# Patient Record
Sex: Male | Born: 1955 | Race: White | Hispanic: No | Marital: Married | Smoking: Former smoker
Health system: Southern US, Community
[De-identification: ages and names within clinical notes are randomized; demographics above are authoritative.]

## PROBLEM LIST (undated history)

## (undated) DIAGNOSIS — E119 Type 2 diabetes mellitus without complications: Secondary | ICD-10-CM

## (undated) DIAGNOSIS — K7460 Unspecified cirrhosis of liver: Secondary | ICD-10-CM

## (undated) DIAGNOSIS — I70219 Atherosclerosis of native arteries of extremities with intermittent claudication, unspecified extremity: Secondary | ICD-10-CM

## (undated) DIAGNOSIS — M1990 Unspecified osteoarthritis, unspecified site: Secondary | ICD-10-CM

## (undated) DIAGNOSIS — F329 Major depressive disorder, single episode, unspecified: Secondary | ICD-10-CM

## (undated) DIAGNOSIS — I10 Essential (primary) hypertension: Secondary | ICD-10-CM

## (undated) DIAGNOSIS — J40 Bronchitis, not specified as acute or chronic: Secondary | ICD-10-CM

## (undated) DIAGNOSIS — T8859XA Other complications of anesthesia, initial encounter: Secondary | ICD-10-CM

## (undated) DIAGNOSIS — Z8489 Family history of other specified conditions: Secondary | ICD-10-CM

## (undated) DIAGNOSIS — I5022 Chronic systolic (congestive) heart failure: Secondary | ICD-10-CM

## (undated) DIAGNOSIS — K219 Gastro-esophageal reflux disease without esophagitis: Secondary | ICD-10-CM

## (undated) DIAGNOSIS — T884XXA Failed or difficult intubation, initial encounter: Secondary | ICD-10-CM

## (undated) DIAGNOSIS — Z8614 Personal history of Methicillin resistant Staphylococcus aureus infection: Secondary | ICD-10-CM

## (undated) DIAGNOSIS — E785 Hyperlipidemia, unspecified: Secondary | ICD-10-CM

## (undated) DIAGNOSIS — F32A Depression, unspecified: Secondary | ICD-10-CM

## (undated) DIAGNOSIS — I2510 Atherosclerotic heart disease of native coronary artery without angina pectoris: Secondary | ICD-10-CM

## (undated) DIAGNOSIS — G4700 Insomnia, unspecified: Secondary | ICD-10-CM

## (undated) DIAGNOSIS — D649 Anemia, unspecified: Secondary | ICD-10-CM

## (undated) DIAGNOSIS — I209 Angina pectoris, unspecified: Secondary | ICD-10-CM

## (undated) DIAGNOSIS — R0600 Dyspnea, unspecified: Secondary | ICD-10-CM

## (undated) DIAGNOSIS — I509 Heart failure, unspecified: Secondary | ICD-10-CM

## (undated) DIAGNOSIS — Z974 Presence of external hearing-aid: Secondary | ICD-10-CM

## (undated) DIAGNOSIS — I255 Ischemic cardiomyopathy: Secondary | ICD-10-CM

## (undated) DIAGNOSIS — I739 Peripheral vascular disease, unspecified: Secondary | ICD-10-CM

## (undated) DIAGNOSIS — K259 Gastric ulcer, unspecified as acute or chronic, without hemorrhage or perforation: Secondary | ICD-10-CM

## (undated) DIAGNOSIS — Z9889 Other specified postprocedural states: Secondary | ICD-10-CM

## (undated) DIAGNOSIS — Z951 Presence of aortocoronary bypass graft: Secondary | ICD-10-CM

## (undated) HISTORY — PX: COLON SURGERY: SHX602

## (undated) HISTORY — DX: Family history of other specified conditions: Z84.89

## (undated) HISTORY — PX: OTHER SURGICAL HISTORY: SHX169

## (undated) HISTORY — DX: Other complications of anesthesia, initial encounter: T88.59XA

## (undated) HISTORY — PX: HAND TENDON SURGERY: SHX663

## (undated) HISTORY — PX: TONSILLECTOMY: SUR1361

## (undated) HISTORY — DX: Other specified postprocedural states: Z98.890

## (undated) HISTORY — PX: CORONARY ARTERY BYPASS GRAFT: SHX141

---

## 1999-09-12 ENCOUNTER — Encounter: Payer: Self-pay | Admitting: Emergency Medicine

## 1999-09-12 ENCOUNTER — Inpatient Hospital Stay (HOSPITAL_COMMUNITY): Admission: EM | Admit: 1999-09-12 | Discharge: 1999-09-16 | Payer: Self-pay | Admitting: Emergency Medicine

## 1999-10-22 ENCOUNTER — Ambulatory Visit (HOSPITAL_COMMUNITY): Admission: RE | Admit: 1999-10-22 | Discharge: 1999-10-22 | Payer: Self-pay | Admitting: Neurosurgery

## 1999-10-22 ENCOUNTER — Encounter: Payer: Self-pay | Admitting: Surgery

## 2001-02-18 ENCOUNTER — Encounter: Payer: Self-pay | Admitting: General Surgery

## 2001-02-18 ENCOUNTER — Inpatient Hospital Stay (HOSPITAL_COMMUNITY): Admission: EM | Admit: 2001-02-18 | Discharge: 2001-02-21 | Payer: Self-pay

## 2001-03-30 ENCOUNTER — Inpatient Hospital Stay (HOSPITAL_COMMUNITY): Admission: RE | Admit: 2001-03-30 | Discharge: 2001-04-04 | Payer: Self-pay | Admitting: Surgery

## 2001-03-30 ENCOUNTER — Encounter (INDEPENDENT_AMBULATORY_CARE_PROVIDER_SITE_OTHER): Payer: Self-pay

## 2002-01-26 ENCOUNTER — Encounter: Payer: Self-pay | Admitting: Emergency Medicine

## 2002-01-26 ENCOUNTER — Inpatient Hospital Stay (HOSPITAL_COMMUNITY): Admission: EM | Admit: 2002-01-26 | Discharge: 2002-01-27 | Payer: Self-pay | Admitting: Emergency Medicine

## 2002-11-25 HISTORY — PX: COLON SURGERY: SHX602

## 2004-12-03 ENCOUNTER — Inpatient Hospital Stay (HOSPITAL_COMMUNITY): Admission: EM | Admit: 2004-12-03 | Discharge: 2004-12-08 | Payer: Self-pay | Admitting: Emergency Medicine

## 2006-04-25 ENCOUNTER — Ambulatory Visit: Payer: Self-pay | Admitting: Family Medicine

## 2006-04-25 ENCOUNTER — Ambulatory Visit: Payer: Self-pay | Admitting: *Deleted

## 2006-05-15 ENCOUNTER — Ambulatory Visit: Payer: Self-pay | Admitting: Family Medicine

## 2006-06-11 ENCOUNTER — Ambulatory Visit: Payer: Self-pay | Admitting: Family Medicine

## 2006-08-12 ENCOUNTER — Ambulatory Visit: Payer: Self-pay | Admitting: Family Medicine

## 2006-09-15 ENCOUNTER — Ambulatory Visit: Payer: Self-pay | Admitting: Family Medicine

## 2006-09-15 ENCOUNTER — Ambulatory Visit (HOSPITAL_COMMUNITY): Admission: RE | Admit: 2006-09-15 | Discharge: 2006-09-15 | Payer: Self-pay | Admitting: Family Medicine

## 2007-02-16 ENCOUNTER — Ambulatory Visit: Payer: Self-pay | Admitting: Family Medicine

## 2007-04-06 ENCOUNTER — Ambulatory Visit: Payer: Self-pay | Admitting: Family Medicine

## 2007-04-24 ENCOUNTER — Ambulatory Visit: Payer: Self-pay | Admitting: Internal Medicine

## 2007-04-28 ENCOUNTER — Ambulatory Visit (HOSPITAL_COMMUNITY): Admission: RE | Admit: 2007-04-28 | Discharge: 2007-04-28 | Payer: Self-pay | Admitting: Family Medicine

## 2007-05-08 ENCOUNTER — Ambulatory Visit: Payer: Self-pay | Admitting: Family Medicine

## 2007-05-21 ENCOUNTER — Ambulatory Visit: Payer: Self-pay | Admitting: Internal Medicine

## 2007-07-08 ENCOUNTER — Emergency Department (HOSPITAL_COMMUNITY): Admission: EM | Admit: 2007-07-08 | Discharge: 2007-07-08 | Payer: Self-pay | Admitting: Emergency Medicine

## 2007-07-13 ENCOUNTER — Ambulatory Visit: Payer: Self-pay | Admitting: Internal Medicine

## 2007-07-22 ENCOUNTER — Ambulatory Visit: Payer: Self-pay | Admitting: Family Medicine

## 2007-07-23 DIAGNOSIS — M67919 Unspecified disorder of synovium and tendon, unspecified shoulder: Secondary | ICD-10-CM | POA: Insufficient documentation

## 2007-07-23 DIAGNOSIS — Z720 Tobacco use: Secondary | ICD-10-CM | POA: Insufficient documentation

## 2007-07-23 DIAGNOSIS — M25511 Pain in right shoulder: Secondary | ICD-10-CM | POA: Insufficient documentation

## 2007-07-23 DIAGNOSIS — M719 Bursopathy, unspecified: Secondary | ICD-10-CM

## 2007-07-23 DIAGNOSIS — I10 Essential (primary) hypertension: Secondary | ICD-10-CM | POA: Insufficient documentation

## 2007-07-23 DIAGNOSIS — F17200 Nicotine dependence, unspecified, uncomplicated: Secondary | ICD-10-CM

## 2007-07-23 DIAGNOSIS — K029 Dental caries, unspecified: Secondary | ICD-10-CM | POA: Insufficient documentation

## 2007-07-23 DIAGNOSIS — K219 Gastro-esophageal reflux disease without esophagitis: Secondary | ICD-10-CM | POA: Insufficient documentation

## 2007-07-24 DIAGNOSIS — I2510 Atherosclerotic heart disease of native coronary artery without angina pectoris: Secondary | ICD-10-CM | POA: Insufficient documentation

## 2007-08-12 ENCOUNTER — Encounter (INDEPENDENT_AMBULATORY_CARE_PROVIDER_SITE_OTHER): Payer: Self-pay | Admitting: *Deleted

## 2007-08-25 ENCOUNTER — Ambulatory Visit: Payer: Self-pay | Admitting: Family Medicine

## 2007-08-27 ENCOUNTER — Ambulatory Visit (HOSPITAL_COMMUNITY): Admission: RE | Admit: 2007-08-27 | Discharge: 2007-08-27 | Payer: Self-pay | Admitting: Family Medicine

## 2007-09-15 ENCOUNTER — Ambulatory Visit: Payer: Self-pay | Admitting: Family Medicine

## 2007-12-22 ENCOUNTER — Ambulatory Visit: Payer: Self-pay | Admitting: Family Medicine

## 2008-02-17 ENCOUNTER — Emergency Department (HOSPITAL_COMMUNITY): Admission: EM | Admit: 2008-02-17 | Discharge: 2008-02-17 | Payer: Self-pay | Admitting: Emergency Medicine

## 2008-04-01 ENCOUNTER — Ambulatory Visit: Payer: Self-pay | Admitting: Internal Medicine

## 2008-04-22 ENCOUNTER — Ambulatory Visit: Payer: Self-pay | Admitting: Internal Medicine

## 2008-05-23 ENCOUNTER — Ambulatory Visit: Payer: Self-pay | Admitting: Family Medicine

## 2008-05-23 LAB — CONVERTED CEMR LAB
ALT: 24 units/L (ref 0–53)
AST: 17 units/L (ref 0–37)
Albumin: 4.4 g/dL (ref 3.5–5.2)
Basophils Absolute: 0.1 10*3/uL (ref 0.0–0.1)
Basophils Relative: 1 % (ref 0–1)
Calcium: 8.8 mg/dL (ref 8.4–10.5)
Chloride: 107 meq/L (ref 96–112)
MCHC: 33.2 g/dL (ref 30.0–36.0)
Neutro Abs: 7.1 10*3/uL (ref 1.7–7.7)
Neutrophils Relative %: 58 % (ref 43–77)
PSA: 0.38 ng/mL (ref 0.10–4.00)
Potassium: 3.7 meq/L (ref 3.5–5.3)
RDW: 14 % (ref 11.5–15.5)
Total CHOL/HDL Ratio: 5.4

## 2008-11-25 DIAGNOSIS — Z8614 Personal history of Methicillin resistant Staphylococcus aureus infection: Secondary | ICD-10-CM

## 2008-11-25 HISTORY — DX: Personal history of Methicillin resistant Staphylococcus aureus infection: Z86.14

## 2009-04-18 ENCOUNTER — Ambulatory Visit: Payer: Self-pay | Admitting: Family Medicine

## 2009-09-22 ENCOUNTER — Ambulatory Visit: Payer: Self-pay | Admitting: Family Medicine

## 2009-09-22 LAB — CONVERTED CEMR LAB
ALT: 37 units/L (ref 0–53)
AST: 37 units/L (ref 0–37)
CO2: 20 meq/L (ref 19–32)
Chloride: 106 meq/L (ref 96–112)
PSA: 0.43 ng/mL (ref 0.10–4.00)
Sodium: 142 meq/L (ref 135–145)
Total Bilirubin: 0.6 mg/dL (ref 0.3–1.2)
Total Protein: 7.5 g/dL (ref 6.0–8.3)

## 2009-10-24 ENCOUNTER — Ambulatory Visit: Payer: Self-pay | Admitting: Family Medicine

## 2009-10-31 ENCOUNTER — Ambulatory Visit (HOSPITAL_COMMUNITY): Admission: RE | Admit: 2009-10-31 | Discharge: 2009-10-31 | Payer: Self-pay | Admitting: Family Medicine

## 2010-01-23 ENCOUNTER — Ambulatory Visit: Payer: Self-pay | Admitting: Family Medicine

## 2010-01-23 LAB — CONVERTED CEMR LAB
Amphetamine Screen, Ur: NEGATIVE
Barbiturate Quant, Ur: NEGATIVE
Cocaine Metabolites: NEGATIVE
Creatinine,U: 116.7 mg/dL
Marijuana Metabolite: POSITIVE — AB
Opiate Screen, Urine: NEGATIVE

## 2010-10-17 ENCOUNTER — Emergency Department: Payer: Medicare Other | Admitting: Emergency Medicine

## 2010-10-23 ENCOUNTER — Encounter (INDEPENDENT_AMBULATORY_CARE_PROVIDER_SITE_OTHER): Payer: Self-pay | Admitting: Family Medicine

## 2010-10-23 LAB — CONVERTED CEMR LAB
BUN: 16 mg/dL (ref 6–23)
CO2: 20 meq/L (ref 19–32)
Cholesterol: 165 mg/dL (ref 0–200)
Creatinine, Ser: 1.16 mg/dL (ref 0.40–1.50)
Glucose, Bld: 99 mg/dL (ref 70–99)
HDL: 26 mg/dL — ABNORMAL LOW (ref >39)
Total Bilirubin: 0.6 mg/dL (ref 0.3–1.2)
Total CHOL/HDL Ratio: 6.3
Triglycerides: 135 mg/dL (ref <150)
VLDL: 27 mg/dL (ref 0–40)

## 2010-11-29 ENCOUNTER — Emergency Department: Payer: Medicare Other | Admitting: Emergency Medicine

## 2011-02-25 ENCOUNTER — Emergency Department (HOSPITAL_COMMUNITY): Payer: Medicare Other

## 2011-02-25 ENCOUNTER — Emergency Department (HOSPITAL_COMMUNITY)
Admission: EM | Admit: 2011-02-25 | Discharge: 2011-02-26 | Disposition: A | Discharge status: Home / Self Care | Payer: Medicare Other | Attending: Emergency Medicine | Admitting: Emergency Medicine

## 2011-02-25 ENCOUNTER — Encounter (HOSPITAL_COMMUNITY): Payer: Self-pay | Admitting: Radiology

## 2011-02-25 DIAGNOSIS — Z951 Presence of aortocoronary bypass graft: Secondary | ICD-10-CM | POA: Insufficient documentation

## 2011-02-25 DIAGNOSIS — I252 Old myocardial infarction: Secondary | ICD-10-CM | POA: Insufficient documentation

## 2011-02-25 DIAGNOSIS — R630 Anorexia: Secondary | ICD-10-CM | POA: Insufficient documentation

## 2011-02-25 DIAGNOSIS — Z79899 Other long term (current) drug therapy: Secondary | ICD-10-CM | POA: Insufficient documentation

## 2011-02-25 DIAGNOSIS — R109 Unspecified abdominal pain: Secondary | ICD-10-CM | POA: Insufficient documentation

## 2011-02-25 DIAGNOSIS — I10 Essential (primary) hypertension: Secondary | ICD-10-CM | POA: Insufficient documentation

## 2011-02-25 DIAGNOSIS — R112 Nausea with vomiting, unspecified: Secondary | ICD-10-CM | POA: Insufficient documentation

## 2011-02-25 DIAGNOSIS — F430 Acute stress reaction: Secondary | ICD-10-CM | POA: Insufficient documentation

## 2011-02-25 HISTORY — DX: Essential (primary) hypertension: I10

## 2011-02-25 LAB — DIFFERENTIAL
Eosinophils Absolute: 0.1 10*3/uL (ref 0.0–0.7)
Eosinophils Relative: 0 % (ref 0–5)
Lymphocytes Relative: 10 % — ABNORMAL LOW (ref 12–46)
Lymphs Abs: 2.2 10*3/uL (ref 0.7–4.0)
Monocytes Relative: 4 % (ref 3–12)

## 2011-02-25 LAB — URINALYSIS, ROUTINE W REFLEX MICROSCOPIC
Bilirubin Urine: NEGATIVE
Nitrite: NEGATIVE
Specific Gravity, Urine: <1.005 — ABNORMAL LOW (ref 1.005–1.030)
Urobilinogen, UA: 1 mg/dL (ref 0.0–1.0)
pH: 7 (ref 5.0–8.0)

## 2011-02-25 LAB — CBC
HCT: 47.7 % (ref 39.0–52.0)
HCT: 45.2 % (ref 39.0–52.0)
Hemoglobin: 16.7 g/dL (ref 13.0–17.0)
MCH: 32.5 pg (ref 26.0–34.0)
MCH: 33.3 pg (ref 26.0–34.0)
MCHC: 36.9 g/dL — ABNORMAL HIGH (ref 30.0–36.0)
MCV: 90.2 fL (ref 78.0–100.0)
MCV: 90 fL (ref 78.0–100.0)
Platelets: 253 10*3/uL (ref 150–400)
Platelets: 290 10*3/uL (ref 150–400)
RBC: 5.02 MIL/uL (ref 4.22–5.81)
RDW: 12.4 % (ref 11.5–15.5)
RDW: 12.6 % (ref 11.5–15.5)
WBC: 21.2 10*3/uL — ABNORMAL HIGH (ref 4.0–10.5)

## 2011-02-25 LAB — COMPREHENSIVE METABOLIC PANEL
AST: 61 U/L — ABNORMAL HIGH (ref 0–37)
Albumin: 4.5 g/dL (ref 3.5–5.2)
Alkaline Phosphatase: 89 U/L (ref 39–117)
BUN: 9 mg/dL (ref 6–23)
CO2: 28 mEq/L (ref 19–32)
Chloride: 99 mEq/L (ref 96–112)
Creatinine, Ser: 1.18 mg/dL (ref 0.4–1.5)
GFR calc Af Amer: >60 mL/min (ref >60)
GFR calc non Af Amer: >60 mL/min (ref >60)
Potassium: 2.7 mEq/L — CL (ref 3.5–5.1)
Total Bilirubin: 1 mg/dL (ref 0.3–1.2)

## 2011-02-25 LAB — BASIC METABOLIC PANEL
BUN: 10 mg/dL (ref 6–23)
CO2: 26 mEq/L (ref 19–32)
Calcium: 8.9 mg/dL (ref 8.4–10.5)
Chloride: 100 mEq/L (ref 96–112)
Creatinine, Ser: 0.98 mg/dL (ref 0.4–1.5)
GFR calc Af Amer: >60 mL/min (ref >60)
GFR calc non Af Amer: >60 mL/min (ref >60)
Glucose, Bld: 136 mg/dL — ABNORMAL HIGH (ref 70–99)
Potassium: 3 mEq/L — ABNORMAL LOW (ref 3.5–5.1)
Sodium: 136 mEq/L (ref 135–145)

## 2011-02-25 LAB — CK TOTAL AND CKMB (NOT AT ARMC)
CK, MB: 1.8 ng/mL (ref 0.3–4.0)
Relative Index: 1.2 (ref 0.0–2.5)
Total CK: 154 U/L (ref 7–232)

## 2011-02-25 LAB — OCCULT BLOOD, POC DEVICE: Fecal Occult Bld: NEGATIVE

## 2011-02-25 LAB — POCT CARDIAC MARKERS: Myoglobin, poc: 230 ng/mL (ref 12–200)

## 2011-02-25 LAB — TROPONIN I: Troponin I: 0.03 ng/mL (ref 0.00–0.06)

## 2011-02-25 MED ORDER — IOHEXOL 300 MG/ML  SOLN
80.0000 mL | Freq: Once | INTRAMUSCULAR | Status: AC | PRN
  Administered 2011-02-25: 80 mL via INTRAVENOUS

## 2011-02-26 LAB — URINE CULTURE
Colony Count: 4000
Culture  Setup Time: 201204022335

## 2011-04-12 NOTE — Op Note (Signed)
[facility]  Patient:    [name], [name]                    MRN: [id number] Adm. Date:  [id number] Attending:  Charlton Haws                           Operative Report  ACCOUNT NUMBER:  [id number]  OFFICE MEDICAL RECORD NUMBER:  [id number] [phone]  PREOPERATIVE DIAGNOSIS:  Chronic sigmoid diverticulitis.  POSTOPERATIVE DIAGNOSIS:  Chronic sigmoid diverticulitis.  OPERATION:  Sigmoid colectomy and incidental appendectomy.  SURGEON:  Currie Paris, M.D.  ASSISTANT:  Milus Mallick, M.D.  ANESTHESIA:  General endotracheal.  CLINIC HISTORY:  This patient is a 55 year old gentleman with recurring bouts of diverticulitis documented by physical and CT scanning, who recently was hospitalized with another episode.  Barium enema confirmed diverticular disease.  The barium enema had been done back in November with one of his other episodes.  After this current episode had cooled down, he was admitted electively following home bowel prep for a sigmoid colectomy.  DESCRIPTION OF PROCEDURE:  The patient was brought to the operating room having been seen and identified in the holding area.  After satisfactory general endotracheal anesthesia had been obtained, a Foley catheter was placed, and the abdomen prepped and draped.  A midline incision was made from just below the umbilicus to the symphysis and the abdominal cavity entered. We did not really have adequate access and exposure with this, so I extended the incision to just above the umbilicus.  A wound protector was placed, and a self-retaining retractor placed.  The liver appeared to be normal.  The gallbladder appeared to be decompressed, and I could not feel any stones in it.  The small bowel was normal.  The appendix was a little high riding and tucked medially and at this point, I went ahead and grasped it with a Babcock to perform an appendectomy.  The mesoappendix was divided with clamps  and tied with 2-0 silk with a suture of 2-0 silk.  The base of the appendix was clamped once and tied with an 0 chromic and amputated.  A 3-0 silk pursestring was placed just prior to amputating it and the stump of the appendix inverted.  The colon appeared to be normal until we got to the sigmoid where there was an area of chronic inflammatory changes, thickening, and what appeared to be some narrowing in the mid sigmoid, consistent with the area that had had prior diverticulitis.  At this point, I freed up the sigmoid by the size in the white line of Toldt until I had it well open.  The left ureter was identified and was well posterior.  I selected a site at the rectosigmoid junction where the tenia had disappeared, and I saw no evidence of any diverticular disease for the distal site and put a suture there to mark it.  I then went proximal to the area of inflammatory changes and picked an area at about the junction of the descending sigmoid colon for the proximal division.  The colon here appeared relatively normal with no evidence of active diverticulitis nor any diverticula noted here.  I made a window in the mesentery at the proximal site and put on an Blaine and a Kocher and divided the colon.  The mesentery was then divided going from that point down to the distal site, primarily using clamps and the  Ligasure device or clamps and suture ligatures as indicated for the larger vessels.  The colon was cleaned off distally, and a Satinsky clamp used as a cutting guide and the bowel divided here and sent to pathology.  The pathologist subsequently reported that this looked to be diverticular disease with no evidence of malignancy.  The two ends crossed over easily, so there was no tension.  A hand-sewn anastomosis was done with 3-0 silk a through-and-through for a posterior layer and then inverting the anterior row until we got to the very middle where some Gambee sutures were  used to close the remaining opening.  This produced an adequate closure that easily admitted a finger and half cross, and there was no tension.  The mesenteric defect was closed with 3-0 silks.  The area was irrigated, and hemostasis appeared complete.  The instruments were changed; gloves were changed, and a final irrigation of the abdomen was carried out.  The abdomen was then closed with some 0 PDS on the peritoneum below the umbilicus and then running #1 PDS on the fascia.  The skin and subcutaneous tissues were irrigated and appeared dry.  The skin was closed with staples.  The patient tolerated the procedure well.  There were no operative complications.  All counts were correct.  Estimated blood loss was 200 cc. DD:  03/30/01 TD:  03/28/01 Job: [id number]

## 2011-04-12 NOTE — Discharge Summary (Signed)
[name], [name]             ACCOUNT NO.:  [id number]   MEDICAL RECORD NO.:  [id number]          PATIENT TYPE:  INP   LOCATION:  2035                         FACILITY:  [facility]   PHYSICIAN:  Salvatore Decent. Cornelius Moras, M.D. DATE OF BIRTH:  June 27, 1956   DATE OF ADMISSION:  12/02/2004  DATE OF DISCHARGE:  12/10/2004                                 DISCHARGE SUMMARY   HISTORY OF PRESENT ILLNESS:  This is a 55 year old male who was admitted to  [facility] for evaluation of chest pain.  The patient was  hospitalized at [facility] in March of 2003 for chest pain.  At  that time, he underwent cardiac catheterization.  This demonstrated  nonobstructive coronary artery disease including mild diffuse 3 vessel  coronary artery disease with a 20% left main stenosis, 20-40% right coronary  artery disease, 20-30% LAD disease, and 10-20% circumflex disease.  The  patient was counseled on smoking cessation, lifestyle modification, and  dyslipidemia management including medicines.  The patient unfortunately  implemented none of the above recommendations and continued to smoke one  pack of cigarettes per day.  In addition, he does not take his dyslipidemia  medications.  He presented to the emergency department with a history of  chest pain that began approximately 36 hours previous.  The chest pain was  described as sharp, flashing discomfort located in the left anterior chest.  It radiated down the left arm.  It was associated with mild dyspnea and mild  diaphoresis but no nausea.  The chest pain continued in a low grade fashion  over the course of time and there was no noted exacerbating or ameliorating  factors.  It did not appear to be related to position, activity, meals, or  respirations.  He did not take any nitroglycerin.  Pain was mostly resolved  at presentation to the emergency department and it was felt as though it was  very similar to his previous episode in 2003.  The patient  has no history of  MI, congestive heart failure, or arrhythmia.  He was felt to require  admission for further evaluation and treatment to rule out unstable angina.   PAST MEDICAL HISTORY:  Notable for partial colectomy for diverticulitis in  2002.   MEDICATIONS ON ADMISSION:  None.   ALLERGIES:  Penicillin.   OTHER SURGERIES:  Cervical spine surgery.   FAMILY/SOCIAL HISTORY/PHYSICAL EXAM:  Please see the history and physical  done at the time of admission.   HOSPITAL COURSE:  The patient was admitted with symptoms of unstable angina.  He was admitted for telemetry and serial cardiac enzymes, given aspirin,  placed on intravenous nitroglycerin and heparin, and other measures as  delineated per the chart.  It was felt that the patient would require  cardiac catheterization to better delineate his anatomy.  His enzymes were  negative for myocardial infarction.  The catheterization was undertaken on  December 03, 2004.  Findings included what was felt to be a ostial left main  coronary lesion in the range of 70% or higher.  The LAD had a 50% proximal  lesion.  The left circumflex was felt to be normal.  The right coronary  artery had scattered 30% lesions.  Due to these findings, a laboratory  consultation was obtained with Dr. Cornelius Moras who felt the patient would not  require emergent surgery but on full consultation, it was recommended to  proceed with elective surgical revascularization.   PROCEDURE:  On December 05, 2004, the patient was taken to the operating room  where he underwent the following procedure: Coronary artery bypass grafting  x2.  The following grafts were placed:  1.  A left internal mammary artery to the distal left anterior descending      coronary artery.  2.  A left radial artery to the second circumflex marginal branch.   The procedure was performed by Tressie Stalker, M.D.  The patient tolerated  the procedure well and was taken to the surgical intensive care unit  in  stable condition.   POSTOPERATIVE HOSPITAL COURSE:  The patient has done quite well.  He has  maintained stable hemodynamics.  He has had excellent urine output and  required only a gentle diuresis.  He was weaned from the ventilator without  difficulty.  Supplemental oxygen has been weaned and he maintains good  oxygen saturations on room air.  His laboratory values are quite stable.  Most recent hemoglobin and hematocrit are 12.2 and 35 respectively on  December 07, 2004.  His white blood cell count is elevated at 15.8 on the  same day.  His chest x-ray shows mild atelectasis.  He is not felt to have  any clinical evidence of surgical infection.  His incisions are healing well  without signs of infection.  His left arm is neurovascularly intact.  His  telemetry is maintaining normal sinus rhythm without significant cardiac  dysrhythmias or ectopy.  He remains afebrile.  His overall status is felt to  be tentatively stable for discharge in the next 1-2 days pending morning  round re-evaluation.   MEDICATIONS ON DISCHARGE:  1.  Aspirin 325 mg daily.  2. Lopressor 25 mg b.i.d.  3. Lipitor 10 mg daily      for pain.  4. Tylox 1-2 q.4-6h p.r.n. as needed.   DISCHARGE INSTRUCTIONS:  The patient received written instructions regarding  medications, activity, diet, wound care, and followup.  Followup will  include Dr. Cornelius Moras on January 28, 2005 at 2:15.  He is also instructed to see Dr.  Deborah Chalk in 2 weeks with chest x-ray at that time.   FINAL DIAGNOSIS:  Severe left main coronary artery disease.  Presentation  with unstable angina.   OTHER DIAGNOSES:  As previously listed for the history.      [name]   WEG/MEDQ  D:  12/07/2004  T:  12/07/2004  Job:  [id number]   cc:   Salvatore Decent. Cornelius Moras, M.D.  [address]  [place]  [place] [address]   Colleen Can. Deborah Chalk, M.D.  Fax: [phone]

## 2011-04-12 NOTE — Discharge Summary (Signed)
[place]  Patient:    [name], [name] Visit Number: [id number] MRN: [id number]          Service Type: MED Location: [phone] Attending Physician:  Eleanora Neighbor Dictated by:   Jennet Maduro Earl Gala, R.N., A.N.P. Admit Date:  01/26/2002 Discharge Date: 01/27/2002   CC:         [name], M.D.   Discharge Summary  PRIMARY DISCHARGE DIAGNOSES: 1. Chest pain most likely secondary to gastroesophageal reflux disease. 2. Hyperlipidemia, now initiated on statin therapy. 3. Ongoing tobacco abuse.  HISTORY OF PRESENT ILLNESS:  [name] is a 55 year old white male who was referred to the emergency department today from his primary care physicians office after having prolonged episodes of chest discomfort. He has had the original onset this past Sunday and then it recurred again on Monday.  He was seen and evaluated in the emergency room and was still complaining of chest tightness and was subsequently admitted for further evaluation.  Please see the dictated history and physical for further patient presentation and profile.  LABORATORY DATA ON ADMISSION: Total cholesterol was 192, triglycerides 89, HDL 33, LDL 141.  CBC on admission showed hemoglobin 16, hematocrit 46, white count 10,000, platelets 239. Chemistries on admission were within normal limits.  PT and PTT were unremarkable.  LFTs were normal. Cardiac enzymes were negative x3 with negative troponin. Calcium level was normal.  HOSPITAL COURSE:  The patient was admitted to telemetry.  He ruled out negative for myocardial infarction per serial enzymes.  He was maintained on nitrate therapy as well as IV heparin and we preceded on with coronary angiography the following morning. That procedure was tolerated well with any known problems.  He did have Perclose and received 1 g of vancomycin post procedure.  The left ventricular function was noted to be normal.  There  are scattered 20 to 30% narrowings throughout the right coronary.  Left anterior descending with mild irregularities of the left circumflex. There was a mild 20% ostial narrowing of the left main and nitroglycerin was subsequently given and felt to be secondary to mild catheter damping. An abdominal aortogram was performed which showed mild irregularities as well.  Post procedure, he was transferred back to 6700. Plans will now be made for him to be discharged later on this evening with further evaluation as an outpatient.  His cardiac catheterization results feel that he does not suffer from obstructive coronary disease and the patient will be treated with aggressive risk factor modification and proton pump inhibitor therapy.  CONDITION ON DISCHARGE:  Stable.  DISCHARGE MEDICATIONS: 1. Lipitor 10 mg a day. 2. Protonix 40 mg a day. 3. Aspirin daily. 4. Plavix 75 mg a day for one month.  ACTIVITY:  This is to be light for the rest of this week. He is not to return to work until next Monday.  DISCHARGE INSTRUCTIONS: He is strongly encouraged not to smoke.  FOLLOW-UP:  He is to follow up with the nurse practitioner next Friday on February 05, 2002, at 10 a.m. and to call our office if any problems arise. Dictated by:   Jennet Maduro Earl Gala, R.N., A.N.P. Attending Physician:  Eleanora Neighbor DD:  01/27/02 TD:  01/28/02 Job: [id number]

## 2011-04-12 NOTE — H&P (Signed)
NAMEWENZEL, BACKLUND NO.:  [id number]   MEDICAL RECORD NO.:  [id number]          PATIENT TYPE:  EMS   LOCATION:  MAJO                         FACILITY:  [facility]   PHYSICIAN:  Ulyses Amor, MD DATE OF BIRTH:  1956/02/03   DATE OF ADMISSION:  12/02/2004  DATE OF DISCHARGE:                                HISTORY & PHYSICAL   CARDIOLOGY ADMISSION NOTE   [name] is a 55 year old white man who was admitted to [facility] for further evaluation of chest pain.   The patient was hospitalized at [facility] in March 2003 for chest  pain.  At that time, he underwent cardiac catheterization.  This  demonstrated nonobstructive coronary artery disease, including mild,  diffuse, three-vessel coronary artery disease with a 20% left main stenosis,  20-40% right coronary artery disease, 20-30% LAD disease, and 10-20%  circumflex disease.  The patient was counseled on smoking cessation,  lifestyle modification, and dyslipidemia management (including medications).   Since then, the patient has implemented none of the above recommendations.  He has continued to smoke one pack of cigarettes a day.  In addition, he is  not taking his dyslipidemia medication.   The patient presented to the emergency department with a history of chest  pain which began 36 hours ago.  Chest pain described as a sharp, flashing  discomfort located in the left anterior chest.  It radiates down the left  arm.  It has been associated with mild dyspnea and mild diaphoresis but no  nausea.  The discomfort has continued in a low-grade fashion over the course  of this time.  There are no exacerbating or ameliorating factors.  It  appears not to be related to position, activity, meals, or respirations.  He  has not taken any nitroglycerin.  It has largely resolved at this time.  He  believes that this chest pain is the same as that which prompted his  admission for chest pain in  2003.   There is no history of myocardial infarction, congestive heart failure, or  arrhythmia.   The patient's past medical history is notable for a partial colectomy for  diverticulitis in 2002.   There is no history of hypertension or diabetes mellitus.  There is a family  history known cardiac disease (his sister).   MEDICATIONS:  None.   ALLERGIES:  PENICILLIN.   SIGNIFICANT INJURIES:  None.   OPERATIONS:  In addition to the partial colectomy noted above, the patient  has undergone surgery on his cervical spine.   SOCIAL HISTORY:  The patient lives with his wife.  He works as a Presenter, broadcasting.  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  He smokes as noted above.  He does not drink.   FAMILY HISTORY:  His father died of liver cancer; he had a history of  alcoholism and diabetes.  His mother died of unknown cancer.  One sister has  heart disease as described above.  The other sister is well.   REVIEW OF SYSTEMS:  Review of systems  reveals no new problems related to his  head, eyes, ears, nose, mouth, throat, lungs, gastrointestinal system,  genitourinary system, or extremities.  There is no history of neurologic or  psychiatric disorder.  There is no history of fever, chills, or weight loss.   PHYSICAL EXAMINATION:  VITAL SIGNS:  Blood pressure 119/69, pulse 53 and  regular, respirations 18, and temperature 98.5.  GENERAL:  The patient was an obese, middle-aged white male in no discomfort.  He was alert, oriented, appropriate, and responsive.  HEENT:  Head, eyes, nose, and mouth were normal.  NECK:  The neck was without thyromegaly or adenopathy.  Carotid pulses were  palpable bilaterally and without bruits.  CARDIAC:  Normal S1 and S2.  There was no S3, S4, murmur, rub, or click.  Cardiac rhythm was regular.  No chest wall tenderness was noted.  LUNGS:  Clear.  ABDOMEN:  Soft and nontender.  There was no mass, hepatosplenomegaly,  bruit,  distention, rebound, guarding, or rigidity.  Bowel sounds were normal.  GU:  Rectal and genital examinations were not performed as they were not  pertinent to the reason for acute care hospitalization.  EXTREMITIES:  Without edema, deviation, or deformity.  Radial and dorsalis  pedis pulses were palpable bilaterally.  NEUROLOGIC:  Brief screening neurologic survey was unremarkable.   The electrocardiogram was normal.  A chest radiograph, according to the  radiologists, was normal.  Initial set of cardiac markers revealed a  myoglobin of 41.0, CK-MB 1.1, and troponin less than 0.05.  The remaining  studies were pending at the time of this dictation.   IMPRESSION:  1.  Chest pain; rule out unstable angina.  2.  Nonobstructive coronary artery disease by cardiac catheterization in      2003.  Details described above.  3.  Dyslipidemia.  4.  Status post partial colectomy for diverticulitis in 2002.  Noncompliant      with dyslipidemia medications.   PLAN:  1.  Telemetry.  2.  Serial cardiac enzymes.  3.  Aspirin.  4.  Intravenous nitroglycerin.  5.  Intravenous heparin.  6.  No beta blocker as heart rate is less than 60.  7.  Fasting lipid profile.  8.  Discontinuation of smoking discussed with the patient.  9.  Further measures per Dr. Deborah Chalk.      Mitc   MSC/MEDQ  D:  12/03/2004  T:  12/03/2004  Job:  [id number]   cc:   Colleen Can. Deborah Chalk, M.D.  Fax: [phone]

## 2011-04-12 NOTE — Discharge Summary (Signed)
[facility]  Patient:    [name], [name]                    MRN: [id number] Adm. Date:  [id number] Disc. Date: [id number] Attending:  Charlton Haws                           Discharge Summary  Account #:  [id number]  Office medical record #:  [id number]  FINAL DIAGNOSIS:  Acute and chronic diverticulitis.  CLINICAL HISTORY:  This patient is a 55 year old gentleman who has had multiple episodes of acute diverticulitis at least two of which required hospitalization.  After the most recent episode had quieted down, we admitted him for elective sigmoidectomy.  HOSPITAL COURSE:  The patient was admitted and taken to the operating room where his sigmoid colon was removed and primary anastomosis done.  Grossly, he appeared to have chronic diverticulitis and, pathologically, this was confirmed along with a small area of acute diverticulitis and a small peridiverticular abscess.  Incidental appendectomy was also performed.  Postoperatively, he did well although he had a fair amount of itching on his postoperative Dilaudid which was switched to morphine.  We did not use an NG tube and he remained nondistended.  He had a low-grade temperature to 100.5 but was able to ambulate.  His abdomen remained soft and his wound clean.  By the third postoperative day, he was able to start clear liquids and, on the fourth postoperative day, advanced to full liquids which he has tolerated nicely.  He is having minimal pain, ambulating, having bowel movements, and felt able to be discharged on the fifth postoperative day.  Lungs were clear, his abdomen was soft, and his wound was clean without evidence of any infection.  Laboratory studies on this admission included normal electrolytes although his nonfasting glucose was 148.  Hemoglobin was 15.7 and white count was 8600. Pathology did show acute and chronic diverticulitis associated with peridiverticular abscess and  a normal appendix.  The patient is discharged on Tylox for pain, limited activities, follow up in my office in one to two weeks.  He is in satisfactory condition. DD:  04/04/01 TD:  04/06/01 Job: [id number]
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
## 2011-04-12 NOTE — Cardiovascular Report (Signed)
[place]  Patient:    [name], [name] Visit Number: [id number] MRN: [id number]          Service Type: MED Location: [phone] Attending Physician:  Eleanora Neighbor Dictated by:   Colleen Can. Deborah Chalk, M.D. Admit Date:  01/26/2002 Discharge Date: 01/27/2002   CC:         Antony Madura, M.D.   Cardiac Catheterization  HISTORY:  [name] is a 55 year old male referred to the emergency room for evaluation of prolonged chest pain.  He has a history of diverticulitis and a history of C4 surgery.  He has hypercholesterolemia.  He has a history of cigarette abuse of two packs of cigarettes a day.  PROCEDURE:  Left heart catheterization with selective coronary angiography, left ventricular angiography, abdominal aortogram, and Perclose.  TYPE AND SITE OF ENTRY:  Percutaneous right femoral artery with Perclose.  CATHETERS:  The #6 [place] 4-curved Judkins right and left coronary catheters, #6 French pigtail ventriculographic catheter.  CONTRAST MATERIAL:  Omnipaque.  MEDICATIONS GIVEN PRIOR TO PROCEDURE:  Valium 10 mg p.o.  MEDICATIONS GIVEN DURING PROCEDURE:  Versed 3 mg IV.  COMMENTS:  The patient tolerated the procedure well.  HEMODYNAMIC DATA:  The aortic pressure was 108/87.  LV was 108/16-25.  There was no aortic valve gradient noted on pullback.  ANGIOGRAPHIC DATA:  LEFT VENTRICULAR ANGIOGRAM:  Left ventricular angiogram was performed in the RAO position.  Overall cardiac size and silhouette are normal.  Left ventricular function is normal with ejection fraction of 55-60%.  CORONARY ARTERIES:  The coronary arteries arise and distribute normally.  1. The left main coronary artery has 20% proximal narrowing and mild    irregularities.  There does appear to be enough irregularities to feel    confident that there is some degree of atherosclerosis present.  2. The left anterior descending is a reasonably large vessel  that crosses the    apex.  There is a high diagonal vessel that is free of significant    atherosclerosis.  Proximally, there is scattered 20-30% narrowings    throughout the proximal one-half of the left anterior descending.  The    left anterior descending wraps around the apex.  There is mild 20%    narrowing involving the diagonal vessels.  3. The left circumflex has minimal proximal disease.  It otherwise is    essentially normal but with scattered irregularities.  4. The right coronary artery is a dominant vessel.  There is 20-40%    narrowings in two places in the right coronary artery.  There are mild    irregularities scattered throughout and at the crux, there is 20-40%    narrowing.  The distal vessels are free of significant disease.  OVERALL IMPRESSION: 1. Normal left ventricular function. 2. Mild, clearly diffuse, three-vessel coronary atherosclerosis with 20% left    main stenosis, 20-40% right coronary artery, 20-30% left anterior    descending, and 10-20% left circumflex disease.  DISCUSSION:  It is clear that [name] does have some degree of atherosclerosis that is somewhat diffusely involving his coronary arteries. At this time, it is hard to imagine that he has any significant degree of obstructive coronary atherosclerosis that needs to be managed aggressively because of the diffuseness of his disease.  Because of some difficulty passing catheters in the distal aorta, abdominal aortogram was performed which showed no significant obstructive disease but once again, there is mild distal aorta atherosclerosis.  The  renal arteries were free of significant disease. Dictated by:   Colleen Can Deborah Chalk, M.D. Attending Physician:  Eleanora Neighbor DD:  01/27/02 TD:  01/27/02 Job: [id number]

## 2011-04-12 NOTE — Op Note (Signed)
[name], [name]             ACCOUNT NO.:  [id number]   MEDICAL RECORD NO.:  [id number]          PATIENT TYPE:  INP   LOCATION:  2309                         FACILITY:  [facility]   PHYSICIAN:  Salvatore Decent. Cornelius Moras, M.D. DATE OF BIRTH:  Jan 19, 1956   DATE OF PROCEDURE:  12/05/2004  DATE OF DISCHARGE:                                 OPERATIVE REPORT   PREOPERATIVE DIAGNOSIS:  Left main disease.   POSTOPERATIVE DIAGNOSIS:  Left main disease.   PROCEDURE:  Median sternotomy for coronary artery bypass grafting x2 (left  internal mammary artery to distal left anterior descending coronary artery,  left radial artery to second circumflex marginal branch).   SURGEON:  Salvatore Decent. Cornelius Moras, M.D.   ASSISTANT:  Rowe Clack, P.A.-C.   ANESTHESIA:  General.   BRIEF CLINICAL NOTE:  The patient is a 55 year old male with history of  coronary artery disease, hyperlipidemia and longstanding tobacco abuse. The  patient presents with symptoms consistent with unstable angina. He was  admitted to the hospital and ruled out for acute myocardial infarction. He  underwent cardiac catheterization by Dr. Delfin Edis on [place].  Catheterization demonstrates ostial stenosis of the left main coronary  artery with normal left ventricular function. A full consultation note has  been dictated previously.   OPERATIVE CONSENT:  The patient and his family have been counseled at length  regarding the indications and potential benefits of coronary artery bypass  grafting. Alternative treatment strategies been discussed. In particular,  the fact that the patient's left main coronary stenosis is not critical or  high-grade, they understand the possibility that he might be a risk for  premature graft failure due to competitive blood flow through the native  coronary circulation. Nonetheless, the patient's symptoms are felt to be  consistent with unstable angina and the patient clearly has significant  ostial stenosis  of the left main coronary artery. As such, elective coronary  artery bypass grafting was felt to be the best option for long-term  management. They understand and accept all associated risks of surgery  including but not limited to risk of death, stroke, myocardial infarction,  congestive heart failure, respiratory failure, pneumonia, bleeding requiring  blood transfusion, arrhythmia, infection, and recurrent coronary artery  disease. All their questions have been addressed.   DESCRIPTION OF PROCEDURE:  The patient was brought to the operating room and  above-mentioned date and central monitoring was established by the  anesthesia service under the care and direction of Dr. Judie Petit.  Specifically, a Swan-Ganz catheter was placed through the right internal  jugular approach. A right radial arterial line was placed. Intravenous  antibiotics were administered. Prior to induction of anesthesia, the  patient's left hand was again carefully examined. A pulse oximetry probe was  placed on his left index finger. The pulse oximetry waveform was monitored  while the left radial pulse was intermittently obliterated. This confirms  the presence of intact palmar arch circulation with no significant change in  the pulse oximetry waveform despite obliteration of the radial pulse.   General endotracheal anesthesia was induced uneventfully under the care and  direction of Dr. Randa Evens. A Foley catheter was placed. The patient's chest,  left upper extremity, abdomen, both groins, and both lower extremities were  prepared and draped in sterile manner.   Baseline transesophageal echocardiogram was performed by Dr. Randa Evens. This  demonstrates normal left ventricular function. All of the valves appeared  normal with no significant abnormalities appreciated.   A median sternotomy incision was performed in the left internal mammary  artery was dissected from the chest wall and prepared for bypass  grafting.  The left internal mammary artery is slightly small caliber but otherwise  good quality with excellent flow. Simultaneously, the left radial artery was  harvested through a longitudinal incision made along the volar aspect of the  left forearm. The artery was dissected from associated structures and all  intervening side branches of the artery and its associated veins were  divided with the Harmonic scalpel. Care was taken to avoid the superficial  branch of the left radial nerve during the dissection. The artery was  mobilized from just below the antecubital fossa to just above the wrist. The  patient was heparinized systemically. Subsequently the distal end of the  radial artery was transected and the distal stump oversewn with suture  ligature. The proximal end of the artery was then transected and the  proximal stump oversewn with suture ligature. The radial artery conduit was  placed in the small container of the patient's heparinized blood for  storage. The forearm incision was irrigated with saline solution, inspected  for hemostasis, and subsequently closed with a standard closure of one layer  running substitute subcutaneous sutures with subsequent subcuticular skin  closure as a second layer. The radial artery is notably good quality  conduit.   The pericardium was opened. The ascending aorta is normal in appearance. The  ascending aorta and the right atrium were cannulated for cardiopulmonary  bypass. Adequate heparinization verified. Cardiopulmonary bypass was begun  and the surface of the heart was inspected. The left ventricle is  essentially normal in appearance with mild left ventricular hypertrophy. The  distal branches of the left circumflex coronary artery are quite small but  they appear to be free of any significant atherosclerosis. The left radial  artery and the left internal mammary artery were both trimmed to appropriate lengths. A temperature probe was  placed in the left ventricular septum.  Cardioplegic catheter was placed in the ascending aorta.   The patient is allowed to cool passively to 32 degrees systemic temperature.  The aortic crossclamp was applied and cardioplegia is delivered in antegrade  fashion through the aortic root.  Iced saline slush was applied for topical  hypothermia. Initial cardioplegic arrest and myocardial cooling were felt to  be excellent. Repeat doses of cardioplegia were administered intermittently  throughout the crossclamp portion of the operation through the aortic root  and down the subsequently placed radial artery graft to maintain septal  temperature below 15 degrees centigrade. The following distal coronary  anastomoses were performed:  (1)  The second circumflex marginal branch was  grafted with the left radial artery in end-to-side fashion. This vessel was  the largest branch off the distal left circumflex coronary artery. It  measures only 1.0 mm in diameter but it is free of any atherosclerotic  disease. Of note, the distal branches of the left circumflex coronary artery  are so small that I would not likely favor an attempted repeat grafting in  the future should the patient ever require repeat coronary artery  bypass  grafting.  (2)  The distal left anterior descending coronary artery was  grafted with the left internal mammary artery in end-to-side fashion. This  vessel measures 1.8 mm in diameter and is of good quality at the site of  distal bypass.   The single proximal anastomosis of the left radial artery was constructed  directly to the ascending aorta prior to removal of the aortic crossclamp.  The left ventricular septal temperature rises rapidly following reperfusion  of the left internal mammary artery.  The aortic crossclamp was removed  after total crossclamp time of 57 minutes.   The heart began to beat spontaneously without need for cardioversion. The  proximal and both distal  coronary anastomoses were inspected for hemostasis  and appropriate graft orientation. Epicardial pacing wires were fixed to the  right ventricular outflow tract. The patient was rewarmed to 37 degrees  centigrade temperature. The patient weaned from cardiopulmonary bypass  without difficulty. The patient's rhythm at separation from bypass is normal  sinus rhythm. No inotropic support is required. Total cardiopulmonary bypass  time the operation is 69 minutes.   Follow-up transesophageal echocardiogram performed by Dr. Randa Evens after  separation from bypass demonstrates normal left ventricular function with no  other abnormalities.   The venous and arterial cannulae were removed uneventfully. Protamine was  administered to reverse the anticoagulation. The mediastinum and left chest  were irrigated with saline solution containing vancomycin. Meticulous  surgical hemostasis was ascertained.  The On-Q pain management system was utilized for subsequent postoperative  pain control. Two separate 10 inch Silastic catheters  supplied by the On-Q  manufacturer were placed through separate small stab incisions made along  the costal margin and positioned such that the catheters were located just  lateral to the lateral border of the sternum in the subcutaneous tissues  immediately anterior to the costal margin oriented such that the catheters  were parallel to the orientation of the median sternotomy incision. After  each of these catheters were placed, they were secured to the skin with silk  sutures and subsequently flushed with 5 cc of 0.5% bupivacaine solution.  After completion of the operation each of these catheters were then  connected to the On-Q continuous infusion pump which was prefilled with 0.5%  plain bupivacaine solution.   The median sternotomy was closed with double-strength sternal wires. The  soft tissues anterior to the sternum were closed in multiple layers and the  skin  was closed with a running subcuticular skin closure.   The patient tolerated the procedure well and was transported to the surgical  intensive care unit in stable condition. There were no intraoperative  complications. All sponge, instrument and needle counts were verified  correct at completion of the operation. No blood products were administered.      Clar   CHO/MEDQ  D:  12/05/2004  T:  12/05/2004  Job:  [id number]   cc:   Colleen Can. Deborah Chalk, M.D.  Fax: [phone]   Antony Madura, M.D.  [address], [address]  [place]  [place] [address]  Fax: [phone]
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
## 2011-04-12 NOTE — Cardiovascular Report (Signed)
NAMEHENRICK, MCGUE             ACCOUNT NO.:  [id number]   MEDICAL RECORD NO.:  [id number]          PATIENT TYPE:  INP   LOCATION:  3701                         FACILITY:  [facility]   PHYSICIAN:  Colleen Can. Deborah Chalk, M.D.DATE OF BIRTH:  [birth year]   DATE OF PROCEDURE:  DATE OF DISCHARGE:                              CARDIAC CATHETERIZATION   Audio too short to transcribe (less than 5 seconds)       SNT/MEDQ  D:  12/03/2004  T:  12/03/2004  Job:  [id number]

## 2011-04-12 NOTE — Discharge Summary (Signed)
[facility]  Patient:    [name], [name]                    MRN: [id number] Adm. Date:  [id number] Disc. Date: [id number] Attending:  Meredith Leeds                           Discharge Summary  HISTORY OF PRESENT ILLNESS:  The patient is a 55 year old white male with a history of several previous episodes of acute diverticulitis.  He was admitted to the hospital on 02/18/01, with a CT scan consistent with acute diverticulitis of the sigmoid colon and a clinical examination also consistent with acute diverticulitis.  See the history and physical for details.  PHYSICAL EXAMINATION:  Remarkable for left lower quadrant tenderness.  HOSPITAL COURSE:  The patient was treated with intravenous broad spectrum antibiotics and with bowel rest.  He rapidly improved.  When diet was started, he tolerated it well without any increase in his pain.  He felt well, was afebrile, eating a general diet, and wanted to go home on 02/21/01.  He is discharged on oral prescriptions of ciprofloxacin and Flagyl, and is to be seen by Dr. Jamey Ripa in the office in about two weeks, and to call if further problems arise.  DIAGNOSIS:  Acute diverticulitis of the sigmoid colon, improved.  OPERATIONS:  None. DD:  02/27/01 TD:  02/27/01 Job: [id number]

## 2011-04-12 NOTE — Cardiovascular Report (Signed)
[name], [name]             ACCOUNT NO.:  [id number]   MEDICAL RECORD NO.:  [id number]          PATIENT TYPE:  INP   LOCATION:  3701                         FACILITY:  [facility]   PHYSICIAN:  Colleen Can. Deborah Chalk, M.D.DATE OF BIRTH:  1956-07-15   DATE OF PROCEDURE:  DATE OF DISCHARGE:                              CARDIAC CATHETERIZATION   DATE OF PROCEDURE:  December 03, 2004.   PROCEDURE:  Left heart catheterization with selective coronary angiography  and left ventricular angiography.   TYPE AND SITE OF ENTRY:  Percutaneous right femoral artery.   CATHETER:  6-French 4 curve Judkins right and left coronary catheters, 6-  French pigtail, ventriculographic catheter.   CONTRAST:  Pure Omnipaque.   COMMENTS:  The patient tolerated the procedure well.   HEMODYNAMIC DATA:  The aortic pressure was 111/67, LV was 112/2-12.  There  was no aortic valve gradient on pullback.   ANGIOGRAPHIC DATA:  1.  Left main coronary artery:  The left main coronary artery had catheter      damping to a significant degree upon entering the ostium of the left      main coronary artery.  The diagnostic catheter appeared to be coaxial.      There was streaming of contrast through the left main coronary artery.      Distally, there appeared to be a segment near the bifurcation of the LAD      and left circumflex that had an inconsistent increased density.      However, there was no persistent staining of contrast.  In the body of      the left main coronary artery, there was a somewhat discrete 30%      narrowing.  2.  Left circumflex:  The left circumflex is a large, bifurcating, obtuse      marginal.  It is essentially normal.  3.  Left anterior descending:  The left anterior descending has a 50%      proximal narrowing.  There are irregularities in the vessels otherwise.  4.  Right coronary artery:  The right coronary artery is a dominant vessel.      There is a somewhat diffuse, 30% narrowing  scattered throughout the      right coronary artery.  There is no focal or critical stenosis.   The left ventricular angiogram was performed in the RAO position.  Overall  cardiac size and silhouette are normal with a global ejection fraction of 55-  60%.  Regional wall motion is normal.   OVERALL IMPRESSION:  1.  Ostial left main stenosis.  2.  Moderate coronary atherosclerosis otherwise.  3.  Probable streaming effect of contrast in the left main coronary artery,      and after multiple review it was not felt      to be intimal dissection at this point in time.  4.  Normal left ventricular function.   DISCUSSION:  In light of these findings, we will refer [name] for  coronary artery bypass grafting electively.       SNT/MEDQ  D:  12/03/2004  T:  12/03/2004  Job:  [id number] 

## 2011-04-12 NOTE — Op Note (Signed)
NAMEWEBBER, [name]             ACCOUNT NO.:  [id number]   MEDICAL RECORD NO.:  [id number]          PATIENT TYPE:  INP   LOCATION:  2035                         FACILITY:  [facility]   PHYSICIAN:  Judie Petit, M.D. DATE OF BIRTH:  April 29, 1956   DATE OF PROCEDURE:  12/05/2004  DATE OF DISCHARGE:                                 OPERATIVE REPORT   PROCEDURE PERFORMED:  Transesophageal echocardiogram.   ANESTHESIOLOGIST:  Judie Petit, M.D.   INDICATIONS FOR PROCEDURE:  Mr. Vanpatten is a 55 year old male who presents  today for coronary artery bypass grafting by Dr. Barry Dienes.  The transesophageal  echocardiogram will be utilized to assess function pre- and post bypass.   DESCRIPTION OF PROCEDURE:  After induction of anesthesia and intubation the  transesophageal probe was heavily lubricated in the sleeve and placed down  the oropharynx.  There was no difficulty.   PREBYPASS EXAMINATION:  Left Ventricle:  There were no segmental defects.  Contractility was good.   Aortic Valve:  The aortic valve appears trileaflet in nature.  There was no  significant aortic regurgitation.   Mitral Valve:  There was normal mitral valve leaflet excursion.  There was  no significant mitral regurgitant flow on Doppler examination.   Left Atrium:  The left atrium appeared within normal limits.  The intra-  atrial septum was intact.   Right Ventricle:  The right ventricle appeared within normal limits.   Overall this appeared to be a normal exam.   The patient was placed on cardiopulmonary bypass and underwent coronary  artery bypass grafting.  The patient was subsequently removed from bypass on  the initial attempt.   POST BYPASS EXAMINATION:  Post bypass examination - limited examination.  Left Ventricle:  The left ventricle appeared to be within normal limits.  There was no significant change from prebypass examination.   The patient was subsequently taken to the surgical intensive care unit  in  stable condition.     CE/MEDQ  D:  12/06/2004  T:  12/06/2004  Job:  [id number]

## 2012-10-22 ENCOUNTER — Emergency Department: Payer: Self-pay | Admitting: Emergency Medicine

## 2014-05-24 DIAGNOSIS — F3289 Other specified depressive episodes: Secondary | ICD-10-CM | POA: Diagnosis not present

## 2014-05-24 DIAGNOSIS — E7800 Pure hypercholesterolemia, unspecified: Secondary | ICD-10-CM | POA: Diagnosis not present

## 2014-05-24 DIAGNOSIS — I10 Essential (primary) hypertension: Secondary | ICD-10-CM | POA: Diagnosis not present

## 2014-05-24 DIAGNOSIS — Z0000 Encounter for general adult medical examination without abnormal findings: Secondary | ICD-10-CM | POA: Diagnosis not present

## 2014-05-24 DIAGNOSIS — I2510 Atherosclerotic heart disease of native coronary artery without angina pectoris: Secondary | ICD-10-CM | POA: Diagnosis not present

## 2014-05-24 DIAGNOSIS — K219 Gastro-esophageal reflux disease without esophagitis: Secondary | ICD-10-CM | POA: Diagnosis not present

## 2014-05-24 DIAGNOSIS — Z125 Encounter for screening for malignant neoplasm of prostate: Secondary | ICD-10-CM | POA: Diagnosis not present

## 2014-05-24 DIAGNOSIS — R195 Other fecal abnormalities: Secondary | ICD-10-CM | POA: Diagnosis not present

## 2014-05-24 DIAGNOSIS — E119 Type 2 diabetes mellitus without complications: Secondary | ICD-10-CM | POA: Diagnosis not present

## 2014-05-24 DIAGNOSIS — F17200 Nicotine dependence, unspecified, uncomplicated: Secondary | ICD-10-CM | POA: Diagnosis not present

## 2014-05-24 DIAGNOSIS — F329 Major depressive disorder, single episode, unspecified: Secondary | ICD-10-CM | POA: Diagnosis not present

## 2014-06-28 DIAGNOSIS — I25810 Atherosclerosis of coronary artery bypass graft(s) without angina pectoris: Secondary | ICD-10-CM | POA: Diagnosis not present

## 2014-06-28 DIAGNOSIS — I10 Essential (primary) hypertension: Secondary | ICD-10-CM | POA: Diagnosis not present

## 2014-06-28 DIAGNOSIS — E785 Hyperlipidemia, unspecified: Secondary | ICD-10-CM | POA: Diagnosis not present

## 2014-06-28 DIAGNOSIS — F17200 Nicotine dependence, unspecified, uncomplicated: Secondary | ICD-10-CM | POA: Diagnosis not present

## 2014-10-25 DIAGNOSIS — D485 Neoplasm of uncertain behavior of skin: Secondary | ICD-10-CM | POA: Diagnosis not present

## 2014-10-25 DIAGNOSIS — L821 Other seborrheic keratosis: Secondary | ICD-10-CM | POA: Diagnosis not present

## 2014-10-25 DIAGNOSIS — D225 Melanocytic nevi of trunk: Secondary | ICD-10-CM | POA: Diagnosis not present

## 2014-10-25 DIAGNOSIS — L920 Granuloma annulare: Secondary | ICD-10-CM | POA: Diagnosis not present

## 2014-10-25 DIAGNOSIS — L309 Dermatitis, unspecified: Secondary | ICD-10-CM | POA: Diagnosis not present

## 2014-12-06 DIAGNOSIS — I2510 Atherosclerotic heart disease of native coronary artery without angina pectoris: Secondary | ICD-10-CM | POA: Diagnosis not present

## 2014-12-06 DIAGNOSIS — Z23 Encounter for immunization: Secondary | ICD-10-CM | POA: Diagnosis not present

## 2014-12-06 DIAGNOSIS — I10 Essential (primary) hypertension: Secondary | ICD-10-CM | POA: Diagnosis not present

## 2014-12-06 DIAGNOSIS — E119 Type 2 diabetes mellitus without complications: Secondary | ICD-10-CM | POA: Diagnosis not present

## 2014-12-06 DIAGNOSIS — Z6827 Body mass index (BMI) 27.0-27.9, adult: Secondary | ICD-10-CM | POA: Diagnosis not present

## 2014-12-06 DIAGNOSIS — E780 Pure hypercholesterolemia: Secondary | ICD-10-CM | POA: Diagnosis not present

## 2015-01-10 DIAGNOSIS — R0602 Shortness of breath: Secondary | ICD-10-CM | POA: Diagnosis not present

## 2015-01-10 DIAGNOSIS — I10 Essential (primary) hypertension: Secondary | ICD-10-CM | POA: Diagnosis not present

## 2015-01-10 DIAGNOSIS — Z720 Tobacco use: Secondary | ICD-10-CM | POA: Diagnosis not present

## 2015-01-10 DIAGNOSIS — I25810 Atherosclerosis of coronary artery bypass graft(s) without angina pectoris: Secondary | ICD-10-CM | POA: Diagnosis not present

## 2015-03-04 ENCOUNTER — Emergency Department (INDEPENDENT_AMBULATORY_CARE_PROVIDER_SITE_OTHER): Payer: Medicare Other

## 2015-03-04 ENCOUNTER — Encounter (HOSPITAL_COMMUNITY): Payer: Self-pay | Admitting: Emergency Medicine

## 2015-03-04 ENCOUNTER — Emergency Department (INDEPENDENT_AMBULATORY_CARE_PROVIDER_SITE_OTHER)
Admission: EM | Admit: 2015-03-04 | Discharge: 2015-03-04 | Disposition: A | Discharge status: Home / Self Care | Payer: Medicare Other | Source: Home / Self Care | Attending: Family Medicine | Admitting: Family Medicine

## 2015-03-04 DIAGNOSIS — S93601A Unspecified sprain of right foot, initial encounter: Secondary | ICD-10-CM

## 2015-03-04 DIAGNOSIS — S93401A Sprain of unspecified ligament of right ankle, initial encounter: Secondary | ICD-10-CM

## 2015-03-04 MED ORDER — TRAMADOL HCL 50 MG PO TABS: 50.0000 mg | ORAL_TABLET | Freq: Four times a day (QID) | ORAL | Status: DC | PRN

## 2015-03-04 NOTE — ED Provider Notes (Signed)
CSN: [id number]     Arrival date & time 03/04/15  1622 History   First MD Initiated Contact with Patient 03/04/15 1658     Chief Complaint  Patient presents with  . Foot Injury   (Consider location/radiation/quality/duration/timing/severity/associated sxs/prior Treatment) HPI Comments: 59 year old male states that he was outside and stepped in of hole with his right foot this morning around 9:00. He did not experience any pain initially however after he continued to walk for a while and later take off his shoe he experienced substantial pain to the medial aspect of the ankle and the foot. States it is too painful to bear weight at this time.   Past Medical History  Diagnosis Date  . Hypertension    History reviewed. No pertinent past surgical history. History reviewed. No pertinent family history. History  Substance Use Topics  . Smoking status: Current Every Day Smoker -- 1.00 packs/day    Types: Cigarettes  . Smokeless tobacco: Not on file  . Alcohol Use: No    Review of Systems  Constitutional: Positive for activity change.  Respiratory: Negative.   Genitourinary: Negative.   Musculoskeletal:       As per HPI  Skin: Negative.   Neurological: Negative for weakness and numbness.    Allergies  Penicillins  Home Medications   Prior to Admission medications   Medication Sig Start Date End Date Taking? Authorizing Provider  aspirin 325 MG EC tablet Take 325 mg by mouth daily.   Yes Historical Provider, MD  FLUoxetine (PROZAC) 40 MG capsule Take 40 mg by mouth daily.   Yes Historical Provider, MD  metoprolol (LOPRESSOR) 50 MG tablet Take 50 mg by mouth 2 (two) times daily.   Yes Historical Provider, MD  pantoprazole (PROTONIX) 40 MG tablet Take 40 mg by mouth daily.   Yes Historical Provider, MD  PRAVASTATIN SODIUM PO Take by mouth.   Yes Historical Provider, MD  traMADol (ULTRAM) 50 MG tablet Take 1 tablet (50 mg total) by mouth every 6 (six) hours as needed. 03/04/15   Shane Napoleon, NP   BP 118/70 mmHg  Pulse 69  Temp(Src) 98.3 F (36.8 C) (Oral)  Resp 16  SpO2 97% Physical Exam  Constitutional: He is oriented to person, place, and time. He appears well-developed and well-nourished. No distress.  Neck: Normal range of motion. Neck supple.  Pulmonary/Chest: Effort normal. No respiratory distress.  Musculoskeletal:  Right foot exam with minimal swelling to the medial aspect of the right foot. There is tenderness about the ankle and the dorsal lateral aspect of the foot. He is able to dorsiflex and plantar flex but with pain and there is some limitation in the range of motion. No deformity, discoloration or pallor. Pedal pulses 2+. Capillary refill is brisk. Neurovascular motor sensory is intact.  Neurological: He is alert and oriented to person, place, and time.  Skin: Skin is warm and dry.  Nursing note and vitals reviewed.   ED Course  Procedures (including critical care time) Labs Review Labs Reviewed - No data to display  Imaging Review Dg Ankle Complete Right  03/04/2015   CLINICAL DATA:  Acute right ankle pain after twisting injury. Initial encounter.  EXAM: RIGHT ANKLE - COMPLETE 3+ VIEW  COMPARISON:  None.  FINDINGS: There is no evidence of fracture, dislocation, or joint effusion. There is no evidence of arthropathy or other focal bone abnormality. Soft tissues are unremarkable.  IMPRESSION: Normal right ankle.   Electronically Signed   By: Jeneen Rinks  Murlean Caller, M.D.   On: 03/04/2015 17:42   Dg Foot Complete Right  03/04/2015   CLINICAL DATA:  Acute right foot pain after twisting injury today. Initial encounter.  EXAM: RIGHT FOOT COMPLETE - 3+ VIEW  COMPARISON:  None.  FINDINGS: There is no evidence of fracture or dislocation. Mild degenerative joint disease is seen involving the first metatarsophalangeal joint. Soft tissues are unremarkable.  IMPRESSION: No acute abnormality seen in the right foot.   Electronically Signed   By: Marijo Conception, M.D.   On:  03/04/2015 17:41     MDM   1. Ankle sprain, right, initial encounter   2. Foot sprain, right, initial encounter    RICE Limit wt bearing, use crutches for a couple of days and then gradually start wt bearing. Pt declined tramadol   Shane Napoleon, NP 03/04/15 1754  Shane Napoleon, NP 03/04/15 [phone]

## 2015-03-04 NOTE — ED Notes (Signed)
Reports stepping in a hole around 11 this a.m.   Having pain and swelling of the right foot.  Pain with flexion/extension.   Unable to bear weight.   No relief with ice and ibuprofen.

## 2015-03-04 NOTE — Discharge Instructions (Signed)
Ankle Sprain °An ankle sprain is an injury to the strong, fibrous tissues (ligaments) that hold the bones of your ankle joint together.  °CAUSES °An ankle sprain is usually caused by a fall or by twisting your ankle. Ankle sprains most commonly occur when you step on the outer edge of your foot, and your ankle turns inward. People who participate in sports are more prone to these types of injuries.  °SYMPTOMS  °· Pain in your ankle. The pain may be present at rest or only when you are trying to stand or walk. °· Swelling. °· Bruising. Bruising may develop immediately or within 1 to 2 days after your injury. °· Difficulty standing or walking, particularly when turning corners or changing directions. °DIAGNOSIS  °Your caregiver will ask you details about your injury and perform a physical exam of your ankle to determine if you have an ankle sprain. During the physical exam, your caregiver will press on and apply pressure to specific areas of your foot and ankle. Your caregiver will try to move your ankle in certain ways. An X-ray exam may be done to be sure a bone was not broken or a ligament did not separate from one of the bones in your ankle (avulsion fracture).  °TREATMENT  °Certain types of braces can help stabilize your ankle. Your caregiver can make a recommendation for this. Your caregiver may recommend the use of medicine for pain. If your sprain is severe, your caregiver may refer you to a surgeon who helps to restore function to parts of your skeletal system (orthopedist) or a physical therapist. °HOME CARE INSTRUCTIONS  °· Apply ice to your injury for 1-2 days or as directed by your caregiver. Applying ice helps to reduce inflammation and pain. °· Put ice in a plastic bag. °· Place a towel between your skin and the bag. °· Leave the ice on for 15-20 minutes at a time, every 2 hours while you are awake. °· Only take over-the-counter or prescription medicines for pain, discomfort, or fever as directed by  your caregiver. °· Elevate your injured ankle above the level of your heart as much as possible for 2-3 days. °· If your caregiver recommends crutches, use them as instructed. Gradually put weight on the affected ankle. Continue to use crutches or a cane until you can walk without feeling pain in your ankle. °· If you have a plaster splint, wear the splint as directed by your caregiver. Do not rest it on anything harder than a pillow for the first 24 hours. Do not put weight on it. Do not get it wet. You may take it off to take a shower or bath. °· You may have been given an elastic bandage to wear around your ankle to provide support. If the elastic bandage is too tight (you have numbness or tingling in your foot or your foot becomes cold and blue), adjust the bandage to make it comfortable. °· If you have an air splint, you may blow more air into it or let air out to make it more comfortable. You may take your splint off at night and before taking a shower or bath. Wiggle your toes in the splint several times per day to decrease swelling. °SEEK MEDICAL CARE IF:  °· You have rapidly increasing bruising or swelling. °· Your toes feel extremely cold or you lose feeling in your foot. °· Your pain is not relieved with medicine. °SEEK IMMEDIATE MEDICAL CARE IF: °· Your toes are numb or blue. °·   You have severe pain that is increasing. MAKE SURE YOU:   Understand these instructions.  Will watch your condition.  Will get help right away if you are not doing well or get worse. Document Released: 11/11/2005 Document Revised: 08/05/2012 Document Reviewed: 11/23/2011 [facility] Patient Information 2015 [place], [place]. This information is not intended to replace advice given to you by your health care provider. Make sure you discuss any questions you have with your health care provider.  Foot Sprain The muscles and cord like structures which attach muscle to bone (tendons) that surround the feet are made up of  units. A foot sprain can occur at the weakest spot in any of these units. This condition is most often caused by injury to or overuse of the foot, as from playing contact sports, or aggravating a previous injury, or from poor conditioning, or obesity. SYMPTOMS  Pain with movement of the foot.  Tenderness and swelling at the injury site.  Loss of strength is present in moderate or severe sprains. THE THREE GRADES OR SEVERITY OF FOOT SPRAIN ARE:  Mild (Grade I): Slightly pulled muscle without tearing of muscle or tendon fibers or loss of strength.  Moderate (Grade II): Tearing of fibers in a muscle, tendon, or at the attachment to bone, with small decrease in strength.  Severe (Grade III): Rupture of the muscle-tendon-bone attachment, with separation of fibers. Severe sprain requires surgical repair. Often repeating (chronic) sprains are caused by overuse. Sudden (acute) sprains are caused by direct injury or over-use. DIAGNOSIS  Diagnosis of this condition is usually by your own observation. If problems continue, a caregiver may be required for further evaluation and treatment. X-rays may be required to make sure there are not breaks in the bones (fractures) present. Continued problems may require physical therapy for treatment. PREVENTION  Use strength and conditioning exercises appropriate for your sport.  Warm up properly prior to working out.  Use athletic shoes that are made for the sport you are participating in.  Allow adequate time for healing. Early return to activities makes repeat injury more likely, and can lead to an unstable arthritic foot that can result in prolonged disability. Mild sprains generally heal in 3 to 10 days, with moderate and severe sprains taking 2 to 10 weeks. Your caregiver can help you determine the proper time required for healing. HOME CARE INSTRUCTIONS   Apply ice to the injury for 15-20 minutes, 03-04 times per day. Put the ice in a plastic bag and  place a towel between the bag of ice and your skin.  An elastic wrap (like an Ace bandage) may be used to keep swelling down.  Keep foot above the level of the heart, or at least raised on a footstool, when swelling and pain are present.  Try to avoid use other than gentle range of motion while the foot is painful. Do not resume use until instructed by your caregiver. Then begin use gradually, not increasing use to the point of pain. If pain does develop, decrease use and continue the above measures, gradually increasing activities that do not cause discomfort, until you gradually achieve normal use.  Use crutches if and as instructed, and for the length of time instructed.  Keep injured foot and ankle wrapped between treatments.  Massage foot and ankle for comfort and to keep swelling down. Massage from the toes up towards the knee.  Only take over-the-counter or prescription medicines for pain, discomfort, or fever as directed by your caregiver. SEEK IMMEDIATE  MEDICAL CARE IF:   Your pain and swelling increase, or pain is not controlled with medications.  You have loss of feeling in your foot or your foot turns cold or blue.  You develop new, unexplained symptoms, or an increase of the symptoms that brought you to your caregiver. MAKE SURE YOU:   Understand these instructions.  Will watch your condition.  Will get help right away if you are not doing well or get worse. Document Released: 05/03/2002 Document Revised: 02/03/2012 Document Reviewed: 06/30/2008 [facility] Patient Information 2015 [place], [place]. This information is not intended to replace advice given to you by your health care provider. Make sure you discuss any questions you have with your health care provider.

## 2015-06-13 DIAGNOSIS — R195 Other fecal abnormalities: Secondary | ICD-10-CM | POA: Diagnosis not present

## 2015-06-13 DIAGNOSIS — Z125 Encounter for screening for malignant neoplasm of prostate: Secondary | ICD-10-CM | POA: Diagnosis not present

## 2015-06-13 DIAGNOSIS — Z0000 Encounter for general adult medical examination without abnormal findings: Secondary | ICD-10-CM | POA: Diagnosis not present

## 2015-06-13 DIAGNOSIS — F17200 Nicotine dependence, unspecified, uncomplicated: Secondary | ICD-10-CM | POA: Diagnosis not present

## 2015-06-13 DIAGNOSIS — Z1211 Encounter for screening for malignant neoplasm of colon: Secondary | ICD-10-CM | POA: Diagnosis not present

## 2015-06-13 DIAGNOSIS — I10 Essential (primary) hypertension: Secondary | ICD-10-CM | POA: Diagnosis not present

## 2015-06-13 DIAGNOSIS — Z6827 Body mass index (BMI) 27.0-27.9, adult: Secondary | ICD-10-CM | POA: Diagnosis not present

## 2015-06-13 DIAGNOSIS — F329 Major depressive disorder, single episode, unspecified: Secondary | ICD-10-CM | POA: Diagnosis not present

## 2015-06-13 DIAGNOSIS — I2510 Atherosclerotic heart disease of native coronary artery without angina pectoris: Secondary | ICD-10-CM | POA: Diagnosis not present

## 2015-06-13 DIAGNOSIS — E780 Pure hypercholesterolemia: Secondary | ICD-10-CM | POA: Diagnosis not present

## 2015-06-13 DIAGNOSIS — E119 Type 2 diabetes mellitus without complications: Secondary | ICD-10-CM | POA: Diagnosis not present

## 2015-07-11 DIAGNOSIS — I25810 Atherosclerosis of coronary artery bypass graft(s) without angina pectoris: Secondary | ICD-10-CM | POA: Diagnosis not present

## 2015-07-11 DIAGNOSIS — E782 Mixed hyperlipidemia: Secondary | ICD-10-CM | POA: Diagnosis not present

## 2015-07-11 DIAGNOSIS — I70219 Atherosclerosis of native arteries of extremities with intermittent claudication, unspecified extremity: Secondary | ICD-10-CM | POA: Insufficient documentation

## 2015-07-11 DIAGNOSIS — I10 Essential (primary) hypertension: Secondary | ICD-10-CM | POA: Diagnosis not present

## 2015-07-18 DIAGNOSIS — D72829 Elevated white blood cell count, unspecified: Secondary | ICD-10-CM | POA: Diagnosis not present

## 2015-08-22 DIAGNOSIS — D72829 Elevated white blood cell count, unspecified: Secondary | ICD-10-CM | POA: Diagnosis not present

## 2015-09-12 DIAGNOSIS — I10 Essential (primary) hypertension: Secondary | ICD-10-CM | POA: Diagnosis not present

## 2015-09-12 DIAGNOSIS — I25810 Atherosclerosis of coronary artery bypass graft(s) without angina pectoris: Secondary | ICD-10-CM | POA: Diagnosis not present

## 2015-09-12 DIAGNOSIS — E782 Mixed hyperlipidemia: Secondary | ICD-10-CM | POA: Diagnosis not present

## 2015-10-02 DIAGNOSIS — J019 Acute sinusitis, unspecified: Secondary | ICD-10-CM | POA: Diagnosis not present

## 2015-10-02 DIAGNOSIS — J209 Acute bronchitis, unspecified: Secondary | ICD-10-CM | POA: Diagnosis not present

## 2015-12-14 DIAGNOSIS — Z6827 Body mass index (BMI) 27.0-27.9, adult: Secondary | ICD-10-CM | POA: Diagnosis not present

## 2015-12-14 DIAGNOSIS — Z23 Encounter for immunization: Secondary | ICD-10-CM | POA: Diagnosis not present

## 2015-12-14 DIAGNOSIS — F329 Major depressive disorder, single episode, unspecified: Secondary | ICD-10-CM | POA: Diagnosis not present

## 2015-12-14 DIAGNOSIS — I10 Essential (primary) hypertension: Secondary | ICD-10-CM | POA: Diagnosis not present

## 2015-12-14 DIAGNOSIS — E119 Type 2 diabetes mellitus without complications: Secondary | ICD-10-CM | POA: Diagnosis not present

## 2015-12-14 DIAGNOSIS — Z1211 Encounter for screening for malignant neoplasm of colon: Secondary | ICD-10-CM | POA: Diagnosis not present

## 2015-12-14 DIAGNOSIS — E7800 Pure hypercholesterolemia, unspecified: Secondary | ICD-10-CM | POA: Diagnosis not present

## 2015-12-14 DIAGNOSIS — F17200 Nicotine dependence, unspecified, uncomplicated: Secondary | ICD-10-CM | POA: Diagnosis not present

## 2015-12-14 DIAGNOSIS — I2510 Atherosclerotic heart disease of native coronary artery without angina pectoris: Secondary | ICD-10-CM | POA: Diagnosis not present

## 2016-01-16 DIAGNOSIS — R0602 Shortness of breath: Secondary | ICD-10-CM | POA: Diagnosis not present

## 2016-01-16 DIAGNOSIS — I739 Peripheral vascular disease, unspecified: Secondary | ICD-10-CM | POA: Diagnosis not present

## 2016-01-16 DIAGNOSIS — I25810 Atherosclerosis of coronary artery bypass graft(s) without angina pectoris: Secondary | ICD-10-CM | POA: Diagnosis not present

## 2016-01-16 DIAGNOSIS — Z720 Tobacco use: Secondary | ICD-10-CM | POA: Diagnosis not present

## 2016-01-16 DIAGNOSIS — E782 Mixed hyperlipidemia: Secondary | ICD-10-CM | POA: Diagnosis not present

## 2016-01-16 DIAGNOSIS — I10 Essential (primary) hypertension: Secondary | ICD-10-CM | POA: Diagnosis not present

## 2016-02-13 DIAGNOSIS — I10 Essential (primary) hypertension: Secondary | ICD-10-CM | POA: Diagnosis not present

## 2016-02-13 DIAGNOSIS — E782 Mixed hyperlipidemia: Secondary | ICD-10-CM | POA: Diagnosis not present

## 2016-02-13 DIAGNOSIS — R0602 Shortness of breath: Secondary | ICD-10-CM | POA: Diagnosis not present

## 2016-02-13 DIAGNOSIS — Z720 Tobacco use: Secondary | ICD-10-CM | POA: Diagnosis not present

## 2016-02-13 DIAGNOSIS — I25810 Atherosclerosis of coronary artery bypass graft(s) without angina pectoris: Secondary | ICD-10-CM | POA: Diagnosis not present

## 2016-02-13 DIAGNOSIS — I739 Peripheral vascular disease, unspecified: Secondary | ICD-10-CM | POA: Diagnosis not present

## 2016-02-27 DIAGNOSIS — M7541 Impingement syndrome of right shoulder: Secondary | ICD-10-CM | POA: Diagnosis not present

## 2016-02-29 ENCOUNTER — Other Ambulatory Visit: Payer: Self-pay | Admitting: Orthopedic Surgery

## 2016-02-29 DIAGNOSIS — M7541 Impingement syndrome of right shoulder: Secondary | ICD-10-CM

## 2016-03-22 ENCOUNTER — Ambulatory Visit
Admission: RE | Admit: 2016-03-22 | Discharge: 2016-03-22 | Disposition: A | Discharge status: Home / Self Care | Payer: Medicare Other | Source: Ambulatory Visit | Attending: Orthopedic Surgery | Admitting: Orthopedic Surgery

## 2016-03-22 DIAGNOSIS — M7541 Impingement syndrome of right shoulder: Secondary | ICD-10-CM

## 2016-03-22 DIAGNOSIS — M75111 Incomplete rotator cuff tear or rupture of right shoulder, not specified as traumatic: Secondary | ICD-10-CM | POA: Diagnosis not present

## 2016-03-26 ENCOUNTER — Other Ambulatory Visit: Payer: Self-pay | Admitting: Specialist

## 2016-03-26 DIAGNOSIS — M75121 Complete rotator cuff tear or rupture of right shoulder, not specified as traumatic: Secondary | ICD-10-CM | POA: Diagnosis not present

## 2016-03-27 DIAGNOSIS — F17200 Nicotine dependence, unspecified, uncomplicated: Secondary | ICD-10-CM | POA: Diagnosis not present

## 2016-03-27 DIAGNOSIS — M75101 Unspecified rotator cuff tear or rupture of right shoulder, not specified as traumatic: Secondary | ICD-10-CM | POA: Diagnosis not present

## 2016-03-27 DIAGNOSIS — R6889 Other general symptoms and signs: Secondary | ICD-10-CM | POA: Diagnosis not present

## 2016-03-27 DIAGNOSIS — I2510 Atherosclerotic heart disease of native coronary artery without angina pectoris: Secondary | ICD-10-CM | POA: Diagnosis not present

## 2016-03-27 DIAGNOSIS — I10 Essential (primary) hypertension: Secondary | ICD-10-CM | POA: Diagnosis not present

## 2016-03-27 DIAGNOSIS — Z1389 Encounter for screening for other disorder: Secondary | ICD-10-CM | POA: Diagnosis not present

## 2016-03-27 DIAGNOSIS — Z6827 Body mass index (BMI) 27.0-27.9, adult: Secondary | ICD-10-CM | POA: Diagnosis not present

## 2016-03-27 DIAGNOSIS — E119 Type 2 diabetes mellitus without complications: Secondary | ICD-10-CM | POA: Diagnosis not present

## 2016-03-27 DIAGNOSIS — Z01818 Encounter for other preprocedural examination: Secondary | ICD-10-CM | POA: Diagnosis not present

## 2016-04-02 ENCOUNTER — Other Ambulatory Visit: Payer: Medicare Other

## 2016-04-02 DIAGNOSIS — R0602 Shortness of breath: Secondary | ICD-10-CM | POA: Diagnosis not present

## 2016-04-02 DIAGNOSIS — I10 Essential (primary) hypertension: Secondary | ICD-10-CM | POA: Diagnosis not present

## 2016-04-02 DIAGNOSIS — I25708 Atherosclerosis of coronary artery bypass graft(s), unspecified, with other forms of angina pectoris: Secondary | ICD-10-CM | POA: Diagnosis not present

## 2016-04-02 DIAGNOSIS — E782 Mixed hyperlipidemia: Secondary | ICD-10-CM | POA: Diagnosis not present

## 2016-04-03 ENCOUNTER — Encounter
Admission: RE | Admit: 2016-04-03 | Discharge: 2016-04-03 | Disposition: A | Discharge status: Home / Self Care | Payer: Medicare Other | Source: Ambulatory Visit | Attending: Specialist | Admitting: Specialist

## 2016-04-03 HISTORY — DX: Peripheral vascular disease, unspecified: I73.9

## 2016-04-03 HISTORY — DX: Gastric ulcer, unspecified as acute or chronic, without hemorrhage or perforation: K25.9

## 2016-04-03 HISTORY — DX: Insomnia, unspecified: G47.00

## 2016-04-03 HISTORY — DX: Atherosclerotic heart disease of native coronary artery without angina pectoris: I25.10

## 2016-04-03 HISTORY — DX: Personal history of Methicillin resistant Staphylococcus aureus infection: Z86.14

## 2016-04-03 HISTORY — DX: Type 2 diabetes mellitus without complications: E11.9

## 2016-04-03 HISTORY — DX: Gastro-esophageal reflux disease without esophagitis: K21.9

## 2016-04-03 HISTORY — DX: Major depressive disorder, single episode, unspecified: F32.9

## 2016-04-03 HISTORY — DX: Hyperlipidemia, unspecified: E78.5

## 2016-04-03 HISTORY — DX: Depression, unspecified: F32.A

## 2016-04-03 LAB — SURGICAL PCR SCREEN
MRSA, PCR: UNDETERMINED — AB
Staphylococcus aureus: UNDETERMINED — AB

## 2016-04-03 NOTE — Patient Instructions (Signed)
Your procedure is scheduled on: Monday 04/08/16 Report to Day Surgery. 2ND FLOOR MEDICAL MALL ENTRANCE To find out your arrival time please call [phone] between 1PM - 3PM on Friday 04/05/16  Remember: Instructions that are not followed completely may result in serious medical risk, up to and including death, or upon the discretion of your surgeon and anesthesiologist your surgery may need to be rescheduled.    __X__ 1. Do not eat food or drink liquids after midnight. No gum chewing or hard candies.     __X__ 2. No Alcohol for 24 hours before or after surgery.   ____ 3. Bring all medications with you on the day of surgery if instructed.    __X__ 4. Notify your doctor if there is any change in your medical condition     (cold, fever, infections).     Do not wear jewelry, make-up, hairpins, clips or nail polish.  Do not wear lotions, powders, or perfumes.   Do not shave 48 hours prior to surgery. Men may shave face and neck.  Do not bring valuables to the hospital.    [facility] is not responsible for any belongings or valuables.               Contacts, dentures or bridgework may not be worn into surgery.  Leave your suitcase in the car. After surgery it may be brought to your room.  For patients admitted to the hospital, discharge time is determined by your                treatment team.   Patients discharged the day of surgery will not be allowed to drive home.   Please read over the following fact sheets that you were given:   MRSA Information and Surgical Site Infection Prevention   __X_ Take these medicines the morning of surgery with A SIP OF WATER:    1. CITALOPRAM  2. LANSOPRAZOLE  3. LISINOPRIL  4.  5.  6.  ____ Fleet Enema (as directed)   __X_ Use CHG Soap as directed  ____ Use inhalers on the day of surgery  ____ Stop metformin 2 days prior to surgery    ____ Take 1/2 of usual insulin dose the night before surgery and none on the morning of surgery.    __X_ Stop Coumadin/Plavix/aspirin on 5 DAYS PRIOR TO PROCEDURE AS INSTRUCTED BY CARDIOLOGIST  ____ Stop Anti-inflammatories on    __X_ Stop supplements until after surgery.  FISH OIL/OMEGA 3  ____ Bring C-Pap to the hospital.

## 2016-04-03 NOTE — Pre-Procedure Instructions (Signed)
ANESTHESIA - CARDIOLOGY CLEARANCE ON CHART

## 2016-04-08 ENCOUNTER — Ambulatory Visit
Admission: RE | Admit: 2016-04-08 | Discharge: 2016-04-08 | Disposition: A | Discharge status: Home / Self Care | Payer: Medicare Other | Source: Ambulatory Visit | Attending: Specialist | Admitting: Specialist

## 2016-04-08 ENCOUNTER — Encounter: Payer: Self-pay | Admitting: *Deleted

## 2016-04-08 ENCOUNTER — Ambulatory Visit: Payer: Medicare Other | Admitting: Anesthesiology

## 2016-04-08 ENCOUNTER — Encounter
Admission: RE | Disposition: A | Discharge status: Home / Self Care | Payer: Self-pay | Source: Ambulatory Visit | Attending: Specialist

## 2016-04-08 DIAGNOSIS — Z8614 Personal history of Methicillin resistant Staphylococcus aureus infection: Secondary | ICD-10-CM | POA: Insufficient documentation

## 2016-04-08 DIAGNOSIS — K219 Gastro-esophageal reflux disease without esophagitis: Secondary | ICD-10-CM | POA: Diagnosis not present

## 2016-04-08 DIAGNOSIS — F17200 Nicotine dependence, unspecified, uncomplicated: Secondary | ICD-10-CM | POA: Insufficient documentation

## 2016-04-08 DIAGNOSIS — G4700 Insomnia, unspecified: Secondary | ICD-10-CM | POA: Insufficient documentation

## 2016-04-08 DIAGNOSIS — E785 Hyperlipidemia, unspecified: Secondary | ICD-10-CM | POA: Insufficient documentation

## 2016-04-08 DIAGNOSIS — G8918 Other acute postprocedural pain: Secondary | ICD-10-CM | POA: Diagnosis not present

## 2016-04-08 DIAGNOSIS — I10 Essential (primary) hypertension: Secondary | ICD-10-CM | POA: Diagnosis not present

## 2016-04-08 DIAGNOSIS — M7551 Bursitis of right shoulder: Secondary | ICD-10-CM | POA: Insufficient documentation

## 2016-04-08 DIAGNOSIS — M25511 Pain in right shoulder: Secondary | ICD-10-CM | POA: Diagnosis not present

## 2016-04-08 DIAGNOSIS — M19011 Primary osteoarthritis, right shoulder: Secondary | ICD-10-CM | POA: Diagnosis not present

## 2016-04-08 DIAGNOSIS — F329 Major depressive disorder, single episode, unspecified: Secondary | ICD-10-CM | POA: Insufficient documentation

## 2016-04-08 DIAGNOSIS — I2510 Atherosclerotic heart disease of native coronary artery without angina pectoris: Secondary | ICD-10-CM | POA: Diagnosis not present

## 2016-04-08 DIAGNOSIS — M75121 Complete rotator cuff tear or rupture of right shoulder, not specified as traumatic: Secondary | ICD-10-CM | POA: Diagnosis not present

## 2016-04-08 DIAGNOSIS — I739 Peripheral vascular disease, unspecified: Secondary | ICD-10-CM | POA: Diagnosis not present

## 2016-04-08 DIAGNOSIS — S43421A Sprain of right rotator cuff capsule, initial encounter: Secondary | ICD-10-CM | POA: Diagnosis not present

## 2016-04-08 DIAGNOSIS — Z5333 Arthroscopic surgical procedure converted to open procedure: Secondary | ICD-10-CM | POA: Diagnosis not present

## 2016-04-08 DIAGNOSIS — M7541 Impingement syndrome of right shoulder: Secondary | ICD-10-CM | POA: Diagnosis not present

## 2016-04-08 DIAGNOSIS — E118 Type 2 diabetes mellitus with unspecified complications: Secondary | ICD-10-CM | POA: Insufficient documentation

## 2016-04-08 DIAGNOSIS — Z951 Presence of aortocoronary bypass graft: Secondary | ICD-10-CM | POA: Insufficient documentation

## 2016-04-08 DIAGNOSIS — M7521 Bicipital tendinitis, right shoulder: Secondary | ICD-10-CM | POA: Diagnosis not present

## 2016-04-08 HISTORY — PX: SHOULDER ARTHROSCOPY WITH OPEN ROTATOR CUFF REPAIR: SHX6092

## 2016-04-08 LAB — SURGICAL PCR SCREEN
MRSA, PCR: NEGATIVE
Staphylococcus aureus: NEGATIVE

## 2016-04-08 LAB — GLUCOSE, CAPILLARY
GLUCOSE-CAPILLARY: 183 mg/dL — AB (ref 65–99)
Glucose-Capillary: 117 mg/dL — ABNORMAL HIGH (ref 65–99)

## 2016-04-08 SURGERY — ARTHROSCOPY, SHOULDER WITH REPAIR, ROTATOR CUFF, OPEN
Anesthesia: General | Site: Shoulder | Laterality: Right | Wound class: Clean

## 2016-04-08 MED ORDER — EPHEDRINE SULFATE 50 MG/ML IJ SOLN
INTRAMUSCULAR | Status: DC | PRN
  Administered 2016-04-08: 10 mg via INTRAVENOUS
  Administered 2016-04-08: 5 mg via INTRAVENOUS
  Administered 2016-04-08 (×2): 10 mg via INTRAVENOUS

## 2016-04-08 MED ORDER — FENTANYL CITRATE (PF) 100 MCG/2ML IJ SOLN
50.0000 ug | Freq: Once | INTRAMUSCULAR | Status: AC
Start: 2016-04-08 — End: 2016-04-08
  Administered 2016-04-08: 50 ug via INTRAVENOUS

## 2016-04-08 MED ORDER — PHENYLEPHRINE HCL 10 MG/ML IJ SOLN
INTRAMUSCULAR | Status: DC | PRN
  Administered 2016-04-08 (×2): 100 ug via INTRAVENOUS

## 2016-04-08 MED ORDER — GABAPENTIN 400 MG PO CAPS
ORAL_CAPSULE | ORAL | Status: AC
  Administered 2016-04-08: 400 mg via ORAL
  Filled 2016-04-08: qty 1

## 2016-04-08 MED ORDER — ACETAMINOPHEN 10 MG/ML IV SOLN
INTRAVENOUS | Status: AC
  Filled 2016-04-08: qty 100

## 2016-04-08 MED ORDER — ROCURONIUM BROMIDE 100 MG/10ML IV SOLN
INTRAVENOUS | Status: DC | PRN
  Administered 2016-04-08: 50 mg via INTRAVENOUS

## 2016-04-08 MED ORDER — CLINDAMYCIN PHOSPHATE 600 MG/50ML IV SOLN: 600.0000 mg | Freq: Once | INTRAVENOUS | Status: DC

## 2016-04-08 MED ORDER — ACETAMINOPHEN 10 MG/ML IV SOLN
INTRAVENOUS | Status: DC | PRN
  Administered 2016-04-08: 1000 mg via INTRAVENOUS

## 2016-04-08 MED ORDER — MORPHINE SULFATE (PF) 4 MG/ML IV SOLN
INTRAVENOUS | Status: DC | PRN
  Administered 2016-04-08: 4 mg via INTRAMUSCULAR

## 2016-04-08 MED ORDER — GABAPENTIN 400 MG PO CAPS
400.0000 mg | ORAL_CAPSULE | Freq: Three times a day (TID) | ORAL | Status: DC
Start: 2016-04-08 — End: 2018-01-16

## 2016-04-08 MED ORDER — BUPIVACAINE-EPINEPHRINE (PF) 0.25% -1:200000 IJ SOLN
INTRAMUSCULAR | Status: AC
  Filled 2016-04-08: qty 30

## 2016-04-08 MED ORDER — MORPHINE SULFATE (PF) 4 MG/ML IV SOLN
INTRAVENOUS | Status: AC
  Filled 2016-04-08: qty 1

## 2016-04-08 MED ORDER — MIDAZOLAM HCL 5 MG/5ML IJ SOLN
INTRAMUSCULAR | Status: DC
Start: 2016-04-08 — End: 2016-04-08
  Filled 2016-04-08: qty 5

## 2016-04-08 MED ORDER — OXYCODONE HCL 5 MG/5ML PO SOLN: 5.0000 mg | Freq: Once | ORAL | Status: DC | PRN

## 2016-04-08 MED ORDER — MELOXICAM 7.5 MG PO TABS
7.5000 mg | ORAL_TABLET | Freq: Once | ORAL | Status: AC
  Administered 2016-04-08: 7.5 mg via ORAL

## 2016-04-08 MED ORDER — NEOMYCIN-POLYMYXIN B GU 40-200000 IR SOLN
Status: AC
  Filled 2016-04-08: qty 1

## 2016-04-08 MED ORDER — MIDAZOLAM HCL 5 MG/5ML IJ SOLN
2.0000 mg | Freq: Once | INTRAMUSCULAR | Status: AC
  Administered 2016-04-08: 2 mg via INTRAVENOUS

## 2016-04-08 MED ORDER — ONDANSETRON HCL 4 MG/2ML IJ SOLN
INTRAMUSCULAR | Status: DC | PRN
  Administered 2016-04-08: 4 mg via INTRAVENOUS

## 2016-04-08 MED ORDER — BUPIVACAINE-EPINEPHRINE (PF) 0.25% -1:200000 IJ SOLN
INTRAMUSCULAR | Status: DC | PRN
  Administered 2016-04-08 (×2): 30 mL via PERINEURAL

## 2016-04-08 MED ORDER — FENTANYL CITRATE (PF) 100 MCG/2ML IJ SOLN
INTRAMUSCULAR | Status: DC
Start: 2016-04-08 — End: 2016-04-08
  Filled 2016-04-08: qty 2

## 2016-04-08 MED ORDER — GABAPENTIN 400 MG PO CAPS
400.0000 mg | ORAL_CAPSULE | Freq: Once | ORAL | Status: AC
  Administered 2016-04-08: 400 mg via ORAL

## 2016-04-08 MED ORDER — NEOMYCIN-POLYMYXIN B GU 40-200000 IR SOLN
Status: DC | PRN
  Administered 2016-04-08: 2 mL

## 2016-04-08 MED ORDER — CEFAZOLIN SODIUM-DEXTROSE 2-4 GM/100ML-% IV SOLN
2.0000 g | INTRAVENOUS | Status: AC
  Administered 2016-04-08: 2 g via INTRAVENOUS

## 2016-04-08 MED ORDER — SODIUM CHLORIDE 0.9 % IV SOLN
INTRAVENOUS | Status: DC
  Administered 2016-04-08 (×2): via INTRAVENOUS

## 2016-04-08 MED ORDER — CLINDAMYCIN PHOSPHATE 600 MG/50ML IV SOLN
INTRAVENOUS | Status: AC
  Administered 2016-04-08: 600 mg via INTRAVENOUS
  Filled 2016-04-08: qty 50

## 2016-04-08 MED ORDER — MELOXICAM 15 MG PO TABS: 15.0000 mg | ORAL_TABLET | Freq: Every day | ORAL | Status: DC

## 2016-04-08 MED ORDER — DEXAMETHASONE SODIUM PHOSPHATE 10 MG/ML IJ SOLN
INTRAMUSCULAR | Status: DC | PRN
  Administered 2016-04-08: 5 mg via INTRAVENOUS

## 2016-04-08 MED ORDER — OXYCODONE HCL 5 MG PO TABS: 5.0000 mg | ORAL_TABLET | Freq: Once | ORAL | Status: DC | PRN

## 2016-04-08 MED ORDER — CEFAZOLIN SODIUM-DEXTROSE 2-4 GM/100ML-% IV SOLN
INTRAVENOUS | Status: AC
  Filled 2016-04-08: qty 100

## 2016-04-08 MED ORDER — FENTANYL CITRATE (PF) 100 MCG/2ML IJ SOLN
INTRAMUSCULAR | Status: DC | PRN
  Administered 2016-04-08 (×2): 50 ug via INTRAVENOUS

## 2016-04-08 MED ORDER — PROPOFOL 10 MG/ML IV BOLUS
INTRAVENOUS | Status: DC | PRN
  Administered 2016-04-08: 180 mg via INTRAVENOUS

## 2016-04-08 MED ORDER — FENTANYL CITRATE (PF) 100 MCG/2ML IJ SOLN: 25.0000 ug | INTRAMUSCULAR | Status: DC | PRN

## 2016-04-08 MED ORDER — CHLORHEXIDINE GLUCONATE 4 % EX LIQD: 1.0000 "application " | Freq: Once | CUTANEOUS | Status: DC

## 2016-04-08 MED ORDER — LIDOCAINE HCL (PF) 1 % IJ SOLN
INTRAMUSCULAR | Status: AC
  Administered 2016-04-08: 5 mL
  Filled 2016-04-08: qty 5

## 2016-04-08 MED ORDER — ROPIVACAINE HCL 5 MG/ML IJ SOLN
INTRAMUSCULAR | Status: AC
  Administered 2016-04-08: 30 mL via PERINEURAL
  Filled 2016-04-08: qty 40

## 2016-04-08 MED ORDER — EPINEPHRINE HCL 1 MG/ML IJ SOLN
INTRAMUSCULAR | Status: AC
  Filled 2016-04-08: qty 1

## 2016-04-08 MED ORDER — HYDROCODONE-ACETAMINOPHEN 7.5-325 MG PO TABS: 1.0000 | ORAL_TABLET | Freq: Four times a day (QID) | ORAL | Status: DC | PRN

## 2016-04-08 MED ORDER — LIDOCAINE HCL (CARDIAC) 20 MG/ML IV SOLN
INTRAVENOUS | Status: DC | PRN
  Administered 2016-04-08: 100 mg via INTRAVENOUS

## 2016-04-08 MED ORDER — MELOXICAM 7.5 MG PO TABS
ORAL_TABLET | ORAL | Status: AC
  Administered 2016-04-08: 7.5 mg via ORAL
  Filled 2016-04-08: qty 1

## 2016-04-08 SURGICAL SUPPLY — 57 items
ADAPTER IRRIG TUBE 2 SPIKE SOL (ADAPTER) ×5 IMPLANT
ADPR TBG 2 SPK PMP STRL ASCP (ADAPTER) ×2
BLADE AGGRESSIVE PLUS 4.0 (BLADE) ×3 IMPLANT
BUR AGGRESSIVE+ 5.5 (BURR) ×1 IMPLANT
BUR BR 5.5 12 FLUTE (BURR) ×1 IMPLANT
BUR RADIUS 4.0X18.5 (BURR) ×3 IMPLANT
BUR RADIUS 5.5 (BURR) ×3 IMPLANT
CANNULA 5.75X7 CRYSTAL CLEAR (CANNULA) ×3 IMPLANT
CANNULA 8.5X75 THRED (CANNULA) ×3 IMPLANT
CANNULA PARTIAL THREAD 2X7 (CANNULA) ×3 IMPLANT
CHLORAPREP W/TINT 26ML (MISCELLANEOUS) ×3 IMPLANT
CONNECTOR PERFECT PASSER (CONNECTOR) ×1 IMPLANT
COVER MAYO STAND STRL (DRAPES) ×3 IMPLANT
DRAPE IMP U-DRAPE 54X76 (DRAPES) ×3 IMPLANT
DRAPE SHEET LG 3/4 BI-LAMINATE (DRAPES) ×3 IMPLANT
DRAPE STERI 35X30 U-POUCH (DRAPES) ×3 IMPLANT
GAUZE PETRO XEROFOAM 1X8 (MISCELLANEOUS) ×3 IMPLANT
GAUZE SPONGE 4X4 12PLY STRL (GAUZE/BANDAGES/DRESSINGS) ×3 IMPLANT
GLOVE SURG ORTHO 8.0 STRL STRW (GLOVE) ×3 IMPLANT
GOWN STRL REUS W/ TWL LRG LVL4 (GOWN DISPOSABLE) ×1 IMPLANT
GOWN STRL REUS W/TWL LRG LVL4 (GOWN DISPOSABLE) ×6
IV LACTATED RINGER IRRG 3000ML (IV SOLUTION) ×18
IV LR IRRIG 3000ML ARTHROMATIC (IV SOLUTION) ×1 IMPLANT
KIT RM TURNOVER STRD PROC AR (KITS) ×3 IMPLANT
KIT SHOULDER TRACTION (DRAPES) ×3 IMPLANT
MANIFOLD NEPTUNE II (INSTRUMENTS) ×3 IMPLANT
MAT BLUE FLOOR 46X72 FLO (MISCELLANEOUS) ×3 IMPLANT
MULTI S KNOTLES FIXATION 5.5 (Orthopedic Implant) ×2 IMPLANT
NDL SAFETY 18GX1.5 (NEEDLE) ×3 IMPLANT
NDL SPNL 18GX3.5 QUINCKE PK (NEEDLE) ×1 IMPLANT
NEEDLE SPNL 18GX3.5 QUINCKE PK (NEEDLE) ×3 IMPLANT
NS IRRIG 500ML POUR BTL (IV SOLUTION) ×3 IMPLANT
PACK ARTHROSCOPY SHOULDER (MISCELLANEOUS) ×3 IMPLANT
PASSER SUT CAPTURE FIRST (SUTURE) ×3 IMPLANT
SET TUBE SUCT SHAVER OUTFL 24K (TUBING) ×3 IMPLANT
SET TUBE TIP INTRA-ARTICULAR (MISCELLANEOUS) ×3 IMPLANT
SLING ULTRA II LG (MISCELLANEOUS) ×3 IMPLANT
SOL PREP PVP 2OZ (MISCELLANEOUS) ×3
SOLUTION PREP PVP 2OZ (MISCELLANEOUS) ×1 IMPLANT
STAPLER SKIN PROX 35W (STAPLE) ×2 IMPLANT
SUT ETHILON 3 0 FSLX (SUTURE) ×3 IMPLANT
SUT PDS PLUS 0 (SUTURE) ×2
SUT PDS PLUS AB 0 CT-2 (SUTURE) ×1 IMPLANT
SUT PERFECTPASSER WHITE CART (SUTURE) ×1 IMPLANT
SUT SMART STITCH CARTRIDGE (SUTURE) ×1 IMPLANT
SUT VIC AB 0 CT1 36 (SUTURE) ×2 IMPLANT
SUT VIC AB 2-0 CT2 27 (SUTURE) ×3 IMPLANT
SUT VICRYL 3-0 27IN (SUTURE) ×1 IMPLANT
SUTURE MAGNUM WIRE 2X48 BLK (SUTURE) ×17 IMPLANT
SYR 20CC LL (SYRINGE) ×3 IMPLANT
SYR 30ML LL (SYRINGE) ×3 IMPLANT
SYR 50ML LL SCALE MARK (SYRINGE) ×3 IMPLANT
TUBING ARTHRO INFLOW-ONLY STRL (TUBING) ×3 IMPLANT
TUBING CONNECTING 10 (TUBING) ×2 IMPLANT
TUBING CONNECTING 10' (TUBING) ×1
WAND HAND CNTRL MULTIVAC 90 (MISCELLANEOUS) ×3 IMPLANT
WIRE MAGNUM (SUTURE) ×4 IMPLANT

## 2016-04-08 NOTE — Anesthesia Preprocedure Evaluation (Signed)
Anesthesia Evaluation  Patient identified by MRN, date of birth, ID band Patient awake    Reviewed: Allergy & Precautions, H&P , NPO status , Patient's Chart, lab work & pertinent test results, reviewed documented beta blocker date and time   History of Anesthesia Complications Negative for: history of anesthetic complications  Airway Mallampati: II  TM Distance: >3 FB Neck ROM: full    Dental no notable dental hx. (+) Edentulous Upper, Edentulous Lower   Pulmonary neg shortness of breath, neg sleep apnea, neg COPD, neg recent URI, Current Smoker,    Pulmonary exam normal breath sounds clear to auscultation       Cardiovascular Exercise Tolerance: Good hypertension, (-) angina+ CAD, + Past MI, + CABG and + Peripheral Vascular Disease  (-) Cardiac Stents Normal cardiovascular exam(-) dysrhythmias (-) Valvular Problems/Murmurs Rhythm:regular Rate:Normal     Neuro/Psych PSYCHIATRIC DISORDERS (Depression) negative neurological ROS     GI/Hepatic Neg liver ROS, PUD, GERD  ,  Endo/Other  diabetes  Renal/GU negative Renal ROS  negative genitourinary   Musculoskeletal   Abdominal   Peds  Hematology negative hematology ROS (+)   Anesthesia Other Findings Past Medical History:   Hypertension                                                 Coronary artery disease                                      Depression                                                   Diabetes mellitus without complication ([place])                   Comment:diet controlled   GERD (gastroesophageal reflux disease)                       Hyperlipidemia                                               Insomnia                                                     Peripheral vascular disease (HCC)                            Stomach ulcer                                                History of MRSA infection  Reproductive/Obstetrics negative OB ROS                             Anesthesia Physical Anesthesia Plan  ASA: III  Anesthesia Plan: General   Post-op Pain Management:    Induction:   Airway Management Planned:   Additional Equipment:   Intra-op Plan:   Post-operative Plan:   Informed Consent: I have reviewed the patients History and Physical, chart, labs and discussed the procedure including the risks, benefits and alternatives for the proposed anesthesia with the patient or authorized representative who has indicated his/her understanding and acceptance.   Dental Advisory Given  Plan Discussed with: Anesthesiologist, CRNA and Surgeon  Anesthesia Plan Comments:         Anesthesia Quick Evaluation

## 2016-04-08 NOTE — Discharge Instructions (Signed)

## 2016-04-08 NOTE — Anesthesia Procedure Notes (Addendum)
Anesthesia Regional Block:  Interscalene brachial plexus block  Pre-Anesthetic Checklist: ,, timeout performed, Correct Patient, Correct Site, Correct Laterality, Correct Procedure, Correct Position, site marked, Risks and benefits discussed,  Surgical consent,  Pre-op evaluation,  At surgeon's request and post-op pain management  Laterality: Right and Upper  Prep: chloraprep       Needles:  Injection technique: Single-shot  Needle Type: Stimiplex     Needle Length: 10cm 10 cm Needle Gauge: 22 and 22 G    Additional Needles:  Procedures: ultrasound guided (picture in chart) Interscalene brachial plexus block Narrative:  Start time: 04/08/2016 1:14 PM End time: 04/08/2016 1:18 PM Injection made incrementally with aspirations every 5 mL.  Performed by: Personally  Anesthesiologist: Martha Clan  Additional Notes: Functioning IV was confirmed and monitors were applied.  A 24mm 22ga Stimuplex needle was used. Sterile prep and drape,hand hygiene and sterile gloves were used.  Negative aspiration and negative test dose prior to incremental administration of local anesthetic. The patient tolerated the procedure well.      Procedure Name: Intubation Date/Time: 04/08/2016 1:30 PM Performed by: Doreen Salvage Pre-anesthesia Checklist: Patient identified, Patient being monitored, Timeout performed, Emergency Drugs available and Suction available Patient Re-evaluated:Patient Re-evaluated prior to inductionOxygen Delivery Method: Circle system utilized Preoxygenation: Pre-oxygenation with 100% oxygen Intubation Type: IV induction Ventilation: Mask ventilation without difficulty Laryngoscope Size: Mac and 3 Grade View: Grade I Tube type: Oral Tube size: 7.5 mm Number of attempts: 1 Airway Equipment and Method: Stylet Placement Confirmation: ETT inserted through vocal cords under direct vision,  positive ETCO2 and breath sounds checked- equal and bilateral Secured at: 22 cm Tube  secured with: Tape Dental Injury: Teeth and Oropharynx as per pre-operative assessment

## 2016-04-08 NOTE — H&P (Signed)
THE PATIENT WAS SEEN PRIOR TO SURGERY TODAY.  HISTORY, ALLERGIES, HOME MEDICATIONS AND OPERATIVE PROCEDURE WERE REVIEWED. RISKS AND BENEFITS OF SURGERY DISCUSSED WITH PATIENT AGAIN.  NO CHANGES FROM INITIAL HISTORY AND PHYSICAL NOTED.    

## 2016-04-08 NOTE — Transfer of Care (Signed)
Immediate Anesthesia Transfer of Care Note  Patient: [name]  Procedure(s) Performed: Procedure(s): SHOULDER ARTHROSCOPY WITH OPEN ROTATOR CUFF REPAIR (Right)  Patient Location: PACU  Anesthesia Type:General  Level of Consciousness: sedated  Airway & Oxygen Therapy: Patient Spontanous Breathing and Patient connected to face mask oxygen  Post-op Assessment: Report given to RN and Post -op Vital signs reviewed and stable  Post vital signs: Reviewed and stable  Last Vitals:  Filed Vitals:   04/08/16 1300 04/08/16 1630  BP: 143/81 115/71  Pulse: 61 72  Temp:  36.4 C  Resp:  18    Complications: No apparent anesthesia complications

## 2016-04-08 NOTE — Op Note (Signed)
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  ANESTHESIA:   General plus interscalene block  PREOPERATIVE INDICATIONS:  [name] is a  60 y.o. 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    The risks benefits and alternatives were discussed with the patient preoperatively including but not limited to the risks of infection, bleeding, nerve injury, cardiopulmonary complications, the need for revision surgery, among others, and the patient was willing to proceed.  OPERATIVE IMPLANTS: One [employer] multi fix anchor  OPERATIVE FINDINGS: The patient had severe subacromial bursitis and impingement. The anterior acromion was prominent. The ACjoint was arthritic. The glenohumeral joint was intact.  The rotator cuff was torn through the supraspinatus and infraspinatus.  It was retracted beyond the glenoid.  The biceps tendon was severely frayed.  The labrum was frayed as well.  OPERATIVE PROCEDURE: The patient was brought to the operating room where satisfactory general endotracheal and interscalene anesthesia were accomplished.The patient was turned into the lateral decubitus position and the shoulder was prepped and draped in a sterile fashion. Arthroscopy was carried out from a posterior portal with accessory portals laterally and anteriorly. The  joint was examined first. The above findings were encountered. The motorized shaver was introduced anteriorly and the undersurface of the rotator cuff probed and lightly debrided. The biceps tendon was  severely  frayed.  The ArthroCare wand was introduced and the biceps tendon was released completely. The labrum was trimmed up with the ArthroCare wand as well. The arthroscope was redirected into the subacromial space. There was severe bursitis which was resected with the motorized shaver and ArthroCare wand. The large bur was introduced from a posterior portal and the anterior acromion was debrided. The undersurface of the clavicle was debrided with the bur which was then reintroduced from an anterior portal and the remaining distal clavicle completely excised.  Because of the severe complex nature and retraction of the tear, I elected to open the shoulder.  A short saber type incision was made just off the acromion and dissection carried out through subcutaneous tissue.  The deltoid was split bluntly at near the anterior edge of the acromion and the joint exposed.  The bursa was excised.  Self retaining retractors were inserted.  The rotator cuff tendon was freed up superiorly and inferiorly.  Multiple Magnum wire sutures were then inserted in a convergence pattern and tied to pull the posterior and anterior cuff together.  This was brought out laterally to the point of the rotator cuff insertion.  A multi fix anchor was inserted laterally and 2 Magnum wire sutures were passed through the tendon, anterior and posteriorly and fixed to the bone with the multi fix anchor.  This provided satisfactory coverage of the head.  Final debridement was carried out with the motorized shaver and the ArthroCare wand. The joint was flushed and the stab wounds and closed with 3-0 nylon suture. Sponge and needle counts were correct.   The dry sterile dressing and was applied along with a padded sling. Patient was awakened and taken recovery in good condition.   Shane Dess.D.

## 2016-04-09 ENCOUNTER — Encounter: Payer: Self-pay | Admitting: Specialist

## 2016-04-09 NOTE — Anesthesia Postprocedure Evaluation (Signed)
Anesthesia Post Note  Patient: [name]  Procedure(s) Performed: Procedure(s) (LRB): SHOULDER ARTHROSCOPY WITH OPEN ROTATOR CUFF REPAIR (Right)  Patient location during evaluation: PACU Anesthesia Type: General and Regional Level of consciousness: awake and alert Pain management: pain level controlled Vital Signs Assessment: post-procedure vital signs reviewed and stable Respiratory status: spontaneous breathing, nonlabored ventilation, respiratory function stable and patient connected to nasal cannula oxygen Cardiovascular status: blood pressure returned to baseline and stable Postop Assessment: no signs of nausea or vomiting Anesthetic complications: no    Last Vitals:  Filed Vitals:   04/08/16 1745 04/08/16 1801  BP: 132/71 128/73  Pulse: 62 63  Temp: 36.9 C   Resp: 16 16    Last Pain:  Filed Vitals:   04/09/16 0837  PainSc: 0-No pain                 Martha Clan

## 2016-04-18 DIAGNOSIS — S43421D Sprain of right rotator cuff capsule, subsequent encounter: Secondary | ICD-10-CM | POA: Diagnosis not present

## 2016-04-18 DIAGNOSIS — M19011 Primary osteoarthritis, right shoulder: Secondary | ICD-10-CM | POA: Diagnosis not present

## 2016-04-18 DIAGNOSIS — M7541 Impingement syndrome of right shoulder: Secondary | ICD-10-CM | POA: Diagnosis not present

## 2016-04-18 DIAGNOSIS — M7521 Bicipital tendinitis, right shoulder: Secondary | ICD-10-CM | POA: Diagnosis not present

## 2016-05-21 DIAGNOSIS — M25611 Stiffness of right shoulder, not elsewhere classified: Secondary | ICD-10-CM | POA: Diagnosis not present

## 2016-05-21 DIAGNOSIS — M25511 Pain in right shoulder: Secondary | ICD-10-CM | POA: Diagnosis not present

## 2016-05-24 DIAGNOSIS — M25511 Pain in right shoulder: Secondary | ICD-10-CM | POA: Diagnosis not present

## 2016-05-24 DIAGNOSIS — M25611 Stiffness of right shoulder, not elsewhere classified: Secondary | ICD-10-CM | POA: Diagnosis not present

## 2016-05-30 DIAGNOSIS — M6281 Muscle weakness (generalized): Secondary | ICD-10-CM | POA: Diagnosis not present

## 2016-05-31 DIAGNOSIS — M6281 Muscle weakness (generalized): Secondary | ICD-10-CM | POA: Diagnosis not present

## 2016-08-07 DIAGNOSIS — M6281 Muscle weakness (generalized): Secondary | ICD-10-CM | POA: Diagnosis not present

## 2016-08-08 ENCOUNTER — Other Ambulatory Visit: Payer: Self-pay | Admitting: Specialist

## 2016-08-08 DIAGNOSIS — M6281 Muscle weakness (generalized): Secondary | ICD-10-CM

## 2016-08-08 DIAGNOSIS — M25511 Pain in right shoulder: Secondary | ICD-10-CM

## 2016-08-09 DIAGNOSIS — Z0000 Encounter for general adult medical examination without abnormal findings: Secondary | ICD-10-CM | POA: Diagnosis not present

## 2016-08-09 DIAGNOSIS — F329 Major depressive disorder, single episode, unspecified: Secondary | ICD-10-CM | POA: Diagnosis not present

## 2016-08-09 DIAGNOSIS — Z125 Encounter for screening for malignant neoplasm of prostate: Secondary | ICD-10-CM | POA: Diagnosis not present

## 2016-08-09 DIAGNOSIS — Z6829 Body mass index (BMI) 29.0-29.9, adult: Secondary | ICD-10-CM | POA: Diagnosis not present

## 2016-08-09 DIAGNOSIS — F17200 Nicotine dependence, unspecified, uncomplicated: Secondary | ICD-10-CM | POA: Diagnosis not present

## 2016-08-09 DIAGNOSIS — E119 Type 2 diabetes mellitus without complications: Secondary | ICD-10-CM | POA: Diagnosis not present

## 2016-08-09 DIAGNOSIS — I2510 Atherosclerotic heart disease of native coronary artery without angina pectoris: Secondary | ICD-10-CM | POA: Diagnosis not present

## 2016-08-09 DIAGNOSIS — I10 Essential (primary) hypertension: Secondary | ICD-10-CM | POA: Diagnosis not present

## 2016-08-20 ENCOUNTER — Ambulatory Visit
Admission: RE | Admit: 2016-08-20 | Discharge: 2016-08-20 | Disposition: A | Discharge status: Home / Self Care | Payer: Medicare Other | Source: Ambulatory Visit | Attending: Specialist | Admitting: Specialist

## 2016-08-20 DIAGNOSIS — M75101 Unspecified rotator cuff tear or rupture of right shoulder, not specified as traumatic: Secondary | ICD-10-CM | POA: Insufficient documentation

## 2016-08-20 DIAGNOSIS — M75111 Incomplete rotator cuff tear or rupture of right shoulder, not specified as traumatic: Secondary | ICD-10-CM | POA: Diagnosis not present

## 2016-08-20 DIAGNOSIS — M6281 Muscle weakness (generalized): Secondary | ICD-10-CM

## 2016-08-20 DIAGNOSIS — Z9889 Other specified postprocedural states: Secondary | ICD-10-CM | POA: Diagnosis not present

## 2016-08-20 DIAGNOSIS — M25511 Pain in right shoulder: Secondary | ICD-10-CM

## 2016-08-23 DIAGNOSIS — M75101 Unspecified rotator cuff tear or rupture of right shoulder, not specified as traumatic: Secondary | ICD-10-CM | POA: Diagnosis not present

## 2016-08-28 DIAGNOSIS — D72829 Elevated white blood cell count, unspecified: Secondary | ICD-10-CM | POA: Diagnosis not present

## 2016-09-02 DIAGNOSIS — M75121 Complete rotator cuff tear or rupture of right shoulder, not specified as traumatic: Secondary | ICD-10-CM | POA: Diagnosis not present

## 2016-10-14 DIAGNOSIS — Z720 Tobacco use: Secondary | ICD-10-CM | POA: Diagnosis not present

## 2016-10-14 DIAGNOSIS — E782 Mixed hyperlipidemia: Secondary | ICD-10-CM | POA: Diagnosis not present

## 2016-10-14 DIAGNOSIS — I10 Essential (primary) hypertension: Secondary | ICD-10-CM | POA: Diagnosis not present

## 2016-10-14 DIAGNOSIS — I25810 Atherosclerosis of coronary artery bypass graft(s) without angina pectoris: Secondary | ICD-10-CM | POA: Diagnosis not present

## 2016-10-25 DIAGNOSIS — Z23 Encounter for immunization: Secondary | ICD-10-CM | POA: Diagnosis not present

## 2016-10-25 DIAGNOSIS — Z6828 Body mass index (BMI) 28.0-28.9, adult: Secondary | ICD-10-CM | POA: Diagnosis not present

## 2016-10-25 DIAGNOSIS — F329 Major depressive disorder, single episode, unspecified: Secondary | ICD-10-CM | POA: Diagnosis not present

## 2016-10-25 DIAGNOSIS — Z720 Tobacco use: Secondary | ICD-10-CM | POA: Diagnosis not present

## 2016-10-25 DIAGNOSIS — I2510 Atherosclerotic heart disease of native coronary artery without angina pectoris: Secondary | ICD-10-CM | POA: Diagnosis not present

## 2017-12-08 DIAGNOSIS — R202 Paresthesia of skin: Secondary | ICD-10-CM | POA: Insufficient documentation

## 2017-12-08 DIAGNOSIS — R200 Anesthesia of skin: Secondary | ICD-10-CM | POA: Insufficient documentation

## 2017-12-08 DIAGNOSIS — E559 Vitamin D deficiency, unspecified: Secondary | ICD-10-CM | POA: Insufficient documentation

## 2017-12-08 DIAGNOSIS — E538 Deficiency of other specified B group vitamins: Secondary | ICD-10-CM | POA: Insufficient documentation

## 2017-12-19 DIAGNOSIS — M5412 Radiculopathy, cervical region: Secondary | ICD-10-CM | POA: Insufficient documentation

## 2017-12-19 DIAGNOSIS — G5602 Carpal tunnel syndrome, left upper limb: Secondary | ICD-10-CM | POA: Insufficient documentation

## 2017-12-26 ENCOUNTER — Other Ambulatory Visit: Payer: Self-pay | Admitting: Neurology

## 2017-12-26 DIAGNOSIS — M5412 Radiculopathy, cervical region: Secondary | ICD-10-CM

## 2018-01-02 ENCOUNTER — Ambulatory Visit
Admission: RE | Admit: 2018-01-02 | Discharge: 2018-01-02 | Disposition: A | Discharge status: Home / Self Care | Payer: Medicare Other | Source: Ambulatory Visit | Attending: Neurology | Admitting: Neurology

## 2018-01-02 DIAGNOSIS — Z981 Arthrodesis status: Secondary | ICD-10-CM | POA: Diagnosis not present

## 2018-01-02 DIAGNOSIS — M4802 Spinal stenosis, cervical region: Secondary | ICD-10-CM | POA: Diagnosis not present

## 2018-01-02 DIAGNOSIS — M5410 Radiculopathy, site unspecified: Secondary | ICD-10-CM | POA: Diagnosis present

## 2018-01-02 DIAGNOSIS — M5031 Other cervical disc degeneration,  high cervical region: Secondary | ICD-10-CM | POA: Diagnosis not present

## 2018-01-02 DIAGNOSIS — M5412 Radiculopathy, cervical region: Secondary | ICD-10-CM

## 2018-01-08 DIAGNOSIS — G959 Disease of spinal cord, unspecified: Secondary | ICD-10-CM | POA: Insufficient documentation

## 2018-01-20 ENCOUNTER — Encounter
Admission: RE | Admit: 2018-01-20 | Discharge: 2018-01-20 | Disposition: A | Discharge status: Home / Self Care | Payer: Medicare Other | Source: Ambulatory Visit | Attending: Neurosurgery | Admitting: Neurosurgery

## 2018-01-20 ENCOUNTER — Other Ambulatory Visit: Payer: Self-pay

## 2018-01-20 DIAGNOSIS — Z01810 Encounter for preprocedural cardiovascular examination: Secondary | ICD-10-CM | POA: Diagnosis present

## 2018-01-20 DIAGNOSIS — Z01812 Encounter for preprocedural laboratory examination: Secondary | ICD-10-CM | POA: Insufficient documentation

## 2018-01-20 DIAGNOSIS — R001 Bradycardia, unspecified: Secondary | ICD-10-CM | POA: Insufficient documentation

## 2018-01-20 HISTORY — DX: Angina pectoris, unspecified: I20.9

## 2018-01-20 LAB — DIFFERENTIAL
BASOS ABS: 0.1 10*3/uL (ref 0–0.1)
BASOS PCT: 1 %
EOS ABS: 0.4 10*3/uL (ref 0–0.7)
Eosinophils Relative: 4 %
Lymphocytes Relative: 32 %
Lymphs Abs: 3.5 10*3/uL (ref 1.0–3.6)
Monocytes Absolute: 0.5 10*3/uL (ref 0.2–1.0)
Monocytes Relative: 5 %
NEUTROS PCT: 58 %
Neutro Abs: 6.4 10*3/uL (ref 1.4–6.5)

## 2018-01-20 LAB — URINALYSIS, ROUTINE W REFLEX MICROSCOPIC
BILIRUBIN URINE: NEGATIVE
GLUCOSE, UA: NEGATIVE mg/dL
HGB URINE DIPSTICK: NEGATIVE
Ketones, ur: NEGATIVE mg/dL
Leukocytes, UA: NEGATIVE
NITRITE: NEGATIVE
PH: 6 (ref 5.0–8.0)
Protein, ur: NEGATIVE mg/dL
SPECIFIC GRAVITY, URINE: 1.015 (ref 1.005–1.030)

## 2018-01-20 LAB — BASIC METABOLIC PANEL
ANION GAP: 9 (ref 5–15)
BUN: 11 mg/dL (ref 6–20)
CALCIUM: 9 mg/dL (ref 8.9–10.3)
CO2: 25 mmol/L (ref 22–32)
Chloride: 105 mmol/L (ref 101–111)
Creatinine, Ser: 1.06 mg/dL (ref 0.61–1.24)
GLUCOSE: 87 mg/dL (ref 65–99)
Potassium: 3.7 mmol/L (ref 3.5–5.1)
Sodium: 139 mmol/L (ref 135–145)

## 2018-01-20 LAB — SURGICAL PCR SCREEN
MRSA, PCR: NEGATIVE
Staphylococcus aureus: NEGATIVE

## 2018-01-20 LAB — CBC
HEMATOCRIT: 45.2 % (ref 40.0–52.0)
HEMOGLOBIN: 15.5 g/dL (ref 13.0–18.0)
MCH: 32.5 pg (ref 26.0–34.0)
MCHC: 34.4 g/dL (ref 32.0–36.0)
MCV: 94.7 fL (ref 80.0–100.0)
PLATELETS: 153 10*3/uL (ref 150–440)
RBC: 4.77 MIL/uL (ref 4.40–5.90)
RDW: 14.1 % (ref 11.5–14.5)
WBC: 10.9 10*3/uL — AB (ref 3.8–10.6)

## 2018-01-20 LAB — APTT: aPTT: 36 seconds (ref 24–36)

## 2018-01-20 LAB — TYPE AND SCREEN
ABO/RH(D): O POS
ANTIBODY SCREEN: NEGATIVE

## 2018-01-20 LAB — PROTIME-INR
INR: 1.06
PROTHROMBIN TIME: 13.7 s (ref 11.4–15.2)

## 2018-01-20 NOTE — Patient Instructions (Addendum)
  Your procedure is scheduled on: Wednesday January 28, 2018 Report to Same Day Surgery 2nd floor medical mall ([facility] Entrance-take elevator on left to 2nd floor.  Check in with surgery information desk.) To find out your arrival time please call [phone] between 1PM - 3PM on Tuesday January 27, 2018  Remember: Instructions that are not followed completely may result in serious medical risk, up to and including death, or upon the discretion of your surgeon and anesthesiologist your surgery may need to be rescheduled.    _x___ 1. Do not eat food after midnight the night before your procedure. You may drink clear liquids up to 2 hours before you are scheduled to arrive at the hospital for your procedure.  Do not drink water within 2 hours of your scheduled arrival to the hospital.   No gum chewing or hard candies.     __x__ 2. No Alcohol for 24 hours before or after surgery.   __x__3. No Smoking or e-cigarettes for 24 prior to surgery.  Do not use any chewable tobacco products for at least 6 hour prior to surgery   ____  4. Bring all medications with you on the day of surgery if instructed.    __x__ 5. Notify your doctor if there is any change in your medical condition     (cold, fever, infections).   __x__6. On the morning of surgery brush your teeth with toothpaste and water.  You may rinse your mouth with mouth wash if you wish.  Do not swallow any toothpaste or mouthwash.   Do not wear jewelry.  Do not wear lotions, powders, or perfumes. You may wear deodorant.  Do not shave 48 hours prior to surgery. Men may shave face and neck.  Do not bring valuables to the hospital.    [facility] is not responsible for any belongings or valuables.               Contacts, dentures or bridgework may not be worn into surgery.  Leave your suitcase in the car. After surgery it may be brought to your room.  For patients admitted to the hospital, discharge time is determined by your treatment  team.    Patients discharged the day of surgery will not be allowed to drive home.  You will need someone to drive you home and stay with you the night of your procedure.    Please read over the following fact sheets that you were given:   [facility] Preparing for Surgery and or MRSA Information   _x___ Take anti-hypertensive listed below, cardiac, seizure, asthma, anti-reflux and psychiatric medicines. These include:  1. Amitriptyline/Elavil  2. Chantix  3. Citalopram/Celexa  4. Pravastatin/Pravachol  5. Lansoprazole/Prevacid  6. Flonase, Nitroglycerin as needed  No Lisinopril the day of surgery    _x___ Use CHG Soap or sage wipes as directed on instruction sheet   _x___ Use inhalers on the day of surgery and bring to hospital day of surgery.  _x___ Follow recommendations from Cardiologist, Pulmonologist or PCP regarding stopping Aspirin, Coumadin, Plavix ,Eliquis, Effient, or Pradaxa, and Pletal.   _x___Stop Anti-inflammatories such as Advil, Aleve, Ibuprofen, Motrin, Naproxen, Naprosyn, Goodies powders or aspirin products. OK to take Tylenol and Celebrex. Stop Aspirin Wednesday January 21, 2018   _x___ Stop supplements until after surgery Stop Fish Oil Wednesday January 21, 2018.  May continue Vitamin D, Vitamin B, and multivitamin.

## 2018-01-28 ENCOUNTER — Encounter
Admission: RE | Disposition: A | Discharge status: Home / Self Care | Payer: Self-pay | Source: Ambulatory Visit | Attending: Neurosurgery

## 2018-01-28 ENCOUNTER — Ambulatory Visit: Payer: Medicare Other | Admitting: Certified Registered"

## 2018-01-28 ENCOUNTER — Encounter: Payer: Self-pay | Admitting: Certified Registered"

## 2018-01-28 ENCOUNTER — Ambulatory Visit
Admission: RE | Admit: 2018-01-28 | Discharge: 2018-01-28 | Disposition: A | Discharge status: Home / Self Care | Payer: Medicare Other | Source: Ambulatory Visit | Attending: Neurosurgery | Admitting: Neurosurgery

## 2018-01-28 ENCOUNTER — Ambulatory Visit: Payer: Medicare Other

## 2018-01-28 DIAGNOSIS — I2510 Atherosclerotic heart disease of native coronary artery without angina pectoris: Secondary | ICD-10-CM | POA: Diagnosis not present

## 2018-01-28 DIAGNOSIS — Z419 Encounter for procedure for purposes other than remedying health state, unspecified: Secondary | ICD-10-CM

## 2018-01-28 DIAGNOSIS — I739 Peripheral vascular disease, unspecified: Secondary | ICD-10-CM | POA: Insufficient documentation

## 2018-01-28 DIAGNOSIS — G4700 Insomnia, unspecified: Secondary | ICD-10-CM | POA: Diagnosis not present

## 2018-01-28 DIAGNOSIS — M4712 Other spondylosis with myelopathy, cervical region: Secondary | ICD-10-CM | POA: Insufficient documentation

## 2018-01-28 DIAGNOSIS — F17210 Nicotine dependence, cigarettes, uncomplicated: Secondary | ICD-10-CM | POA: Diagnosis not present

## 2018-01-28 DIAGNOSIS — F329 Major depressive disorder, single episode, unspecified: Secondary | ICD-10-CM | POA: Insufficient documentation

## 2018-01-28 DIAGNOSIS — I10 Essential (primary) hypertension: Secondary | ICD-10-CM | POA: Insufficient documentation

## 2018-01-28 DIAGNOSIS — Z7982 Long term (current) use of aspirin: Secondary | ICD-10-CM | POA: Diagnosis not present

## 2018-01-28 DIAGNOSIS — E119 Type 2 diabetes mellitus without complications: Secondary | ICD-10-CM | POA: Diagnosis not present

## 2018-01-28 DIAGNOSIS — Z79899 Other long term (current) drug therapy: Secondary | ICD-10-CM | POA: Insufficient documentation

## 2018-01-28 DIAGNOSIS — E785 Hyperlipidemia, unspecified: Secondary | ICD-10-CM | POA: Diagnosis not present

## 2018-01-28 DIAGNOSIS — K219 Gastro-esophageal reflux disease without esophagitis: Secondary | ICD-10-CM | POA: Diagnosis not present

## 2018-01-28 DIAGNOSIS — Z951 Presence of aortocoronary bypass graft: Secondary | ICD-10-CM | POA: Insufficient documentation

## 2018-01-28 HISTORY — PX: ANTERIOR CERVICAL DECOMP/DISCECTOMY FUSION: SHX1161

## 2018-01-28 LAB — ABO/RH: ABO/RH(D): O POS

## 2018-01-28 LAB — GLUCOSE, CAPILLARY: GLUCOSE-CAPILLARY: 148 mg/dL — AB (ref 65–99)

## 2018-01-28 SURGERY — ANTERIOR CERVICAL DECOMPRESSION/DISCECTOMY FUSION 2 LEVELS
Anesthesia: General | Site: Spine Cervical | Wound class: Clean

## 2018-01-28 MED ORDER — ONDANSETRON HCL 4 MG/2ML IJ SOLN
INTRAMUSCULAR | Status: AC
  Filled 2018-01-28: qty 4

## 2018-01-28 MED ORDER — BACITRACIN 50000 UNITS IM SOLR
INTRAMUSCULAR | Status: AC
  Filled 2018-01-28: qty 1

## 2018-01-28 MED ORDER — ROCURONIUM BROMIDE 100 MG/10ML IV SOLN
INTRAVENOUS | Status: DC | PRN
  Administered 2018-01-28: 10 mg via INTRAVENOUS

## 2018-01-28 MED ORDER — VANCOMYCIN HCL IN DEXTROSE 1-5 GM/200ML-% IV SOLN
1000.0000 mg | Freq: Once | INTRAVENOUS | Status: AC
  Administered 2018-01-28: 1000 mg via INTRAVENOUS

## 2018-01-28 MED ORDER — SUCCINYLCHOLINE CHLORIDE 20 MG/ML IJ SOLN
INTRAMUSCULAR | Status: DC | PRN
  Administered 2018-01-28: 140 mg via INTRAVENOUS

## 2018-01-28 MED ORDER — DEXMEDETOMIDINE HCL 200 MCG/2ML IV SOLN
INTRAVENOUS | Status: DC | PRN
  Administered 2018-01-28: 8 ug via INTRAVENOUS

## 2018-01-28 MED ORDER — THROMBIN (RECOMBINANT) 5000 UNITS EX SOLR
CUTANEOUS | Status: AC
  Filled 2018-01-28: qty 5000

## 2018-01-28 MED ORDER — ONDANSETRON HCL 4 MG/2ML IJ SOLN: 4.0000 mg | Freq: Once | INTRAMUSCULAR | Status: DC | PRN

## 2018-01-28 MED ORDER — REMIFENTANIL HCL 1 MG IV SOLR
INTRAVENOUS | Status: DC | PRN
  Administered 2018-01-28: .1 ug/kg/min via INTRAVENOUS

## 2018-01-28 MED ORDER — BUPIVACAINE-EPINEPHRINE (PF) 0.5% -1:200000 IJ SOLN
INTRAMUSCULAR | Status: DC | PRN
  Administered 2018-01-28: 4 mL

## 2018-01-28 MED ORDER — OXYCODONE HCL 5 MG PO TABS
ORAL_TABLET | ORAL | Status: AC
  Filled 2018-01-28: qty 1

## 2018-01-28 MED ORDER — THROMBIN (RECOMBINANT) 5000 UNITS EX SOLR
CUTANEOUS | Status: DC | PRN
  Administered 2018-01-28: 5000 [IU] via TOPICAL

## 2018-01-28 MED ORDER — KETAMINE HCL 10 MG/ML IJ SOLN
INTRAMUSCULAR | Status: DC | PRN
  Administered 2018-01-28: 25 mg via INTRAVENOUS

## 2018-01-28 MED ORDER — ACETAMINOPHEN 10 MG/ML IV SOLN
INTRAVENOUS | Status: DC | PRN
  Administered 2018-01-28: 1000 mg via INTRAVENOUS

## 2018-01-28 MED ORDER — PROPOFOL 10 MG/ML IV BOLUS
INTRAVENOUS | Status: DC | PRN
  Administered 2018-01-28: 50 mg via INTRAVENOUS
  Administered 2018-01-28: 150 mg via INTRAVENOUS
  Administered 2018-01-28: 200 mg via INTRAVENOUS

## 2018-01-28 MED ORDER — DEXAMETHASONE SODIUM PHOSPHATE 10 MG/ML IJ SOLN
INTRAMUSCULAR | Status: AC
  Filled 2018-01-28: qty 1

## 2018-01-28 MED ORDER — ROCURONIUM BROMIDE 50 MG/5ML IV SOLN
INTRAVENOUS | Status: AC
  Filled 2018-01-28: qty 1

## 2018-01-28 MED ORDER — DEXAMETHASONE SODIUM PHOSPHATE 10 MG/ML IJ SOLN
INTRAMUSCULAR | Status: DC | PRN
  Administered 2018-01-28: 5 mg via INTRAVENOUS

## 2018-01-28 MED ORDER — PROPOFOL 500 MG/50ML IV EMUL
INTRAVENOUS | Status: DC | PRN
  Administered 2018-01-28: 75 ug/kg/min via INTRAVENOUS

## 2018-01-28 MED ORDER — LIDOCAINE HCL (PF) 2 % IJ SOLN
INTRAMUSCULAR | Status: AC
  Filled 2018-01-28: qty 10

## 2018-01-28 MED ORDER — SODIUM CHLORIDE 0.9 % IR SOLN
Status: DC | PRN
  Administered 2018-01-28: 1000 mL

## 2018-01-28 MED ORDER — HYDROMORPHONE HCL 1 MG/ML IJ SOLN
INTRAMUSCULAR | Status: AC
  Filled 2018-01-28: qty 2

## 2018-01-28 MED ORDER — SODIUM CHLORIDE FLUSH 0.9 % IV SOLN
INTRAVENOUS | Status: AC
  Filled 2018-01-28: qty 10

## 2018-01-28 MED ORDER — SODIUM CHLORIDE 0.9 % IV SOLN
INTRAVENOUS | Status: DC | PRN
  Administered 2018-01-28: 25 ug/min via INTRAVENOUS

## 2018-01-28 MED ORDER — EPHEDRINE SULFATE 50 MG/ML IJ SOLN
INTRAMUSCULAR | Status: DC | PRN
  Administered 2018-01-28 (×2): 10 mg via INTRAVENOUS

## 2018-01-28 MED ORDER — MIDAZOLAM HCL 2 MG/2ML IJ SOLN
INTRAMUSCULAR | Status: AC
  Filled 2018-01-28: qty 2

## 2018-01-28 MED ORDER — OXYCODONE HCL 5 MG PO TABS
5.0000 mg | ORAL_TABLET | ORAL | Status: DC | PRN
Start: 2018-01-28 — End: 2018-01-28
  Administered 2018-01-28: 5 mg via ORAL
  Filled 2018-01-28: qty 1

## 2018-01-28 MED ORDER — ACETAMINOPHEN 10 MG/ML IV SOLN
INTRAVENOUS | Status: AC
  Filled 2018-01-28: qty 100

## 2018-01-28 MED ORDER — SODIUM CHLORIDE 0.9 % IJ SOLN
INTRAMUSCULAR | Status: AC
  Filled 2018-01-28: qty 20

## 2018-01-28 MED ORDER — FENTANYL CITRATE (PF) 100 MCG/2ML IJ SOLN
INTRAMUSCULAR | Status: AC
  Administered 2018-01-28: 25 ug via INTRAVENOUS
  Filled 2018-01-28: qty 2

## 2018-01-28 MED ORDER — ONDANSETRON HCL 4 MG/2ML IJ SOLN
INTRAMUSCULAR | Status: DC | PRN
  Administered 2018-01-28: 4 mg via INTRAVENOUS

## 2018-01-28 MED ORDER — EPHEDRINE SULFATE 50 MG/ML IJ SOLN
INTRAMUSCULAR | Status: AC
  Filled 2018-01-28: qty 1

## 2018-01-28 MED ORDER — METHOCARBAMOL 500 MG PO TABS: 500.0000 mg | ORAL_TABLET | Freq: Four times a day (QID) | ORAL | 0 refills | Status: DC | PRN

## 2018-01-28 MED ORDER — PROPOFOL 500 MG/50ML IV EMUL
INTRAVENOUS | Status: AC
  Filled 2018-01-28: qty 100

## 2018-01-28 MED ORDER — PROPOFOL 10 MG/ML IV BOLUS
INTRAVENOUS | Status: AC
  Filled 2018-01-28: qty 40

## 2018-01-28 MED ORDER — PHENYLEPHRINE HCL 10 MG/ML IJ SOLN
INTRAMUSCULAR | Status: AC
  Filled 2018-01-28: qty 2

## 2018-01-28 MED ORDER — PROPOFOL 10 MG/ML IV BOLUS
INTRAVENOUS | Status: AC
  Filled 2018-01-28: qty 20

## 2018-01-28 MED ORDER — MIDAZOLAM HCL 2 MG/2ML IJ SOLN
INTRAMUSCULAR | Status: DC | PRN
  Administered 2018-01-28: 2 mg via INTRAVENOUS

## 2018-01-28 MED ORDER — LACTATED RINGERS IV SOLN
INTRAVENOUS | Status: DC
  Administered 2018-01-28: 10:00:00 via INTRAVENOUS

## 2018-01-28 MED ORDER — REMIFENTANIL HCL 1 MG IV SOLR
INTRAVENOUS | Status: AC
  Filled 2018-01-28: qty 1000

## 2018-01-28 MED ORDER — FENTANYL CITRATE (PF) 100 MCG/2ML IJ SOLN
25.0000 ug | INTRAMUSCULAR | Status: DC | PRN
  Administered 2018-01-28 (×2): 25 ug via INTRAVENOUS

## 2018-01-28 MED ORDER — OXYCODONE HCL 5 MG PO TABS: 5.0000 mg | ORAL_TABLET | ORAL | 0 refills | Status: DC | PRN

## 2018-01-28 MED ORDER — SUCCINYLCHOLINE CHLORIDE 20 MG/ML IJ SOLN
INTRAMUSCULAR | Status: AC
  Filled 2018-01-28: qty 1

## 2018-01-28 MED ORDER — LIDOCAINE HCL (CARDIAC) 20 MG/ML IV SOLN
INTRAVENOUS | Status: DC | PRN
  Administered 2018-01-28: 40 mg via INTRAVENOUS

## 2018-01-28 MED ORDER — HYDROMORPHONE HCL 1 MG/ML IJ SOLN
INTRAMUSCULAR | Status: DC | PRN
  Administered 2018-01-28 (×2): 1 mg via INTRAVENOUS

## 2018-01-28 MED ORDER — GLYCOPYRROLATE 0.2 MG/ML IJ SOLN
INTRAMUSCULAR | Status: DC | PRN
  Administered 2018-01-28 (×2): 0.1 mg via INTRAVENOUS

## 2018-01-28 MED ORDER — PROPOFOL 500 MG/50ML IV EMUL
INTRAVENOUS | Status: AC
  Filled 2018-01-28: qty 50

## 2018-01-28 MED ORDER — BUPIVACAINE-EPINEPHRINE (PF) 0.5% -1:200000 IJ SOLN
INTRAMUSCULAR | Status: AC
  Filled 2018-01-28: qty 30

## 2018-01-28 MED ORDER — GLYCOPYRROLATE 0.2 MG/ML IJ SOLN
INTRAMUSCULAR | Status: AC
  Filled 2018-01-28: qty 2

## 2018-01-28 MED ORDER — GELATIN ABSORBABLE 12-7 MM EX MISC
CUTANEOUS | Status: AC
  Filled 2018-01-28: qty 1

## 2018-01-28 MED ORDER — VANCOMYCIN HCL IN DEXTROSE 1-5 GM/200ML-% IV SOLN
INTRAVENOUS | Status: AC
  Filled 2018-01-28: qty 200

## 2018-01-28 SURGICAL SUPPLY — 61 items
ADH SKN CLS APL DERMABOND .7 (GAUZE/BANDAGES/DRESSINGS) ×1
AGENT HMST MTR 8 SURGIFLO (HEMOSTASIS) ×2
ALLOGRAFT LORDOTIC 8X11X14 (Bone Implant) ×2 IMPLANT
ALLOGRAFT LORDOTIC CC 7X11X14 (Bone Implant) ×2 IMPLANT
BLADE BOVIE TIP EXT 4 (BLADE) ×3 IMPLANT
BLADE SURG 15 STRL LF DISP TIS (BLADE) ×1 IMPLANT
BLADE SURG 15 STRL SS (BLADE) ×3
BUR NEURO DRILL SOFT 3.0X3.8M (BURR) ×3 IMPLANT
CANISTER SUCT 1200ML W/VALVE (MISCELLANEOUS) ×6 IMPLANT
CHLORAPREP W/TINT 26ML (MISCELLANEOUS) ×3 IMPLANT
CLOSURE WOUND 1/2 X4 (GAUZE/BANDAGES/DRESSINGS)
COUNTER NEEDLE 20/40 LG (NEEDLE) ×3 IMPLANT
COVER LIGHT HANDLE STERIS (MISCELLANEOUS) ×6 IMPLANT
CRADLE LAMINECT ARM (MISCELLANEOUS) ×3 IMPLANT
CUP MEDICINE 2OZ PLAST GRAD ST (MISCELLANEOUS) ×6 IMPLANT
DERMABOND ADVANCED (GAUZE/BANDAGES/DRESSINGS) ×2
DERMABOND ADVANCED .7 DNX12 (GAUZE/BANDAGES/DRESSINGS) ×1 IMPLANT
DRAPE C-ARM 42X72 X-RAY (DRAPES) ×6 IMPLANT
DRAPE INCISE IOBAN 66X45 STRL (DRAPES) ×3 IMPLANT
DRAPE LAPAROTOMY 77X122 PED (DRAPES) ×3 IMPLANT
DRAPE MICROSCOPE SPINE 48X150 (DRAPES) ×3 IMPLANT
DRAPE POUCH INSTRU U-SHP 10X18 (DRAPES) ×3 IMPLANT
DRAPE SHEET LG 3/4 BI-LAMINATE (DRAPES) ×3 IMPLANT
DRAPE SURG 17X11 SM STRL (DRAPES) ×12 IMPLANT
DRAPE TABLE BACK 80X90 (DRAPES) ×3 IMPLANT
ELECT CAUTERY BLADE TIP 2.5 (TIP) ×3
ELECTRODE CAUTERY BLDE TIP 2.5 (TIP) ×1 IMPLANT
FEE INTRAOP MONITOR IMPULS NCS (MISCELLANEOUS) IMPLANT
FRAME EYE SHIELD (PROTECTIVE WEAR) ×3 IMPLANT
GLOVE SURG SYN 8.5  E (GLOVE) ×6
GLOVE SURG SYN 8.5 E (GLOVE) ×3 IMPLANT
GLOVE SURG SYN 8.5 PF PI (GLOVE) ×3 IMPLANT
GOWN SRG XL LVL 3 NONREINFORCE (GOWNS) ×1 IMPLANT
GOWN STRL NON-REIN TWL XL LVL3 (GOWNS) ×3
GOWN STRL REUS W/ TWL LRG LVL3 (GOWN DISPOSABLE) ×1 IMPLANT
GOWN STRL REUS W/TWL LRG LVL3 (GOWN DISPOSABLE) ×3
GRADUATE 1200CC STRL 31836 (MISCELLANEOUS) ×3 IMPLANT
INTRAOP MONITOR FEE IMPULS NCS (MISCELLANEOUS) ×1
INTRAOP MONITOR FEE IMPULSE (MISCELLANEOUS) ×2
IV CATH ANGIO 12GX3 LT BLUE (NEEDLE) ×3 IMPLANT
KIT TURNOVER KIT A (KITS) ×3 IMPLANT
MARKER SKIN DUAL TIP RULER LAB (MISCELLANEOUS) ×6 IMPLANT
NEEDLE HYPO 22GX1.5 SAFETY (NEEDLE) ×3 IMPLANT
NS IRRIG 1000ML POUR BTL (IV SOLUTION) ×3 IMPLANT
PACK LAMINECTOMY NEURO (CUSTOM PROCEDURE TRAY) ×3 IMPLANT
PIN CASPAR 14 (PIN) ×1 IMPLANT
PIN CASPAR 14MM (PIN) ×3
PLATE ARCHON 2-LEVEL 36MM (Plate) ×2 IMPLANT
SCREW ARCHON SD VAR 4.0X15MM (Screw) ×12 IMPLANT
SPOGE SURGIFLO 8M (HEMOSTASIS) ×4
SPONGE KITTNER 5P (MISCELLANEOUS) ×3 IMPLANT
SPONGE SURGIFLO 8M (HEMOSTASIS) ×2 IMPLANT
STRIP CLOSURE SKIN 1/2X4 (GAUZE/BANDAGES/DRESSINGS) IMPLANT
SUT V-LOC 90 ABS DVC 3-0 CL (SUTURE) ×3 IMPLANT
SUT VIC AB 3-0 SH 8-18 (SUTURE) ×3 IMPLANT
SYR 30ML LL (SYRINGE) ×3 IMPLANT
TAPE CLOTH 3X10 WHT NS LF (GAUZE/BANDAGES/DRESSINGS) ×3 IMPLANT
TOWEL OR 17X26 4PK STRL BLUE (TOWEL DISPOSABLE) ×12 IMPLANT
TRAY FOLEY W/METER SILVER 16FR (SET/KITS/TRAYS/PACK) ×1 IMPLANT
TUBING CONNECTING 10 (TUBING) ×2 IMPLANT
TUBING CONNECTING 10' (TUBING) ×1

## 2018-01-28 NOTE — Anesthesia Postprocedure Evaluation (Signed)
Anesthesia Post Note  Patient: [name]  Procedure(s) Performed: ANTERIOR CERVICAL DECOMPRESSION/DISCECTOMY FUSION 2 LEVELS C3-5 (N/A Spine Cervical)  Patient location during evaluation: PACU Anesthesia Type: General Level of consciousness: awake and alert Pain management: pain level controlled Vital Signs Assessment: post-procedure vital signs reviewed and stable Respiratory status: spontaneous breathing, nonlabored ventilation, respiratory function stable and patient connected to nasal cannula oxygen Cardiovascular status: blood pressure returned to baseline and stable Postop Assessment: no apparent nausea or vomiting Anesthetic complications: no     Last Vitals:  Vitals:   01/28/18 1405 01/28/18 1414  BP: 130/72   Pulse: 77 80  Resp: 14 13  Temp:    SpO2: 99% 96%    Last Pain:  Vitals:   01/28/18 0938  TempSrc: Temporal                 Molli Barrows

## 2018-01-28 NOTE — Transfer of Care (Signed)
Immediate Anesthesia Transfer of Care Note  Patient: [name]  Procedure(s) Performed: ANTERIOR CERVICAL DECOMPRESSION/DISCECTOMY FUSION 2 LEVELS C3-5 (N/A Spine Cervical)  Patient Location: PACU  Anesthesia Type:General  Level of Consciousness: drowsy and patient cooperative  Airway & Oxygen Therapy: Patient Spontanous Breathing and Patient connected to face mask oxygen  Post-op Assessment: Report given to RN and Post -op Vital signs reviewed and stable  Post vital signs: Reviewed and stable  Last Vitals:  Vitals:   01/28/18 0938 01/28/18 1355  BP: (!) 158/71 125/70  Pulse: 64 82  Resp: 17 13  Temp: (!) 36.2 C 37.5 C  SpO2: 99% 99%    Last Pain:  Vitals:   01/28/18 0938  TempSrc: Temporal         Complications: No apparent anesthesia complications

## 2018-01-28 NOTE — H&P (Signed)
I have reviewed and confirmed my history and physical from 01/08/18 with no additions or changes. Plan for ACDF revision.  Risks and benefits reviewed.      Heart sounds normal no MRG. Chest Clear to Auscultation Bilaterally.

## 2018-01-28 NOTE — OR Nursing (Signed)
Ambulatory and void postop (also void pacu); total pacu/postop time 4 hrs as requested by MD, pt requests oxycodone for 7/10 right neck "aching" to to go home, "I just wanna get in my bed"

## 2018-01-28 NOTE — OR Nursing (Addendum)
Explanted 2 screws from cervical spine

## 2018-01-28 NOTE — Progress Notes (Signed)
Up to  Chair states back is sore and shoulders

## 2018-01-28 NOTE — Op Note (Signed)
  Indications: Mr. 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  He failed conservative management and elected for surgical intervention.  Findings: cervical stenosis at C3-4 and C4-5  Preoperative Diagnosis: Cervical myelopathy Postoperative Diagnosis: same   EBL: 50 ml IVF: 600 ml Drains: none Disposition: Extubated and Stable to PACU Complications: none  No foley catheter was placed.   Preoperative Note:   Risks of surgery discussed include: infection, bleeding, stroke, coma, death, paralysis, CSF leak, nerve/spinal cord injury, numbness, tingling, weakness, complex regional pain syndrome, recurrent stenosis and/or disc herniation, vascular injury, development of instability, neck/back pain, need for further surgery, persistent symptoms, development of deformity, and the risks of anesthesia. The patient understood these risks and agreed to proceed.   Procedure:  1) Anterior cervical diskectomy and fusion at C3/4 and C4/5 2) Anterior cervical instrumentation at C3 - 5 3) Placement of structural allograft  4) Use of operative microscope 5) Use of flouroscopy   Procedure: After obtaining informed consent, the patient taken to the operating room, placed in supine position, general anesthesia induced.  The patient had a small shoulder roll placed behind their shoulders.  The patient received preop antibiotics and IV Decadron.  The patient had a neck incision outlined, was prepped and draped in usual sterile fashion. The incision was injected with local anesthetic.   An incision was opened, dissection taken down medial to the carotid artery and jugular vein, lateral to the trachea and esophagus.  The prevertebral fascia identified, and the prior plate was identified, confirming the levels. The longus colli were dissected laterally, and self-retaining retractors placed to open the operative field. The microscope was then brought into the field.  With  this complete, distractor pins were placed in the vertebral bodies of C3 and C5. To place a pin in C5, one of the old screws was removed and that hole was used to place the caspar pin. The distractor was placed, and the anuli at C3/4 and C4/5 were opened using a bovie.  Curettes and pituitary rongeurs used to remove the majority of disk, then the drill was used to remove the posterior osteophyte and begin the foraminotomies. The nerve hook was used to elevate the posterior longitudinal ligament, which was then removed with Kerrison rongeurs. The microblunt nerve hook could be passed out the foramen bilaterally at each level.   Meticulous hemostasis was obtained.  Structural allograft coated with demineralized bone matrix was tapped behind the anterior lip of the vertebral body at C4/5 (8 mm) and C3/4 (7 mm).    The caspar distractor was removed, and bone wax used for hemostasis. A separate, 36 mm Nuvasive Archon plate was chosen.  Two screws placed in each vertebral body, respectively making sure the screws were behind the locking mechanism.  Final AP and lateral radiographs were taken.   With everything in good position, the wound was irrigated copiously with bacitracin-containing solution and meticulous hemostasis obtained.  Wound was closed in 2 layers using interrupted inverted 3-0 Vicryl sutures.  The wound was dressed with dermabond, the head of bed at 30 degrees, taken to recovery room in stable condition.  No new postop neurological deficits were identified.  Sponge and pattie counts were correct at the end of the procedure.   Marin Olp PA assisted throughout the procedure as an Pensions consultant.  Monitoring was used throughout with improvement in signals.  Meade Maw MD

## 2018-01-28 NOTE — Anesthesia Post-op Follow-up Note (Signed)
Anesthesia QCDR form completed.        

## 2018-01-28 NOTE — Anesthesia Preprocedure Evaluation (Signed)
Anesthesia Evaluation  Patient identified by MRN, date of birth, ID band Patient awake    Reviewed: Allergy & Precautions, H&P , NPO status , Patient's Chart, lab work & pertinent test results, reviewed documented beta blocker date and time   Airway Mallampati: III  TM Distance: <3 FB Neck ROM: full    Dental  (+) Teeth Intact   Pulmonary neg pulmonary ROS, Current Smoker,    Pulmonary exam normal        Cardiovascular Exercise Tolerance: Poor hypertension, On Medications + angina with exertion + CAD and + Peripheral Vascular Disease  negative cardio ROS Normal cardiovascular exam Rhythm:regular Rate:Normal     Neuro/Psych PSYCHIATRIC DISORDERS Depression negative neurological ROS  negative psych ROS   GI/Hepatic negative GI ROS, Neg liver ROS, PUD, GERD  ,  Endo/Other  negative endocrine ROSdiabetes, Well Controlled, Type 2  Renal/GU negative Renal ROS  negative genitourinary   Musculoskeletal   Abdominal   Peds  Hematology negative hematology ROS (+)   Anesthesia Other Findings Past Medical History: No date: Anginal pain (HCC) No date: Coronary artery disease No date: Depression No date: Diabetes mellitus without complication (HCC)     Comment:  diet controlled No date: GERD (gastroesophageal reflux disease) No date: History of MRSA infection No date: Hyperlipidemia No date: Hypertension No date: Insomnia No date: Peripheral vascular disease ([place]) No date: Stomach ulcer Past Surgical History: No date: COLON SURGERY No date: CORONARY ARTERY BYPASS GRAFT No date: neck fusion 04/08/2016: SHOULDER ARTHROSCOPY WITH OPEN ROTATOR CUFF REPAIR; Right     Comment:  Procedure: SHOULDER ARTHROSCOPY WITH OPEN ROTATOR CUFF               REPAIR;  Surgeon: Earnestine Leys, MD;  Location: [facility] ORS;              Service: Orthopedics;  Laterality: Right; No date: TONSILLECTOMY BMI    Body Mass Index:  25.80 kg/m     Reproductive/Obstetrics negative OB ROS                             Anesthesia Physical Anesthesia Plan  ASA: III  Anesthesia Plan: General ETT   Post-op Pain Management:    Induction:   PONV Risk Score and Plan:   Airway Management Planned:   Additional Equipment:   Intra-op Plan:   Post-operative Plan:   Informed Consent: I have reviewed the patients History and Physical, chart, labs and discussed the procedure including the risks, benefits and alternatives for the proposed anesthesia with the patient or authorized representative who has indicated his/her understanding and acceptance.   Dental Advisory Given  Plan Discussed with: CRNA  Anesthesia Plan Comments:         Anesthesia Quick Evaluation

## 2018-01-28 NOTE — Discharge Instructions (Addendum)
°Your surgeon has performed an operation on your cervical spine (neck) to relieve pressure on the spinal cord and/or nerves. This involved making an incision in the front of your neck and removing one or more of the discs that support your spine. Next, a small piece of bone, a titanium plate, and screws were used to fuse two or more of the vertebrae (bones) together. ° °The following are instructions to help in your recovery once you have been discharged from the hospital. Even if you feel well, it is important that you follow these activity guidelines. If you do not let your neck heal properly from the surgery, you can increase the chance of return of your symptoms and other complications. ° °* Do not take anti-inflammatory medications for 3 months after surgery (naproxen [Aleve], ibuprofen [Advil, Motrin], celecoxib [Celebrex], etc.). These medications can prevent your bones from healing properly. ° °Activity  °  °No bending, lifting, or twisting (“BLT”). Avoid lifting objects heavier than 10 pounds (gallon milk jug).  Where possible, avoid household activities that involve lifting, bending, reaching, pushing, or pulling such as laundry, vacuuming, grocery shopping, and childcare. Try to arrange for help from friends and family for these activities while your back heals. ° °Increase physical activity slowly as tolerated.  Taking short walks is encouraged, but avoid strenuous exercise. Do not jog, run, bicycle, lift weights, or participate in any other exercises unless specifically allowed by your doctor. ° °Talk to your doctor before resuming sexual activity. ° °You should not drive until cleared by your doctor. ° °Until released by your doctor, you should not return to work or school.  You should rest at home and let your body heal.  ° °You may shower three days after your surgery.  After showering, lightly dab your incision dry. Do not take a tub bath or go swimming until approved by your doctor at your  follow-up appointment. ° °If your doctor ordered a cervical collar (neck brace) for you, you should wear it whenever you are out of bed. You may remove it when lying down or sleeping, but you should wear it at all other times. Not all neck surgeries require a cervical collar. ° °If you smoke, we strongly recommend that you quit.  Smoking has been proven to interfere with normal bone healing and will dramatically reduce the success rate of your surgery. Please contact [facility] (800-QUIT-NOW) and use the resources at www.[facility].com for assistance in stopping smoking. ° °Surgical Incision °  °If you have a dressing on your incision, you may remove it two days after your surgery. Keep your incision area clean and dry. ° °If you have staples or stitches on your incision, you should have a follow up scheduled for removal. If you do not have staples or stitches, you will have steri-strips (small pieces of surgical tape) or Dermabond glue. The steri-strips/glue should begin to peel away within about a week (it is fine if the steri-strips fall off before then). If the strips are still in place one week after your surgery, you may gently remove them. ° °Diet          ° °You may return to your usual diet. However, you may experience discomfort when swallowing in the first month after your surgery. This is normal. You may find that softer foods are more comfortable for you to swallow. Be sure to stay hydrated. ° °When to Contact Us ° °You may experience pain in your neck and/or pain between your shoulder   blades. This is normal and should improve in the next few weeks with the help of pain medication, muscle relaxers, and rest. Some patients report that a warm compress on the back of the neck or between the shoulder blades helps. ° °However, should you experience any of the following, contact us immediately: °• New numbness or weakness °• Pain that is progressively getting worse, and is not relieved by your pain  medication, muscle relaxers, rest, and warm compresses °• Bleeding, redness, swelling, pain, or drainage from surgical incision °• Chills or flu-like symptoms °• Fever greater than 101.0 F (38.3 C) °• Inability to eat, drink fluids, or take medications °• Problems with bowel or bladder functions °• Difficulty breathing or shortness of breath °• Warmth, tenderness, or swelling in your calf °Contact Information °• During office hours (Monday-Friday 9 am to 5 pm), please call your physician at [phone] and ask for Kendelyn Jean °• After hours and weekends, please call the [facility] Operator at [phone] and ask for the Neurosurgery Resident On Call  °• For a life-threatening emergency, call 911 ° ° °AMBULATORY SURGERY  °DISCHARGE INSTRUCTIONS ° ° °1) The drugs that you were given will stay in your system until tomorrow so for the next 24 hours you should not: ° °A) Drive an automobile °B) Make any legal decisions °C) Drink any alcoholic beverage ° ° °2) You may resume regular meals tomorrow.  Today it is better to start with liquids and gradually work up to solid foods. ° °You may eat anything you prefer, but it is better to start with liquids, then soup and crackers, and gradually work up to solid foods. ° ° °3) Please notify your doctor immediately if you have any unusual bleeding, trouble breathing, redness and pain at the surgery site, drainage, fever, or pain not relieved by medication. ° ° ° °4) Additional Instructions: ° ° ° ° ° ° ° °Please contact your physician with any problems or Same Day Surgery at [phone], Monday through Friday 6 am to 4 pm, or [facility] Main number at [phone]. ° °

## 2018-01-28 NOTE — Anesthesia Procedure Notes (Addendum)
Procedure Name: Intubation Date/Time: 01/28/2018 10:57 AM Performed by: Nile Riggs, CRNA Pre-anesthesia Checklist: Emergency Drugs available, Patient identified, Suction available, Patient being monitored and Timeout performed Patient Re-evaluated:Patient Re-evaluated prior to induction Oxygen Delivery Method: Circle system utilized Preoxygenation: Pre-oxygenation with 100% oxygen Induction Type: IV induction Ventilation: Mask ventilation without difficulty Laryngoscope Size: McGraph and 4 Grade View: Grade I Tube type: Oral Tube size: 7.5 mm Number of attempts: 1 Airway Equipment and Method: Stylet and Video-laryngoscopy Placement Confirmation: ETT inserted through vocal cords under direct vision,  positive ETCO2,  CO2 detector and breath sounds checked- equal and bilateral Secured at: 22 cm Tube secured with: Tape Dental Injury: Teeth and Oropharynx as per pre-operative assessment

## 2018-01-29 ENCOUNTER — Encounter: Payer: Self-pay | Admitting: Neurosurgery

## 2018-10-20 ENCOUNTER — Other Ambulatory Visit: Payer: Self-pay

## 2018-10-20 NOTE — Patient Outreach (Signed)
[name]) Care Management  10/20/2018  [name] 1956/04/11 [id number]   Medication Adherence call to [name] left a message for patient to call back patient is due on Lisinopril 10 mg [name] is showing past due under [place].   [facility] Management Direct Dial [phone]  Fax [phone] [name]

## 2019-06-03 ENCOUNTER — Other Ambulatory Visit: Payer: Self-pay | Admitting: Neurosurgery

## 2019-06-03 DIAGNOSIS — Z981 Arthrodesis status: Secondary | ICD-10-CM

## 2019-06-03 DIAGNOSIS — G959 Disease of spinal cord, unspecified: Secondary | ICD-10-CM

## 2019-06-03 DIAGNOSIS — M4807 Spinal stenosis, lumbosacral region: Secondary | ICD-10-CM

## 2019-06-21 ENCOUNTER — Ambulatory Visit
Admission: RE | Admit: 2019-06-21 | Discharge: 2019-06-21 | Disposition: A | Discharge status: Home / Self Care | Payer: Medicare Other | Source: Ambulatory Visit | Attending: Neurosurgery | Admitting: Neurosurgery

## 2019-06-21 ENCOUNTER — Other Ambulatory Visit: Payer: Self-pay

## 2019-06-21 DIAGNOSIS — Z981 Arthrodesis status: Secondary | ICD-10-CM

## 2019-06-21 DIAGNOSIS — G959 Disease of spinal cord, unspecified: Secondary | ICD-10-CM | POA: Diagnosis present

## 2019-06-21 DIAGNOSIS — M4807 Spinal stenosis, lumbosacral region: Secondary | ICD-10-CM | POA: Insufficient documentation

## 2019-07-30 ENCOUNTER — Other Ambulatory Visit: Payer: Self-pay | Admitting: Neurosurgery

## 2019-08-11 ENCOUNTER — Encounter
Admission: RE | Admit: 2019-08-11 | Discharge: 2019-08-11 | Disposition: A | Discharge status: Home / Self Care | Payer: Medicare Other | Source: Ambulatory Visit | Attending: Neurosurgery | Admitting: Neurosurgery

## 2019-08-11 ENCOUNTER — Other Ambulatory Visit: Payer: Self-pay

## 2019-08-11 DIAGNOSIS — I2510 Atherosclerotic heart disease of native coronary artery without angina pectoris: Secondary | ICD-10-CM | POA: Diagnosis not present

## 2019-08-11 DIAGNOSIS — I10 Essential (primary) hypertension: Secondary | ICD-10-CM | POA: Insufficient documentation

## 2019-08-11 DIAGNOSIS — Z01818 Encounter for other preprocedural examination: Secondary | ICD-10-CM | POA: Insufficient documentation

## 2019-08-11 DIAGNOSIS — Z951 Presence of aortocoronary bypass graft: Secondary | ICD-10-CM | POA: Diagnosis not present

## 2019-08-11 DIAGNOSIS — R001 Bradycardia, unspecified: Secondary | ICD-10-CM | POA: Diagnosis not present

## 2019-08-11 DIAGNOSIS — I4510 Unspecified right bundle-branch block: Secondary | ICD-10-CM | POA: Insufficient documentation

## 2019-08-11 HISTORY — DX: Unspecified osteoarthritis, unspecified site: M19.90

## 2019-08-11 HISTORY — DX: Dyspnea, unspecified: R06.00

## 2019-08-11 HISTORY — DX: Bronchitis, not specified as acute or chronic: J40

## 2019-08-11 LAB — CBC
HCT: 44.9 % (ref 39.0–52.0)
Hemoglobin: 15.1 g/dL (ref 13.0–17.0)
MCH: 31.5 pg (ref 26.0–34.0)
MCHC: 33.6 g/dL (ref 30.0–36.0)
MCV: 93.7 fL (ref 80.0–100.0)
Platelets: 200 10*3/uL (ref 150–400)
RBC: 4.79 MIL/uL (ref 4.22–5.81)
RDW: 12.6 % (ref 11.5–15.5)
WBC: 10.6 10*3/uL — ABNORMAL HIGH (ref 4.0–10.5)
nRBC: 0 % (ref 0.0–0.2)

## 2019-08-11 LAB — BASIC METABOLIC PANEL
Anion gap: 10 (ref 5–15)
BUN: 11 mg/dL (ref 8–23)
CO2: 26 mmol/L (ref 22–32)
Calcium: 9 mg/dL (ref 8.9–10.3)
Chloride: 102 mmol/L (ref 98–111)
Creatinine, Ser: 0.99 mg/dL (ref 0.61–1.24)
GFR calc Af Amer: >60 mL/min (ref >60)
GFR calc non Af Amer: >60 mL/min (ref >60)
Glucose, Bld: 126 mg/dL — ABNORMAL HIGH (ref 70–99)
Potassium: 3.6 mmol/L (ref 3.5–5.1)
Sodium: 138 mmol/L (ref 135–145)

## 2019-08-11 LAB — TYPE AND SCREEN
ABO/RH(D): O POS
Antibody Screen: NEGATIVE

## 2019-08-11 LAB — SURGICAL PCR SCREEN
MRSA, PCR: NEGATIVE
Staphylococcus aureus: NEGATIVE

## 2019-08-11 NOTE — Patient Instructions (Signed)
Your procedure is scheduled on: 08-18-19 [facility] Report to Same Day Surgery 2nd floor medical mall [facility] Entrance-take elevator on left to 2nd floor.  Check in with surgery information desk.) To find out your arrival time please call [phone] between 1PM - 3PM on 08-17-19 TUESDAY  Remember: Instructions that are not followed completely may result in serious medical risk, up to and including death, or upon the discretion of your surgeon and anesthesiologist your surgery may need to be rescheduled.    _x___ 1. Do not eat food after midnight the night before your procedure. NO GUM OR CANDY AFTER MIDNIGHT. You may drink WATER up to 2 hours before you are scheduled to arrive at the hospital for your procedure.  Do not drink WATER within 2 hours of your scheduled arrival to the hospital.  Type 1 and type 2 diabetics should only drink water.   ____Ensure clear carbohydrate drink on the way to the hospital for bariatric patients  ____Ensure clear carbohydrate drink 3 hours before surgery.    __x__ 2. No Alcohol for 24 hours before or after surgery.   __x__3. No Smoking or e-cigarettes for 24 prior to surgery.  Do not use any chewable tobacco products for at least 6 hour prior to surgery   ____  4. Bring all medications with you on the day of surgery if instructed.    __x__ 5. Notify your doctor if there is any change in your medical condition     (cold, fever, infections).    x___6. On the morning of surgery brush your teeth with toothpaste and water.  You may rinse your mouth with mouth wash if you wish.  Do not swallow any toothpaste or mouthwash.   Do not wear jewelry, make-up, hairpins, clips or nail polish.  Do not wear lotions, powders, or perfumes. You may wear deodorant.  Do not shave 48 hours prior to surgery. Men may shave face and neck.  Do not bring valuables to the hospital.    [facility] is not responsible for any belongings or valuables.               Contacts,  dentures or bridgework may not be worn into surgery.  Leave your suitcase in the car. After surgery it may be brought to your room.  For patients admitted to the hospital, discharge time is determined by your treatment team.  _  Patients discharged the day of surgery will not be allowed to drive home.  You will need someone to drive you home and stay with you the night of your procedure.    Please read over the following fact sheets that you were given:   [facility] Preparing for Surgery   _x___ TAKE THE FOLLOWING MEDICATIONS THE MORNING OF SURGERY WITH A SMALL SIP OF WATER. These include:  1. CELEXA (CITALOPRAM)  2. OMEPRAZOLE (PRILOSEC)  3. TAKE AN EXTRA OMEPRAZOLE THE NIGHT BEFORE YOUR SURGERY  4.  5.  6.  ____Fleets enema or Magnesium Citrate as directed.   _x___ Use CHG Soap or sage wipes as directed on instruction sheet   _X___ Use inhalers on the day of surgery and bring to hospital day of surgery-USE YOUR ALBUTEROL INHALER DAY OF SURGERY AND [place] Maliyah Willets  ____ Stop Metformin and Janumet 2 days prior to surgery.    ____ Take 1/2 of usual insulin dose the night before surgery and none on the morning surgery.   _x___ Follow recommendations from Cardiologist, Pulmonologist or  PCP regarding stopping Aspirin, Coumadin, Plavix ,Eliquis, Effient, or Pradaxa, and Pletal-ASK DR Izora Ribas ABOUT STOPPING YOUR ASPIRIN  X____Stop Anti-inflammatories such as Advil, Aleve, Ibuprofen, Motrin, Naproxen, Naprosyn, Goodies powders or aspirin products NOW- OK to take Tylenol    _x___ Stop supplements until after surgery-STOP FISH OIL NOW-MAY RESUME AFTER SURGERY   ____ Bring C-Pap to the hospital.

## 2019-08-11 NOTE — Pre-Procedure Instructions (Signed)
Progress Notes - documented in this encounter Shane Dibble, MD - 10/14/2016 3:45 PM EST Formatting of this note might be different from the original. Established Patient Visit   Chief Complaint: Chief Complaint  Patient presents with  . Follow-up  6 mo  . Limb Pain  Date of Service: 10/14/2016 Date of Birth: 1956-01-28 PCP: Heath Gold, PA  History of Present Illness: [name] is a 63 y.o.male patient  Coronary Artery Disease The patient has coronary artery disease previously diagnosed by imaging study in the remote past currently on appropriate medication management including ace inhibitor, aspirin and statin for risk factor reduction. Current risk factors for progression include age, male, HTN, Hyperlipidemia, history of heart disease and sedentary life style. The patient appears not to have any significant side effects of treatment at this time and appears stable at this time without evidence of clinical progression of the disease process  Tobacco use The patient was counseled on the risks of tobacco use and significant hazards. They have been informed of the continued cardiovascular risk of stroke, heart attack, and peripheral vascular disease. We have continued to stress the need for discontinuation and/or abstinence if able.  Essential hypertension The patient currently has a diagnosis of essential hypertension with no evidence of secondary causes of high blood pressure at this time. The patient has been on appropriate medication management without current evidence of significant side effects as well as risk reduction for cardiovascular disease complication which appears stable today. We have discussed treatment goals and current guidelines for hypertension therapy. The patient understands risks and benefits of medication management as well as risk factor modification. They agree to continuation of this current medical regimen. Hyperlipidemia The patient does have  hyperlipidemia on Moderate intensity therapy with pravastatin (Pravachol). The lipid levels appear to be stable at this time without apparent significant side effects of medications. There patient currently has knowledge of the continued goals of treatment to reduce cardiovascular risk and/or complication. Reasons for risk reduction include coronary artery disease, > 7.5% 10 year cardiovascular risk score and high LDL  Preoperative Assessment Profile for shoulder surgery Risk factors for cardiac complication with surgery: Positive for: Coronary artery disease and peripheral vascular disease Negative for: Diabetes, Cardiomyopathy or chf and Chronic Kidney disease Active cardiovascular conditions: Positive [id number] Negative for: Angina or anginal equivalent, Recent infartion, Congestive heart failure symptoms, Dysrhythmia and Symptomatic valve disease Functional capacity >4 Mets Risk of surgery and/or procedure low risk Possible need and adjustments in therapy prior to surgery no Overall risk of cardiac complication with surgery and/or procedure is low, <1%  Past Medical and Surgical History  Past Medical History Past Medical History:  Diagnosis Date  . CAD (coronary artery disease)  status post coronary artery bypass graft in 2006  . Depression, unspecified  . Diabetes mellitus (CMS-HCC)  diet controlled.  [name] GERD (gastroesophageal reflux disease)  . Hyperlipidemia, unspecified  . Hypertension  . Insomnia  . Peripheral vascular disease (CMS-HCC)  . Stomach ulcer  . Tobacco use   Past Surgical History He has a past surgical history that includes Coronary artery bypass graft (2006) and Colectomy Partial W/Anastamosis (2004).   Medications and Allergies  Current Medications  Current Outpatient Prescriptions  Medication Sig Dispense Refill  . amitriptyline (ELAVIL) 50 MG tablet  . aspirin 325 MG EC tablet Take 325 mg by mouth once daily.  . citalopram (CELEXA) 40 MG tablet  .  fluticasone (FLOVENT DISKUS) 50 mcg/actuation diskus inhaler Inhale 1 inhalation into  the lungs 2 (two) times daily.  . lansoprazole (PREVACID) 15 MG DR capsule Take 15 mg by mouth once daily.  [name] lisinopril (PRINIVIL,ZESTRIL) 10 MG tablet Take 10 mg by mouth once daily.  . nitroGLYcerin (NITROSTAT) 0.4 MG SL tablet  . omega-3 fatty acids/fish oil 340-1,000 mg capsule Take 1 capsule by mouth once daily.  . potassium chloride (K-DUR,KLOR-CON) 20 MEQ ER tablet Take 20 mEq by mouth once daily.  . pravastatin (PRAVACHOL) 20 MG tablet Take 1 tablet (20 mg total) by mouth nightly. 30 tablet 11   No current facility-administered medications for this visit.   Allergies: Atorvastatin and Penicillin v potassium  Social and Family History  Social History reports that he has been smoking Cigarettes. He has a 41.00 pack-year smoking history. He does not have any smokeless tobacco history on file. He reports that he drinks alcohol. He reports that he does not use drugs.  Family History Family History  Problem Relation Age of Onset  . Lung cancer Mother  . Cirrhosis Father   Review of Systems   Review of Systems  Positive for shoulder pain Negative for weight gain weight loss, weakness, vision change, hearing loss, cough, congestion, PND, orthopnea, heartburn, nausea, diaphoresis, vomiting, diarrhea, bloody stool, melena, stomach pain, leg weakness, leg cramping, leg blood clots, headache, blackouts, nosebleed, trouble swallowing, mouth pain, urinary frequency, urination at night, muscle weakness, skin lesions, skin rashes, tingling ,ulcers, numbness, anxiety, and/or depression Physical Examination   Vitals:BP (P) 158/68  Pulse (P) 68  Resp (P) 14  Ht (P) 182.9 cm (6')  Wt (P) 88.9 kg (196 lb)  BMI (P) 26.58 kg/m  Ht:(P) 182.9 cm (6') Wt:(P) 88.9 kg (196 lb) [id number] surface area is 2.13 meters squared (pended). Body mass index is 26.58 kg/m (pended). Appearance: well appearing in no acute  distress HEENT: Pupils equally reactive to light and accomodation, no xanthalasma  Neck: Supple, no apparent thyromegaly, masses, or lymphadenopathy  Lungs: normal respiratory effort; no crackles, no rhonchi, no wheezes Heart: Regular rate and rhythm. Normal S1 S2 No gallops, murmur, no rub, PMI is normal size and placement. carotid upstroke normal Jugular venous pressure is normal Abdomen: soft, nontender, not distended with normal bowel sounds. No apparent hepatosplenomegally. Abdominal aorta is normal size without bruit Extremities: no edema, no clubbing, no cyanosis Peripheral Pulses: 2+ in upper extremities, 2+ femoral pulses bilaterally, 2+lower extremity  Musculoskeletal; Normal muscle tone without kyphosis Neurological: Oriented and Alert, Cranial nerves intact  Assessment   63 y.o. male with  Encounter Diagnoses  Name Primary?  . Hyperlipidemia, mixed Yes  . Coronary artery disease involving coronary bypass graft of native heart without angina pectoris  . Benign essential hypertension  . Tobacco use   Plan  -Proceed to surgery and/or invasive procedure without restriction to pre or post operative and/or procedural care. The patient is at lowest risk possible for cardiovascular complications with surgical intervention and/or invasive procedure. Currently has no evidence active and/or significant angina and/or congestive heart failure. The patient may discontinue aspirin 7 days prior to procedure and restart at a safe period thereafter -The patient will continue following closely for symptoms of cardiovascular disease including shortness of breath, chest discomfort, giving out, having palpitations, and/or significant progression in weakness and/or fatigue. They understand that these symptoms could be a clue to coronary artery disease progression and to report these symptoms as soon as possible. The patient understands to continue current medical regimen for further risk reduction and  progression of symptoms and/or  cardiovascular disease.  -Hypertension medication management listed above has been reviewed and discussed with the patient. We will continue current medical regimen at this time for reduction of risk of cardiovascular disease. The patient will report any new or future change of blood pressure for need in potential treatment changes. -we have discussed risk reduction in cardiovascular disease process by a continuation of lipid management with current medication management for lipid reduction. The goals continue to be 30-50% lowering of LDL cholesterol in addition of lifestyle measures. The patient has an understanding of this discussion at this time and we will continue the appropriate strategy. -We have counseled the patient on significant hazards of tobacco use. We have suggested discontinuation of tobacco use. We have discussed possible methods of discontinuation of tobacco use including medications, patches, and gum.  No orders of the defined types were placed in this encounter.  Return in about 6 months (around 04/13/2017).  Shane Dibble, MD    Electronically signed by Shane Dibble, MD at 10/14/2016 4:05 PM EST   Plan of Treatment - documented as of this encounter Upcoming Encounters Upcoming Encounters  Date Type Specialty Care Team Description  02/12/2018 Post Op Neurosurgery Ricard Dillon, [place], [place] [address]  [phone]  [phone] ([address])    Marin Olp [place], [place]  [place], [place] [address]  [phone]  [phone] (Fax)    02/26/2018 Post Op Neurosurgery Ricard Dillon, [place] and [place], [place] [address]  [phone]  [phone] (Fax)     Visit Diagnoses - documented in this encounter Diagnosis  Hyperlipidemia, mixed - Primary  Mixed hyperlipidemia   Coronary  artery disease involving coronary bypass graft of native heart without angina pectoris   Benign essential hypertension  Essential hypertension, benign   Tobacco use    Discontinued Medications - documented as of this encounter Medication Sig Discontinue Reason Start Date End Date  amitriptyline (ELAVIL) 25 MG tablet  Take 25 mg by mouth once daily.   10/14/2016  Images  Patient Contacts  Contact Name Contact Address Communication Relationship to Patient  Wexler,Rebecca Unknown [phone] [facility]) Spouse, Emergency Contact  Document Information  Primary Care Provider Other Service Providers Document Coverage Dates  JARIOUS BETANZOS, [place] (Jul. 02, 2015July 02, 2015 - Present) DM: [phone] (Work) [phone] (Fax) [address] [place], [place] [address] Family Medicine  Nov. 20, 2017November 20, 2017   [place] [address] [place], [place] [address]   Encounter Providers Encounter Date  Shane Dibble, MD (Attending) [phone] (Work) [phone] (Fax) [place] [facility] [place], [place] [address] Cardiovascular Disease Nov. 20, 2017November 20, 2017

## 2019-08-12 ENCOUNTER — Encounter: Payer: Self-pay | Admitting: Anesthesiology

## 2019-08-12 ENCOUNTER — Other Ambulatory Visit: Payer: Self-pay | Admitting: Neurosurgery

## 2019-08-12 NOTE — Pre-Procedure Instructions (Signed)
PT WITH H/O CABG AND HAS NOT SEEN CARDIOLOGIST SINCE 2017.  EKG SHOWS NEW INCOMPLETE RBBB AND HR 46. DR Rosey Bath WANTS CARDIAC CLEARANCE-CALLED KENDALYN AT DR Newman [facility] OFFICE AND NOTIFIED HER OF THIS.  CLEARANCE REQUEST ALONG WITH EKG FAXED TO DR Izora Ribas AND DR Fabiola Backer OFFICE. KENDALYN IS GOING TO SEND MESSAGE TO DR Fabiola Backer OFFICE REGARDING NEED FOR CLEARANCE

## 2019-08-13 ENCOUNTER — Other Ambulatory Visit: Admission: RE | Admit: 2019-08-13 | Payer: Medicare Other | Source: Ambulatory Visit

## 2019-08-13 ENCOUNTER — Inpatient Hospital Stay: Admission: RE | Admit: 2019-08-13 | Payer: Medicare Other | Source: Ambulatory Visit

## 2019-08-23 NOTE — Pre-Procedure Instructions (Signed)
Progress Notes - documented in this encounter Shane Dibble, MD - 08/23/2019 11:00 AM EDT Formatting of this note might be different from the original. New Patient Visit   Chief Complaint: Chief Complaint  Patient presents with  . Hypertension  . Coronary Artery Disease  Date of Service: 08/23/2019 Date of Birth: 05/14/1956 PCP: Shane Gold, PA  History of Present Illness: [name] is a 63 y.o.male patient Shortness of breath The patient presents with acute on chronic shortness of breath worsening with increased severity and frequency over the last 1 months which occurs with mild exertion and limits ADLs associated with walking fast and relived by rest and lasting intermittent (1-10 minutes). Other related symptoms include fatigue and back pain. The differential diagnosis includes hypertension, valve disease, anginal equivalent and decrease exercise tolerance  Coronary artery bypass graft The patient has coronary artery disease status post coronary artery bypass graft with concerning progression of signs and symptoms of instability. These signs and symptoms suggest the potential for anginal equivalent and or graft stenosis. We have discussed the need for further evaluation and appropriate treatment options. The patient understands this includes the potential for invasive and noninvasive interventions and medication management changes. All risks and benefits of these interventions have been discussed. Essential Hypertension The patient has been on medication management listed below for essential hypertension. Reported average blood pressure readings recently have shown that the blood pressure is stable. The patient has not had any side effects of these medications at this time. We have discussed treatment goals with the patient for which they understand and agree. This includes medication management, lifestyle changes, and diet management. Currently there is no apparent secondary  causes of the hypertension at this time. Hyperlipidemia The patient does have hyperlipidemia on Moderate intensity therapy with pravastatin (Pravachol). The lipid levels appear to be stable at this time without apparent significant side effects of medications. There patient currently has knowledge of the continued goals of treatment to reduce cardiovascular risk and/or complication. Reasons for risk reduction and further prevention measures include coronary artery disease, vascular disease, > 7.5% 10 year cardiovascular risk score and high LDL  Abnormal ECG The patient has an EKG that has been reported to be abnormal. The time period of this abnormality has been recent. The patient additionally has had symptoms including shortness of breath and chest pain or anginal equivalent NSR and RBBB   Past Medical and Surgical History  Past Medical History Past Medical History:  Diagnosis Date  . CAD (coronary artery disease)  status post coronary artery bypass graft in 2006  . Depression  . Diabetes mellitus (CMS-HCC)  diet controlled.  [name] GERD (gastroesophageal reflux disease)  . Hyperlipidemia  . Hypertension  . Insomnia  . Peripheral vascular disease (CMS-HCC)  . Stomach ulcer  . Tobacco use   Past Surgical History He has a past surgical history that includes Coronary artery bypass graft (2006); Colectomy Partial W/Anastamosis (2004); Anterior fusion cervical spine (1990s); right shoulder surgery x 2; and C3-5 ACDF (01/28/2018).   Medications and Allergies  Current Medications  Current Outpatient Medications  Medication Sig Dispense Refill  . albuterol 90 mcg/actuation inhaler 2 inhalations every 6 (six) hours as needed  . amitriptyline (ELAVIL) 50 MG tablet  . aspirin 325 MG EC tablet Take 325 mg by mouth once daily.  . cholecalciferol, vitamin D3, (VITAMIN D3 ORAL) Take by mouth once daily  . citalopram (CELEXA) 40 MG tablet  . lansoprazole (PREVACID) 15 MG DR capsule Take 15  mg by  mouth once daily.  [name] lisinopril (PRINIVIL,ZESTRIL) 10 MG tablet Take 10 mg by mouth once daily.  . nitroGLYcerin (NITROSTAT) 0.4 MG SL tablet  . omega-3 fatty acids/fish oil 340-1,000 mg capsule Take 1 capsule by mouth as needed  . pravastatin (PRAVACHOL) 40 MG tablet Take 1 tablet by mouth nightly  . tiZANidine (ZANAFLEX) 4 MG tablet Take 1 tablet by mouth nightly as needed 2   No current facility-administered medications for this visit.   Allergies: Atorvastatin and Penicillin v potassium  Social and Family History  Social History reports that he quit smoking about 16 months ago. His smoking use included cigarettes. He has a 10.25 pack-year smoking history. He has never used smokeless tobacco. He reports previous alcohol use. He reports that he does not use drugs.  Family History Family History  Problem Relation Age of Onset  . Lung cancer Mother  . Cirrhosis Father   Review of Systems  Positive for sob back pain Review of Systems:negative for weight gain, weight loss, weakness, fatigue, vision change, hearing loss, cough, congestion, PND, orthopnea, heartburn, nausea, vomiting, diarrhea, bloody stools, melena, stomach pain, leg weakness, leg pain, leg blood clots leg cramping, headache, blackouts, nosebleed, trouble swallowing, frequent urination, urination at night, skin rashes, skin lesions, muscle weakness, numbness, tingling, anxiety, depression  Physical Examination   Vitals:BP 120/80 (BP Location: Left upper arm, Patient Position: Sitting, BP Cuff Size: Adult)  Pulse 62  Resp 16  Ht 180.3 cm (5\' 11" )  Wt 81.5 kg (179 lb 9.6 oz)  SpO2 96%  BMI 25.05 kg/m  Ht:180.3 cm (5\' 11" ) Wt:81.5 kg (179 lb 9.6 oz) [id number] surface area is 2.02 meters squared. Body mass index is 25.05 kg/m. Appearance: well appearing in no acute distress HEENT: Pupils equally reactive to light and accomodation no apparent xantholasma or other apparent lesions  Neck: Supple without masses or  lymphadenopathy Lungs: normal respiratory effort; no wheezes, no crackles, no rhonchi Heart: Regular rate and rhythm. Normal S1 S2 No gallops, murmurs, no rub, PMI is normal size and placement. carotid upstroke normal without bruit. Jugular venous pressure is normal Abdomen: soft, nontender, non distended, with normal bowel sounds. Abdominal aorta is normal size without bruit. No apparent masses Extremities: No edema, no cyanosis, no clubbing, no ulcers Peripheral Pulses: 2+ in all extremities, 2+ femoral pulses bilaterally, 2+dp pulses Musculoskeletal; Normal muscle tone without kyphosis  Neurological: Cranial nerves intact, Oriented to time, place, and person  Assessment   63 y.o. male with  Encounter Diagnoses  Name Primary?  . Coronary artery disease involving coronary bypass graft of native heart without angina pectoris Yes  . Benign essential hypertension  . Hyperlipidemia, mixed  . Shortness of breath   Plan  -Echocardiogram for further evaluation of dyspnea with cardiomyopathy and coronary artery disease  -Persantine SESTAMIBI for shortness of breath and angina  -There has been a discussion of the current guidelines for hypertension control. We will continue current medical regimen for hypertension control which will also help in risk factor modification of cardiovascular disease. The patient understands and agrees with the current plan. We will be watching for possible future side effects of these medications. Additional home blood pressure monitoring is recommended if able. -We have discussed risk reduction in the cardiovascular disease process by a continuation of lipid management with the current medication management for lipid reduction. The goals continue to be 30-50% lowering of LDL cholesterol in addition to lifestyle measures. This will include diet and improved activity level  on a regular basis.The patient has an understanding of this discussion at this time and we will  continue the appropriate strategy.  Orders Placed This Encounter  Procedures  . NM myocardial perfusion SPECT multiple (stress and rest)  . ECG 12-lead  . ECG stress test only  . Echo complete   No follow-ups on file.  Shane Dibble, MD  Electronically signed by Shane Dibble, MD at 08/23/2019 11:25 AM EDT   Plan of Treatment - documented as of this encounter Upcoming Encounters Upcoming Encounters  Date Type Specialty Care Team Description  08/27/2019 Ancillary Procedure Cardiology Shane Dibble, MD  [address]  [facility]  [place], [place] [address]  [phone]  [phone] ([phone]    09/16/2019 Post Op Neurosurgery Ricard Dillon, [place].  [place], [place] [address]  [phone]  [phone] ([address]    Marin Olp [place], [place]  [place], [place] [address]  [phone]  [phone] (Fax)    09/30/2019 [place], [place], [place] and [place], [place] [address]  [phone]  [phone] (Fax)     Pending Results Pending Results  Name Type Priority Associated Diagnoses Date/Time  ECG 12-lead ECG Routine Coronary artery disease involving coronary bypass graft of native heart without angina pectoris  Benign essential hypertension  08/23/2019 11:06 AM EDT   Scheduled Orders Scheduled Orders  Name Type Priority Associated Diagnoses Order Schedule  Echo complete Echocardiography Routine Coronary artery disease involving coronary bypass graft of native heart without angina pectoris  Shortness of breath  1 Occurrences starting 08/23/2019 until 08/23/2020  NM myocardial perfusion SPECT multiple (stress and rest) Imaging Routine Coronary artery disease involving coronary bypass graft of native heart without angina pectoris  Shortness of breath  1 Occurrences  starting 08/23/2019 until 08/23/2020  ECG stress test only ECG Routine Coronary artery disease involving coronary bypass graft of native heart without angina pectoris  Shortness of breath  1 Occurrences starting 08/23/2019 until 08/23/2020   Procedures - documented in this encounter Procedure Name Priority Date/Time Associated Diagnosis Comments  ECG 12-LEAD Routine 08/23/2019 11:06 AM EDT Coronary artery disease involving coronary bypass graft of native heart without angina pectoris  Benign essential hypertension     Visit Diagnoses - documented in this encounter Diagnosis  Coronary artery disease involving coronary bypass graft of native heart without angina pectoris - Primary   Benign essential hypertension  Essential hypertension, benign   Hyperlipidemia, mixed  Mixed hyperlipidemia   Shortness of breath    Discontinued Medications - documented as of this encounter Medication Sig Discontinue Reason Start Date End Date  fluticasone (FLOVENT DISKUS) 50 mcg/actuation diskus inhaler  Inhale 1 inhalation into the lungs 2 (two) times daily.   08/23/2019  CHANTIX STARTING MONTH BOX tablet    02/14/2017 08/23/2019  Images  Patient Contacts  Contact Name Contact Address Communication Relationship to Patient  [name] Unknown [phone] [facility]) Spouse, Emergency Contact  Document Information  Primary Care Provider Other Service Providers Document Coverage Dates  HEDLEY STUDENT, [place] (Jul. 02, 2015July 02, 2015 - Present) DM: [phone] (Work) [phone] (Fax) [address] [place], [place] [address] Family Medicine    Sep. 28, 2020September 28, 2020   [place] [address] [place], [place] [address]   Encounter Providers Encounter Date  Shane Dibble, MD (Attending) DM: [phone]  [phone] (Work) [phone] (Fax) [place] [facility]  [place], [place] [address] Cardiovascular Disease Sep. 28, 2020

## 2019-08-25 ENCOUNTER — Other Ambulatory Visit: Payer: Self-pay | Admitting: Internal Medicine

## 2019-08-25 DIAGNOSIS — I25810 Atherosclerosis of coronary artery bypass graft(s) without angina pectoris: Secondary | ICD-10-CM

## 2019-08-25 DIAGNOSIS — R0602 Shortness of breath: Secondary | ICD-10-CM

## 2019-08-30 ENCOUNTER — Encounter
Admission: RE | Admit: 2019-08-30 | Discharge: 2019-08-30 | Disposition: A | Discharge status: Home / Self Care | Payer: Medicare Other | Source: Ambulatory Visit | Attending: Neurosurgery | Admitting: Neurosurgery

## 2019-08-30 ENCOUNTER — Ambulatory Visit
Admission: RE | Admit: 2019-08-30 | Discharge: 2019-08-30 | Disposition: A | Discharge status: Home / Self Care | Payer: Medicare Other | Source: Ambulatory Visit | Attending: Internal Medicine | Admitting: Internal Medicine

## 2019-08-30 ENCOUNTER — Other Ambulatory Visit: Payer: Self-pay

## 2019-08-30 DIAGNOSIS — R0602 Shortness of breath: Secondary | ICD-10-CM

## 2019-08-30 DIAGNOSIS — I25810 Atherosclerosis of coronary artery bypass graft(s) without angina pectoris: Secondary | ICD-10-CM | POA: Insufficient documentation

## 2019-08-30 LAB — NM MYOCAR MULTI W/SPECT W/WALL MOTION / EF
Estimated workload: 1 METS
Exercise duration (min): 1 min
Exercise duration (sec): 6 s
LV dias vol: 117 mL (ref 62–150)
LV sys vol: 43 mL
Peak HR: 109 {beats}/min
Percent HR: 68 %
Rest HR: 47 {beats}/min
SDS: 0
SRS: 7
SSS: 0
TID: 0.91

## 2019-08-30 LAB — URINALYSIS, ROUTINE W REFLEX MICROSCOPIC
Bilirubin Urine: NEGATIVE
Glucose, UA: NEGATIVE mg/dL
Hgb urine dipstick: NEGATIVE
Ketones, ur: NEGATIVE mg/dL
Leukocytes,Ua: NEGATIVE
Nitrite: NEGATIVE
Protein, ur: NEGATIVE mg/dL
Specific Gravity, Urine: 1.018 (ref 1.005–1.030)
pH: 6 (ref 5.0–8.0)

## 2019-08-30 LAB — APTT: aPTT: 34 seconds (ref 24–36)

## 2019-08-30 LAB — PROTIME-INR
INR: 1 (ref 0.8–1.2)
Prothrombin Time: 13.2 seconds (ref 11.4–15.2)

## 2019-08-30 LAB — SARS CORONAVIRUS 2 (TAT 6-24 HRS): SARS Coronavirus 2: NEGATIVE

## 2019-08-30 MED ORDER — TECHNETIUM TC 99M TETROFOSMIN IV KIT
31.6500 | PACK | Freq: Once | INTRAVENOUS | Status: AC | PRN
  Administered 2019-08-30: 31.65 via INTRAVENOUS

## 2019-08-30 MED ORDER — TECHNETIUM TC 99M TETROFOSMIN IV KIT
10.9660 | PACK | Freq: Once | INTRAVENOUS | Status: AC | PRN
  Administered 2019-08-30: 09:00:00 10.966 via INTRAVENOUS

## 2019-08-30 MED ORDER — REGADENOSON 0.4 MG/5ML IV SOLN
0.4000 mg | Freq: Once | INTRAVENOUS | Status: AC
  Administered 2019-08-30: 0.4 mg via INTRAVENOUS

## 2019-08-31 MED ORDER — CIPROFLOXACIN IN D5W 400 MG/200ML IV SOLN
400.0000 mg | Freq: Two times a day (BID) | INTRAVENOUS | Status: DC
  Administered 2019-09-01: 14:00:00 400 mg via INTRAVENOUS

## 2019-08-31 NOTE — Pre-Procedure Instructions (Signed)
Progress Notes - documented in this encounter Shane Dibble, MD - 08/31/2019 9:30 AM EDT Formatting of this note might be different from the original. Established Patient Visit   Chief Complaint: Chief Complaint  Patient presents with  . Peripheral Vascular Disease  . Hypertension  Date of Service: 08/31/2019 Date of Birth: 03-15-1956 PCP: Heath Gold, PA  History of Present Illness: [name] is a 63 y.o.male patient  Coronary artery bypass grafting The patient has had coronary artery bypass grafting years ago due to risk factors including age, male, HTN, Hyperlipidemia and tobacco abuse on medications including ace, statin and asa for further risk reduction in restenosis. The patient has had reasonable exercise tolerance without significant cardiovascular symptoms and appears currently stable at this time. Stress Test Persantine SESTAMIBI was performed showing Normal test.  Echocardiogram The patient has had an echocardiogram showing Segmental LV dysfunction LVEF 45% Mild MR Mild TR without pulmonary hypertension Mixed Hyperlipidemia The patient has cardiovascular risk factors including Vascular disease, Coronary artery disease, High LDL cholesterol and greater than 7.5% 10 year cardiovascular risk score and therefore has been placed on Moderate intensity therapy with pravastatin (Pravachol) for reduction in LDL and cardiovascular risk for which we have discussed today. Other healthy lifestyle measures have been discussed as well. We have discussed the appropriate treatment goals of the above measures which include a 30% to 50% reduction in LDL levels. A significant component of residual cardiovascular risk is in adequate LDL C lowering in the large majority of patients. Additionally ultralow LDL-C is safe with no apparent downside. The patient has a clear understanding of reasons for lipid management at this time. The patient is currently having no evidence of significant side  effects of this medication at this time. Essential hypertension The patient currently has a diagnosis of essential hypertension with no evidence of secondary causes of high blood pressure at this time. The patient has been on appropriate medication management without current evidence of significant side effects. This regimen is for risk reduction of cardiovascular disease and possible future complication. The blood pressure appears stable today. We have discussed treatment goals and current guidelines for hypertension therapy. The patient understands risks and benefits of medication management and risk factor modification. They agree to continuation of this current medical regimen. Preoperative Assessment Profile Risk factors for cardiac complication with back surgery: Positive for: Coronary artery disease and peripheral vascular disease Negative for: Diabetes, Cardiomyopathy or chf and Chronic Kidney disease Active cardiovascular conditions: Positive [id number] Negative for: Angina or anginal equivalent, Recent infartion, Congestive heart failure symptoms, Dysrhythmia and Symptomatic valve disease Functional capacity <4 Mets Risk of surgery and/or procedure low Possible need and adjustments in therapy prior to surgery and/or procedure no Overall risk of cardiac complication with surgery and/or procedure is low, <1%  Results for orders placed or performed in visit on 08/27/19  Echo complete  Result Value Ref Range  LV Ejection Fraction (%) 45  Aortic Valve Stenosis Grade none  Aortic Valve Regurgitation Grade none  Aortic Valve Max Velocity (m/s) 1.4 m/sec  Mitral Valve Stenosis Grade none  Mitral Valve Regurgitation Grade mild  Tricuspid Valve Regurgitation Grade mild  Tricuspid Valve Regurgitation Max Velocity (m/s) 2.2 m/sec  Right Ventricle Systolic Pressure (mmHg) [id number] mmHg  LV End Diastolic Diameter (cm) 5.3 cm  LV End Systolic Diameter (cm) 4.1 cm  LV Septum Wall Thickness (cm)  1.1 cm  LV Posterior Wall Thickness (cm) 0.97 cm  Left Atrium Diameter (cm) 4 cm  Narrative  CARDIOLOGY DEPARTMENT LIZZIE, DAGAN [facility]IC [id number] [name] #: [id number] [address] [name] [place], [place] [address] Date: 08/27/2019 10: 00 AM Adult Male Age: 84 yrs ECHOCARDIOGRAM REPORT [facility] STUDY:CHEST WALL TAPE: MD1: KOWALSKI, BRUCE JAY ECHO:Yes DOPPLER:Yes FILE: BP: 120/80 mmHg COLOR:Yes CONTRAST:No MACHINE:Philips RV BIOPSY:No 3D:No SOUND QLTY:Moderate Height: 71 in MEDIUM:None Weight: 179 lb BSA: 2.0 m2 _________________________________________________________________________________________ HISTORY: DOE REASON: Assess, LV function INDICATION: SOBOE (shortness of breath on exertion) [R06.02 (ICD-10-CM)] _________________________________________________________________________________________ ECHOCARDIOGRAPHIC MEASUREMENTS 2D DIMENSIONS AORTA Values Normal Range MAIN PA Values Normal Range Annulus: 1.8 cm [2.3-2.9] PA Main: nm* [1.5-2.1] Aorta Sin: 3.0 cm [3.1-3.7] RIGHT VENTRICLE ST Junction: nm* [2.6-3.2] RV Base: nm* [<4.2] Asc.Aorta: nm* [2.6-3.4] RV Mid: 2.7 cm [<3.5] LEFT VENTRICLE RV Length: nm* [<8.6] LVIDd: 5.3 cm [4.2-5.9] INFERIOR VENA CAVA LVIDs: 4.1 cm Max. IVC: nm* [<=2.1] FS: 22.6 % [>25] Min. IVC: nm* SWT: 1.1 cm [0.6-1.0] ------------------ PWT: 0.97 cm [0.6-1.0] nm* - not measured LEFT ATRIUM LA Diam: 4.0 cm [3.0-4.0] LA A4C Area: nm* [<20] LA Volume: nm* [18-58] _________________________________________________________________________________________ ECHOCARDIOGRAPHIC DESCRIPTIONS AORTIC ROOT Size: Normal Dissection: INDETERM FOR DISSECTION AORTIC VALVE Leaflets: Tricuspid Morphology: Normal Mobility: Fully mobile LEFT VENTRICLE Size: Normal Anterior: Normal Contraction: REGIONALLY IMPAIRED Lateral: Normal Closest EF: 45% (Estimated) Septal: HYPOCONTRACTILE LV Masses: No Masses Apical: Normal LVH:  None Inferior: HYPOCONTRACTILE Posterior: Normal Dias.FxClass: N/A MITRAL VALVE Leaflets: Normal Mobility: Fully mobile Morphology: Normal LEFT ATRIUM Size: Normal LA Masses: No masses IA Septum: Normal IAS MAIN PA Size: Normal PULMONIC VALVE Morphology: Normal Mobility: Fully mobile RIGHT VENTRICLE RV Masses: No Masses Size: Normal Free Wall: Normal Contraction: Normal TRICUSPID VALVE Leaflets: Normal Mobility: Fully mobile Morphology: Normal RIGHT ATRIUM Size: Normal RA Other: None RA Mass: No masses PERICARDIUM Fluid: No effusion INFERIOR VENACAVA Size: Normal Normal respiratory collapse _________________________________________________________________________________________  DOPPLER ECHO and OTHER SPECIAL PROCEDURES Aortic: No AR No AS 135.3 cm/sec peak vel 7.3 mmHg peak grad Mitral: MILD MR No MS 1.7 cm^2 by DOPPLER MV Inflow E Vel = 83.6 cm/sec MV Annulus E'Vel = 6.4 cm/sec E/E'Ratio = 13.1 Tricuspid: MILD TR No TS 222.2 cm/sec peak TR vel 22.7 mmHg peak RV pressure Pulmonary: TRIVIAL PR No PS 111.3 cm/sec peak vel _________________________________________________________________________________________ INTERPRETATION MILD LV SYSTOLIC DYSFUNCTION (See above) NORMAL RIGHT VENTRICULAR SYSTOLIC FUNCTION MILD VALVULAR REGURGITATION (See above) NO VALVULAR STENOSIS MILD MR, TR TRIVIAL PR EF 45% _________________________________________________________________________________________ Electronically signed by MD Serafina Royals on 08/30/2019 12: 55 PM Performed By: Maurilio Lovely, RDCS Ordering Physician: Serafina Royals _________________________________________________________________________________________   Past Medical and Surgical History  Past Medical History Past Medical History:  Diagnosis Date  . CAD (coronary artery disease)  status post coronary artery bypass graft in 2006  . Depression  . Diabetes mellitus (CMS-HCC)  diet controlled.  [name]  GERD (gastroesophageal reflux disease)  . Hyperlipidemia  . Hypertension  . Insomnia  . Peripheral vascular disease (CMS-HCC)  . Stomach ulcer  . Tobacco use   Past Surgical History He has a past surgical history that includes Coronary artery bypass graft (2006); Colectomy Partial W/Anastamosis (2004); Anterior fusion cervical spine (1990s); right shoulder surgery x 2; and C3-5 ACDF (01/28/2018).   Medications and Allergies  Current Medications  Current Outpatient Medications on File Prior to Visit  Medication Sig Dispense Refill  . albuterol 90 mcg/actuation inhaler 2 inhalations every 6 (six) hours as needed  . amitriptyline (ELAVIL) 50 MG tablet  . aspirin 325 MG EC tablet Take 325 mg by mouth once daily.  [name]  cholecalciferol, vitamin D3, (VITAMIN D3 ORAL) Take by mouth once daily  . citalopram (CELEXA) 40 MG tablet  . lansoprazole (PREVACID) 15 MG DR capsule Take 15 mg by mouth once daily.  [name] lisinopril (PRINIVIL,ZESTRIL) 10 MG tablet Take 10 mg by mouth once daily.  . methylPREDNISolone (MEDROL DOSEPACK) 4 mg tablet Follow package directions. 21 tablet 0  . nitroGLYcerin (NITROSTAT) 0.4 MG SL tablet  . omega-3 fatty acids/fish oil 340-1,000 mg capsule Take 1 capsule by mouth as needed  . pravastatin (PRAVACHOL) 40 MG tablet Take 1 tablet by mouth nightly  . tiZANidine (ZANAFLEX) 4 MG tablet Take 1 tablet by mouth nightly as needed 2   No current facility-administered medications on file prior to visit.   Allergies: Atorvastatin and Penicillin v potassium  Social and Family History  Social History reports that he quit smoking about 17 months ago. His smoking use included cigarettes. He has a 10.25 pack-year smoking history. He has never used smokeless tobacco. He reports previous alcohol use. He reports that he does not use drugs.  Family History Family History  Problem Relation Age of Onset  . Lung cancer Mother  . Cirrhosis Father   Review of Systems   Review of  Systems  Positive for back pain leg cramping Negative for weight gain weight loss, weakness, vision change, hearing loss, cough, congestion, PND, orthopnea, heartburn, nausea, diaphoresis, vomiting, diarrhea, bloody stool, melena, stomach pain, extremity pain, leg weakness, leg blood clots, headache, blackouts, nosebleed, trouble swallowing, mouth pain, urinary frequency, urination at night, muscle weakness, skin lesions, skin rashes, tingling numbness, anxiety, and/or depression Physical Examination   Vitals:BP (!) 160/90 (BP Location: Left upper arm, Patient Position: Sitting, BP Cuff Size: Adult)  Pulse 57  Resp 16  Ht 180.3 cm (5\' 11" )  Wt 81.1 kg (178 lb 12.8 oz)  SpO2 96%  BMI 24.94 kg/m  Ht:180.3 cm (5\' 11" ) Wt:81.1 kg (178 lb 12.8 oz) [id number] surface area is 2.02 meters squared. Body mass index is 24.94 kg/m. Appearance: well appearing in no acute distress HEENT: Pupils equally reactive to light and accomodationv  Neck: Supple, no apparent thyromegaly, masses, or lymphadenopathy  Lungs: normal respiratory effort; no crackles, no rhonchi, no wheezes Heart: Regular rate and rhythm. Normal S1 S2 No gallops, murmur, no rub, PMI is normal size and placement. carotid upstroke normal without bruit. Jugular venous pressure is normal Abdomen: soft, nontender, not distended with normal bowel sounds. No apparent hepatosplenomegally. Abdominal aorta is normal size without bruit Extremities: no edema, no ulcers, no clubbing, no cyanosis Peripheral Pulses: 2+ in upper extremities, 1+ femoral pulses bilaterally, 1+lower extremity  Musculoskeletal; Normal muscle tone without kyphosis Neurological: Oriented and Alert, Cranial nerves intact  Assessment   63 y.o. male with  Encounter Diagnoses  Name Primary?  . Coronary artery disease involving coronary bypass graft of native heart without angina pectoris Yes  . Hyperlipidemia, mixed  . Benign essential hypertension  . Atherosclerotic  peripheral vascular disease with intermittent claudication (CMS-HCC)   Plan  -Proceed to surgery and/or invasive procedure without restriction to pre or post operative and/or procedural care. The patient is at lowest risk possible for cardiovascular complications with surgical intervention and/or invasive procedure. Currently has no evidence active and/or significant angina and/or congestive heart failure. The patient may discontinue aspirin 4 days prior to procedure and restart at a safe period thereafter -The patient will continue current medical regimen for further risk reduction of coronary artery bypass graft stenoses and/or future complication. The patient has  been instructed on this medication management as well as other risk factor reduction therapy. This includes a diet and exercise program. They will continue to report any new symptoms suggestive of stenosis of coronary artery bypass grafts. -We have discussed risk reduction in the cardiovascular disease process by a continuation of lipid management with the current medication management for lipid reduction. The goals continue to be 30-50% lowering of LDL cholesterol in addition to lifestyle measures. This will include diet and improved activity level on a regular basis.The patient has an understanding of this discussion at this time and we will continue the appropriate strategy. - continue anti-platelet medication management for peripheral vascular disease at this time. Patient understands risk and benefits of this medication management which includes bleeding and possible bruising. Other cardiovascular risk management will also be continued including blood pressure control, lipid treatment, and lifestyle changes. -Continue current medical regimen for hypertension control which is stable at this time and without apparent significant side effects or symptoms of medications. Further treatment goals of low sodium diet for additive effects of these  medications have been discussed today as well.  No orders of the defined types were placed in this encounter.  Return in about 2 months (around 10/31/2019).  Shane Dibble, MD    Electronically signed by Shane Dibble, MD at 08/31/2019 9:37 AM EDT

## 2019-08-31 NOTE — Pre-Procedure Instructions (Signed)
CARDIAC CLEARANCE IN CARE EVERYWHERE FROM DR Nehemiah Massed

## 2019-09-01 ENCOUNTER — Inpatient Hospital Stay: Payer: Medicare Other | Admitting: Anesthesiology

## 2019-09-01 ENCOUNTER — Other Ambulatory Visit: Payer: Self-pay

## 2019-09-01 ENCOUNTER — Inpatient Hospital Stay: Payer: Medicare Other

## 2019-09-01 ENCOUNTER — Inpatient Hospital Stay
Admission: RE | Admit: 2019-09-01 | Discharge: 2019-09-02 | DRG: 455 | Disposition: A | Discharge status: Home / Self Care | Payer: Medicare Other | Attending: Neurosurgery | Admitting: Neurosurgery

## 2019-09-01 ENCOUNTER — Encounter: Payer: Self-pay | Admitting: *Deleted

## 2019-09-01 ENCOUNTER — Encounter
Admission: RE | Disposition: A | Discharge status: Home / Self Care | Payer: Self-pay | Source: Home / Self Care | Attending: Neurosurgery

## 2019-09-01 DIAGNOSIS — E119 Type 2 diabetes mellitus without complications: Secondary | ICD-10-CM | POA: Diagnosis present

## 2019-09-01 DIAGNOSIS — E785 Hyperlipidemia, unspecified: Secondary | ICD-10-CM | POA: Diagnosis present

## 2019-09-01 DIAGNOSIS — Z951 Presence of aortocoronary bypass graft: Secondary | ICD-10-CM | POA: Diagnosis not present

## 2019-09-01 DIAGNOSIS — Z880 Allergy status to penicillin: Secondary | ICD-10-CM | POA: Diagnosis not present

## 2019-09-01 DIAGNOSIS — Z20828 Contact with and (suspected) exposure to other viral communicable diseases: Secondary | ICD-10-CM | POA: Diagnosis present

## 2019-09-01 DIAGNOSIS — Z87891 Personal history of nicotine dependence: Secondary | ICD-10-CM

## 2019-09-01 DIAGNOSIS — M4317 Spondylolisthesis, lumbosacral region: Secondary | ICD-10-CM | POA: Diagnosis present

## 2019-09-01 DIAGNOSIS — I2510 Atherosclerotic heart disease of native coronary artery without angina pectoris: Secondary | ICD-10-CM | POA: Diagnosis present

## 2019-09-01 DIAGNOSIS — Z79899 Other long term (current) drug therapy: Secondary | ICD-10-CM

## 2019-09-01 DIAGNOSIS — M48062 Spinal stenosis, lumbar region with neurogenic claudication: Secondary | ICD-10-CM | POA: Diagnosis present

## 2019-09-01 DIAGNOSIS — M47817 Spondylosis without myelopathy or radiculopathy, lumbosacral region: Secondary | ICD-10-CM | POA: Diagnosis present

## 2019-09-01 DIAGNOSIS — K219 Gastro-esophageal reflux disease without esophagitis: Secondary | ICD-10-CM | POA: Diagnosis present

## 2019-09-01 DIAGNOSIS — I10 Essential (primary) hypertension: Secondary | ICD-10-CM | POA: Diagnosis present

## 2019-09-01 DIAGNOSIS — Z981 Arthrodesis status: Secondary | ICD-10-CM

## 2019-09-01 DIAGNOSIS — Z419 Encounter for procedure for purposes other than remedying health state, unspecified: Secondary | ICD-10-CM

## 2019-09-01 HISTORY — PX: LUMBAR LAMINECTOMY/DECOMPRESSION MICRODISCECTOMY: SHX5026

## 2019-09-01 HISTORY — PX: TRANSFORAMINAL LUMBAR INTERBODY FUSION W/ MIS 1 LEVEL: SHX6145

## 2019-09-01 LAB — GLUCOSE, CAPILLARY
Glucose-Capillary: 159 mg/dL — ABNORMAL HIGH (ref 70–99)
Glucose-Capillary: 175 mg/dL — ABNORMAL HIGH (ref 70–99)
Glucose-Capillary: 176 mg/dL — ABNORMAL HIGH (ref 70–99)

## 2019-09-01 LAB — TYPE AND SCREEN
ABO/RH(D): O POS
Antibody Screen: NEGATIVE

## 2019-09-01 SURGERY — LUMBAR LAMINECTOMY/DECOMPRESSION MICRODISCECTOMY 3 LEVELS
Anesthesia: General | Site: Back

## 2019-09-01 MED ORDER — POLYETHYLENE GLYCOL 3350 17 G PO PACK: 17.0000 g | PACK | Freq: Every day | ORAL | Status: DC | PRN

## 2019-09-01 MED ORDER — FENTANYL CITRATE (PF) 100 MCG/2ML IJ SOLN
INTRAMUSCULAR | Status: AC
  Filled 2019-09-01: qty 2

## 2019-09-01 MED ORDER — DEXAMETHASONE SODIUM PHOSPHATE 10 MG/ML IJ SOLN
INTRAMUSCULAR | Status: AC
  Filled 2019-09-01: qty 1

## 2019-09-01 MED ORDER — PROPOFOL 10 MG/ML IV BOLUS
INTRAVENOUS | Status: DC | PRN
  Administered 2019-09-01: 150 mg via INTRAVENOUS
  Administered 2019-09-01: 50 mg via INTRAVENOUS

## 2019-09-01 MED ORDER — MENTHOL 3 MG MT LOZG
1.0000 | LOZENGE | OROMUCOSAL | Status: DC | PRN
  Filled 2019-09-01: qty 9

## 2019-09-01 MED ORDER — AMITRIPTYLINE HCL 50 MG PO TABS
50.0000 mg | ORAL_TABLET | Freq: Every day | ORAL | Status: DC
  Filled 2019-09-01 (×2): qty 1

## 2019-09-01 MED ORDER — PHENOL 1.4 % MT LIQD
1.0000 | OROMUCOSAL | Status: DC | PRN
  Filled 2019-09-01: qty 177

## 2019-09-01 MED ORDER — EPHEDRINE SULFATE 50 MG/ML IJ SOLN
INTRAMUSCULAR | Status: AC
  Filled 2019-09-01: qty 1

## 2019-09-01 MED ORDER — THROMBIN 5000 UNITS EX SOLR
CUTANEOUS | Status: AC
  Filled 2019-09-01: qty 5000

## 2019-09-01 MED ORDER — OXYCODONE HCL 5 MG PO TABS
5.0000 mg | ORAL_TABLET | ORAL | Status: DC | PRN
  Administered 2019-09-02: 5 mg via ORAL
  Filled 2019-09-01: qty 1

## 2019-09-01 MED ORDER — MAGNESIUM CITRATE PO SOLN
1.0000 | Freq: Once | ORAL | Status: DC | PRN
  Filled 2019-09-01: qty 296

## 2019-09-01 MED ORDER — DEXAMETHASONE SODIUM PHOSPHATE 10 MG/ML IJ SOLN
INTRAMUSCULAR | Status: DC | PRN
  Administered 2019-09-01: 10 mg via INTRAVENOUS

## 2019-09-01 MED ORDER — HYDROMORPHONE HCL 1 MG/ML IJ SOLN
0.5000 mg | INTRAMUSCULAR | Status: AC | PRN
  Administered 2019-09-01 (×4): 0.5 mg via INTRAVENOUS

## 2019-09-01 MED ORDER — OXYCODONE HCL 5 MG PO TABS
10.0000 mg | ORAL_TABLET | ORAL | Status: DC | PRN
  Administered 2019-09-02: 10 mg via ORAL
  Filled 2019-09-01: qty 2

## 2019-09-01 MED ORDER — HEPARIN SODIUM (PORCINE) 10000 UNIT/ML IJ SOLN
INTRAMUSCULAR | Status: AC
  Filled 2019-09-01: qty 1

## 2019-09-01 MED ORDER — PROPOFOL 500 MG/50ML IV EMUL
INTRAVENOUS | Status: AC
  Filled 2019-09-01: qty 50

## 2019-09-01 MED ORDER — SODIUM CHLORIDE 0.9 % IR SOLN
Status: DC | PRN
  Administered 2019-09-01: 14:00:00 1000 mL

## 2019-09-01 MED ORDER — METHOCARBAMOL 1000 MG/10ML IJ SOLN
1000.0000 mg | Freq: Four times a day (QID) | INTRAVENOUS | Status: DC
  Administered 2019-09-01: 19:00:00 1000 mg via INTRAVENOUS
  Filled 2019-09-01 (×9): qty 10

## 2019-09-01 MED ORDER — ONDANSETRON HCL 4 MG/2ML IJ SOLN
4.0000 mg | Freq: Four times a day (QID) | INTRAMUSCULAR | Status: DC | PRN
  Administered 2019-09-01: 20:00:00 4 mg via INTRAVENOUS
  Filled 2019-09-01: qty 2

## 2019-09-01 MED ORDER — EPHEDRINE SULFATE 50 MG/ML IJ SOLN
INTRAMUSCULAR | Status: DC | PRN
  Administered 2019-09-01 (×2): 10 mg via INTRAVENOUS

## 2019-09-01 MED ORDER — SODIUM CHLORIDE 0.9 % IV SOLN: 250.0000 mL | INTRAVENOUS | Status: DC

## 2019-09-01 MED ORDER — SUCCINYLCHOLINE CHLORIDE 20 MG/ML IJ SOLN
INTRAMUSCULAR | Status: AC
  Filled 2019-09-01: qty 1

## 2019-09-01 MED ORDER — KETOROLAC TROMETHAMINE 30 MG/ML IJ SOLN
INTRAMUSCULAR | Status: AC
  Administered 2019-09-01: 30 mg
  Filled 2019-09-01: qty 1

## 2019-09-01 MED ORDER — ACETAMINOPHEN 325 MG PO TABS: 650.0000 mg | ORAL_TABLET | ORAL | Status: DC | PRN

## 2019-09-01 MED ORDER — BUPIVACAINE LIPOSOME 1.3 % IJ SUSP
INTRAMUSCULAR | Status: AC
  Filled 2019-09-01: qty 20

## 2019-09-01 MED ORDER — KETOROLAC TROMETHAMINE 30 MG/ML IJ SOLN
30.0000 mg | Freq: Once | INTRAMUSCULAR | Status: AC
  Administered 2019-09-02: 01:00:00 30 mg via INTRAVENOUS
  Filled 2019-09-01: qty 1

## 2019-09-01 MED ORDER — HYDROMORPHONE HCL 1 MG/ML IJ SOLN
INTRAMUSCULAR | Status: AC
  Filled 2019-09-01: qty 1

## 2019-09-01 MED ORDER — ACETAMINOPHEN 650 MG RE SUPP: 650.0000 mg | RECTAL | Status: DC | PRN

## 2019-09-01 MED ORDER — SODIUM CHLORIDE 0.9 % IV SOLN
INTRAVENOUS | Status: DC | PRN
  Administered 2019-09-01: 14:00:00 30 ug/min via INTRAVENOUS

## 2019-09-01 MED ORDER — BACITRACIN 50000 UNITS IM SOLR
INTRAMUSCULAR | Status: AC
  Filled 2019-09-01: qty 1

## 2019-09-01 MED ORDER — BISACODYL 5 MG PO TBEC: 5.0000 mg | DELAYED_RELEASE_TABLET | Freq: Every day | ORAL | Status: DC | PRN

## 2019-09-01 MED ORDER — VARENICLINE TARTRATE 1 MG PO TABS
1.0000 mg | ORAL_TABLET | Freq: Two times a day (BID) | ORAL | Status: DC
  Filled 2019-09-01 (×3): qty 1

## 2019-09-01 MED ORDER — REMIFENTANIL HCL 1 MG IV SOLR
INTRAVENOUS | Status: DC | PRN
  Administered 2019-09-01: .1 ug/kg/min via INTRAVENOUS

## 2019-09-01 MED ORDER — SODIUM CHLORIDE 0.9% FLUSH: 3.0000 mL | Freq: Two times a day (BID) | INTRAVENOUS | Status: DC

## 2019-09-01 MED ORDER — PANTOPRAZOLE SODIUM 20 MG PO TBEC
20.0000 mg | DELAYED_RELEASE_TABLET | Freq: Every day | ORAL | Status: DC
  Administered 2019-09-02: 10:00:00 20 mg via ORAL
  Filled 2019-09-01 (×2): qty 1

## 2019-09-01 MED ORDER — PRAVASTATIN SODIUM 20 MG PO TABS
20.0000 mg | ORAL_TABLET | Freq: Every day | ORAL | Status: DC
  Filled 2019-09-01: qty 1

## 2019-09-01 MED ORDER — KETAMINE HCL 50 MG/ML IJ SOLN
INTRAMUSCULAR | Status: DC | PRN
  Administered 2019-09-01 (×2): 25 mg via INTRAVENOUS

## 2019-09-01 MED ORDER — ALBUTEROL SULFATE (2.5 MG/3ML) 0.083% IN NEBU: 2.5000 mg | INHALATION_SOLUTION | Freq: Four times a day (QID) | RESPIRATORY_TRACT | Status: DC | PRN

## 2019-09-01 MED ORDER — REMIFENTANIL HCL 1 MG IV SOLR
INTRAVENOUS | Status: AC
  Filled 2019-09-01: qty 1000

## 2019-09-01 MED ORDER — FENTANYL CITRATE (PF) 100 MCG/2ML IJ SOLN
INTRAMUSCULAR | Status: DC | PRN
  Administered 2019-09-01 (×2): 50 ug via INTRAVENOUS

## 2019-09-01 MED ORDER — MIDAZOLAM HCL 2 MG/2ML IJ SOLN
INTRAMUSCULAR | Status: DC | PRN
  Administered 2019-09-01: 2 mg via INTRAVENOUS

## 2019-09-01 MED ORDER — ONDANSETRON HCL 4 MG PO TABS: 4.0000 mg | ORAL_TABLET | Freq: Four times a day (QID) | ORAL | Status: DC | PRN

## 2019-09-01 MED ORDER — BUDESONIDE 0.25 MG/2ML IN SUSP: 0.2500 mg | Freq: Two times a day (BID) | RESPIRATORY_TRACT | Status: DC

## 2019-09-01 MED ORDER — BUPIVACAINE-EPINEPHRINE (PF) 0.5% -1:200000 IJ SOLN
INTRAMUSCULAR | Status: AC
  Filled 2019-09-01: qty 30

## 2019-09-01 MED ORDER — SODIUM CHLORIDE (PF) 0.9 % IJ SOLN
INTRAMUSCULAR | Status: AC
  Filled 2019-09-01: qty 20

## 2019-09-01 MED ORDER — SODIUM CHLORIDE 0.9% FLUSH: 3.0000 mL | INTRAVENOUS | Status: DC | PRN

## 2019-09-01 MED ORDER — NITROGLYCERIN 0.4 MG SL SUBL: 0.4000 mg | SUBLINGUAL_TABLET | SUBLINGUAL | Status: DC | PRN

## 2019-09-01 MED ORDER — SODIUM CHLORIDE 0.9 % IV SOLN: INTRAVENOUS | Status: DC

## 2019-09-01 MED ORDER — ONDANSETRON HCL 4 MG/2ML IJ SOLN: 4.0000 mg | Freq: Once | INTRAMUSCULAR | Status: DC | PRN

## 2019-09-01 MED ORDER — FENTANYL CITRATE (PF) 100 MCG/2ML IJ SOLN
50.0000 ug | INTRAMUSCULAR | Status: AC | PRN
  Administered 2019-09-01 (×4): 50 ug via INTRAVENOUS

## 2019-09-01 MED ORDER — GLYCOPYRROLATE 0.2 MG/ML IJ SOLN
INTRAMUSCULAR | Status: AC
  Filled 2019-09-01: qty 1

## 2019-09-01 MED ORDER — ONDANSETRON HCL 4 MG/2ML IJ SOLN
INTRAMUSCULAR | Status: DC | PRN
  Administered 2019-09-01 (×2): 4 mg via INTRAVENOUS

## 2019-09-01 MED ORDER — LIDOCAINE HCL (CARDIAC) PF 100 MG/5ML IV SOSY
PREFILLED_SYRINGE | INTRAVENOUS | Status: DC | PRN
  Administered 2019-09-01: 50 mg via INTRAVENOUS

## 2019-09-01 MED ORDER — GLYCOPYRROLATE 0.2 MG/ML IJ SOLN
INTRAMUSCULAR | Status: DC | PRN
  Administered 2019-09-01: 0.2 mg via INTRAVENOUS

## 2019-09-01 MED ORDER — SODIUM CHLORIDE FLUSH 0.9 % IV SOLN
INTRAVENOUS | Status: AC
  Filled 2019-09-01: qty 30

## 2019-09-01 MED ORDER — SUCCINYLCHOLINE CHLORIDE 20 MG/ML IJ SOLN
INTRAMUSCULAR | Status: DC | PRN
  Administered 2019-09-01: 100 mg via INTRAVENOUS

## 2019-09-01 MED ORDER — CITALOPRAM HYDROBROMIDE 20 MG PO TABS
40.0000 mg | ORAL_TABLET | ORAL | Status: DC
  Filled 2019-09-01: qty 2

## 2019-09-01 MED ORDER — METHYLPREDNISOLONE ACETATE 40 MG/ML IJ SUSP
INTRAMUSCULAR | Status: AC
  Filled 2019-09-01: qty 1

## 2019-09-01 MED ORDER — THROMBIN 5000 UNITS EX SOLR
CUTANEOUS | Status: DC | PRN
  Administered 2019-09-01: 5000 [IU] via TOPICAL

## 2019-09-01 MED ORDER — LIDOCAINE HCL (PF) 2 % IJ SOLN
INTRAMUSCULAR | Status: AC
  Filled 2019-09-01: qty 10

## 2019-09-01 MED ORDER — CIPROFLOXACIN IN D5W 400 MG/200ML IV SOLN
INTRAVENOUS | Status: AC
  Filled 2019-09-01: qty 200

## 2019-09-01 MED ORDER — PROPOFOL 10 MG/ML IV BOLUS
INTRAVENOUS | Status: AC
  Filled 2019-09-01: qty 20

## 2019-09-01 MED ORDER — ACETAMINOPHEN 500 MG PO TABS
1000.0000 mg | ORAL_TABLET | Freq: Four times a day (QID) | ORAL | Status: AC
  Administered 2019-09-02 (×3): 1000 mg via ORAL
  Filled 2019-09-01 (×3): qty 2

## 2019-09-01 MED ORDER — GABAPENTIN 300 MG PO CAPS
600.0000 mg | ORAL_CAPSULE | Freq: Two times a day (BID) | ORAL | Status: DC
  Administered 2019-09-02: 10:00:00 600 mg via ORAL
  Filled 2019-09-01: qty 2

## 2019-09-01 MED ORDER — MIDAZOLAM HCL 2 MG/2ML IJ SOLN
INTRAMUSCULAR | Status: AC
  Filled 2019-09-01: qty 2

## 2019-09-01 MED ORDER — BUPIVACAINE HCL (PF) 0.5 % IJ SOLN
INTRAMUSCULAR | Status: AC
  Filled 2019-09-01: qty 30

## 2019-09-01 MED ORDER — FENTANYL CITRATE (PF) 100 MCG/2ML IJ SOLN: 25.0000 ug | INTRAMUSCULAR | Status: DC | PRN

## 2019-09-01 MED ORDER — PROMETHAZINE HCL 25 MG/ML IJ SOLN
25.0000 mg | Freq: Four times a day (QID) | INTRAMUSCULAR | Status: DC | PRN
  Administered 2019-09-01: 25 mg via INTRAVENOUS
  Filled 2019-09-01: qty 1

## 2019-09-01 MED ORDER — HYDROMORPHONE HCL 1 MG/ML IJ SOLN
0.5000 mg | INTRAMUSCULAR | Status: DC | PRN
  Administered 2019-09-01: 0.5 mg via INTRAVENOUS
  Filled 2019-09-01: qty 1

## 2019-09-01 MED ORDER — OMEPRAZOLE MAGNESIUM 20 MG PO TBEC: 20.0000 mg | DELAYED_RELEASE_TABLET | ORAL | Status: DC

## 2019-09-01 MED ORDER — LISINOPRIL 10 MG PO TABS
10.0000 mg | ORAL_TABLET | ORAL | Status: DC
  Filled 2019-09-01: qty 1

## 2019-09-01 MED ORDER — LACTATED RINGERS IV SOLN
INTRAVENOUS | Status: DC
  Administered 2019-09-01 (×2): via INTRAVENOUS

## 2019-09-01 MED ORDER — ONDANSETRON HCL 4 MG/2ML IJ SOLN
INTRAMUSCULAR | Status: AC
  Filled 2019-09-01: qty 2

## 2019-09-01 MED ORDER — SENNA 8.6 MG PO TABS
1.0000 | ORAL_TABLET | Freq: Two times a day (BID) | ORAL | Status: DC
  Administered 2019-09-02: 8.6 mg via ORAL
  Filled 2019-09-01 (×2): qty 1

## 2019-09-01 MED ORDER — METHOCARBAMOL 500 MG PO TABS
1000.0000 mg | ORAL_TABLET | Freq: Four times a day (QID) | ORAL | Status: DC
  Administered 2019-09-02 (×4): 1000 mg via ORAL
  Filled 2019-09-01 (×5): qty 2

## 2019-09-01 MED ORDER — BUPIVACAINE-EPINEPHRINE (PF) 0.5% -1:200000 IJ SOLN
INTRAMUSCULAR | Status: DC | PRN
  Administered 2019-09-01: 5 mL

## 2019-09-01 MED ORDER — INFLUENZA VAC SPLIT QUAD 0.5 ML IM SUSY: 0.5000 mL | PREFILLED_SYRINGE | INTRAMUSCULAR | Status: DC

## 2019-09-01 SURGICAL SUPPLY — 90 items
ADH SKN CLS APL DERMABOND .7 (GAUZE/BANDAGES/DRESSINGS) ×1
AGENT HMST MTR 8 SURGIFLO (HEMOSTASIS) ×1
APL PRP STRL LF DISP 70% ISPRP (MISCELLANEOUS) ×2
BMAC2-60-01 PROCEDURE PACK ×2 IMPLANT
BULB RESERV EVAC DRAIN JP 100C (MISCELLANEOUS) IMPLANT
BUR NEURO DRILL SOFT 3.0X3.8M (BURR) ×3 IMPLANT
CANISTER SUCT 1200ML W/VALVE (MISCELLANEOUS) ×6 IMPLANT
CAP LOCKING SPINE (Cap) ×8 IMPLANT
CHLORAPREP W/TINT 26 (MISCELLANEOUS) ×6 IMPLANT
CNTNR SPEC 2.5X3XGRAD LEK (MISCELLANEOUS) ×1
CONT SPEC 4OZ STER OR WHT (MISCELLANEOUS) ×2
CONT SPEC 4OZ STRL OR WHT (MISCELLANEOUS) ×1
CONTAINER SPEC 2.5X3XGRAD LEK (MISCELLANEOUS) ×1 IMPLANT
COUNTER NEEDLE 20/40 LG (NEEDLE) ×3 IMPLANT
COVER LIGHT HANDLE STERIS (MISCELLANEOUS) ×6 IMPLANT
COVER WAND RF STERILE (DRAPES) ×3 IMPLANT
CUP MEDICINE 2OZ PLAST GRAD ST (MISCELLANEOUS) ×6 IMPLANT
DERMABOND ADVANCED (GAUZE/BANDAGES/DRESSINGS) ×2
DERMABOND ADVANCED .7 DNX12 (GAUZE/BANDAGES/DRESSINGS) ×1 IMPLANT
DRAIN CHANNEL JP 10F RND 20C F (MISCELLANEOUS) IMPLANT
DRAPE C-ARM 42X72 X-RAY (DRAPES) ×6 IMPLANT
DRAPE C-ARMOR (DRAPES) ×3 IMPLANT
DRAPE INCISE IOBAN 66X45 STRL (DRAPES) ×3 IMPLANT
DRAPE LAPAROTOMY 100X77 ABD (DRAPES) ×3 IMPLANT
DRAPE MICROSCOPE SPINE 48X150 (DRAPES) ×3 IMPLANT
DRAPE POUCH INSTRU U-SHP 10X18 (DRAPES) ×3 IMPLANT
DRAPE SURG 17X11 SM STRL (DRAPES) ×12 IMPLANT
DRSG OPSITE POSTOP 4X8 (GAUZE/BANDAGES/DRESSINGS) IMPLANT
DRSG TEGADERM 4X4.75 (GAUZE/BANDAGES/DRESSINGS) IMPLANT
ELECT CAUTERY BLADE TIP 2.5 (TIP) ×3
ELECT EZSTD 165MM 6.5IN (MISCELLANEOUS) ×3
ELECT REM PT RETURN 9FT ADLT (ELECTROSURGICAL) ×3
ELECTRODE CAUTERY BLDE TIP 2.5 (TIP) ×1 IMPLANT
ELECTRODE EZSTD 165MM 6.5IN (MISCELLANEOUS) ×1 IMPLANT
ELECTRODE REM PT RTRN 9FT ADLT (ELECTROSURGICAL) ×1 IMPLANT
FRAME EYE SHIELD (PROTECTIVE WEAR) ×6 IMPLANT
GAUZE XEROFORM 4X4 STRL (GAUZE/BANDAGES/DRESSINGS) IMPLANT
GLOVE BIO SURGEON STRL SZ 6.5 (GLOVE) ×4 IMPLANT
GLOVE BIO SURGEONS STRL SZ 6.5 (GLOVE) ×2
GLOVE BIOGEL PI IND STRL 7.0 (GLOVE) ×1 IMPLANT
GLOVE BIOGEL PI INDICATOR 7.0 (GLOVE) ×2
GLOVE SURG SYN 7.0 (GLOVE) ×6 IMPLANT
GLOVE SURG SYN 7.0 PF PI (GLOVE) ×2 IMPLANT
GLOVE SURG SYN 8.5  E (GLOVE) ×6
GLOVE SURG SYN 8.5 E (GLOVE) ×3 IMPLANT
GLOVE SURG SYN 8.5 PF PI (GLOVE) ×3 IMPLANT
GOWN SRG XL LVL 3 NONREINFORCE (GOWNS) ×1 IMPLANT
GOWN STRL NON-REIN TWL XL LVL3 (GOWNS) ×3
GOWN STRL REUS W/TWL MED LVL3 (GOWN DISPOSABLE) ×3 IMPLANT
GRADUATE 1200CC STRL 31836 (MISCELLANEOUS) ×3 IMPLANT
HEMOVAC 400CC 10FR (MISCELLANEOUS) ×2 IMPLANT
INTERBODY SABLE 10X26 7-14 15D (Miscellaneous) ×2 IMPLANT
K-WIRE SPINE BLUNT TIP 1.6X500 (WIRE) ×12
KIT ASP BONE MRW 60CC (KITS) ×2 IMPLANT
KIT SPINAL PRONEVIEW (KITS) ×3 IMPLANT
KNIFE BAYONET SHORT DISCETOMY (MISCELLANEOUS) IMPLANT
KWIRE SPINE BLUNT TIP 1.6X500 (WIRE) IMPLANT
MARKER SKIN DUAL TIP RULER LAB (MISCELLANEOUS) ×6 IMPLANT
MILL MEDIUM DISP (BLADE) IMPLANT
NDL BONE TROCAR TIP 8G (NEEDLE) IMPLANT
NDL SAFETY ECLIPSE 18X1.5 (NEEDLE) ×1 IMPLANT
NEEDLE BONE TROCAR TIP 8G (NEEDLE) ×3 IMPLANT
NEEDLE HYPO 18GX1.5 SHARP (NEEDLE) ×3
NEEDLE HYPO 22GX1.5 SAFETY (NEEDLE) ×3 IMPLANT
NS IRRIG 1000ML POUR BTL (IV SOLUTION) ×3 IMPLANT
PACK LAMINECTOMY NEURO (CUSTOM PROCEDURE TRAY) ×3 IMPLANT
PAD ARMBOARD 7.5X6 YLW CONV (MISCELLANEOUS) ×3 IMPLANT
PUTTY DBM PROPEL LRG (Putty) ×1 IMPLANT
PUTTY PROPEL LRG (Putty) ×1 IMPLANT
REVOLVE Bone Access Needle,8G, trocar tip ×4 IMPLANT
ROD TI CVD 5.5X40 (Rod) ×4 IMPLANT
SCREW CANN SPINE 6.5X45 (Screw) ×8 IMPLANT
SCREW REDUCTION 10 TULIP HEAD (Head) ×8 IMPLANT
SPOGE SURGIFLO 8M (HEMOSTASIS) ×2
SPONGE DRAIN TRACH 4X4 STRL 2S (GAUZE/BANDAGES/DRESSINGS) IMPLANT
SPONGE SURGIFLO 8M (HEMOSTASIS) ×1 IMPLANT
STAPLER SKIN PROX 35W (STAPLE) IMPLANT
SUT DVC VLOC 3-0 CL 6 P-12 (SUTURE) ×3 IMPLANT
SUT VIC AB 0 CT1 27 (SUTURE) ×3
SUT VIC AB 0 CT1 27XCR 8 STRN (SUTURE) ×1 IMPLANT
SUT VIC AB 2-0 CT1 18 (SUTURE) ×3 IMPLANT
SYR 20ML LL LF (SYRINGE) ×3 IMPLANT
SYR 30ML LL (SYRINGE) ×6 IMPLANT
SYR 3ML LL SCALE MARK (SYRINGE) ×3 IMPLANT
TOWEL OR 17X26 4PK STRL BLUE (TOWEL DISPOSABLE) ×9 IMPLANT
TRAY FOLEY SLVR 16FR LF STAT (SET/KITS/TRAYS/PACK) ×2 IMPLANT
TUBE METRX 18MMX5CM (INSTRUMENTS) ×2 IMPLANT
TUBING CONNECTING 10 (TUBING) ×2 IMPLANT
TUBING CONNECTING 10' (TUBING) ×1
guide wire ×8 IMPLANT

## 2019-09-01 NOTE — Anesthesia Procedure Notes (Signed)
Procedure Name: Intubation Performed by: Ilaisaane Marts, CRNA Pre-anesthesia Checklist: Patient identified, Patient being monitored, Timeout performed, Emergency Drugs available and Suction available Patient Re-evaluated:Patient Re-evaluated prior to induction Oxygen Delivery Method: Circle system utilized Preoxygenation: Pre-oxygenation with 100% oxygen Induction Type: IV induction Ventilation: Mask ventilation without difficulty Laryngoscope Size: McGraph and 4 Grade View: Grade I Tube type: Oral Tube size: 7.5 mm Number of attempts: 1 Airway Equipment and Method: Stylet,  Video-laryngoscopy and LTA kit utilized Placement Confirmation: ETT inserted through vocal cords under direct vision,  positive ETCO2 and breath sounds checked- equal and bilateral Secured at: 22 cm Tube secured with: Tape Dental Injury: Teeth and Oropharynx as per pre-operative assessment        

## 2019-09-01 NOTE — Anesthesia Preprocedure Evaluation (Signed)
Anesthesia Evaluation  Patient identified by MRN, date of birth, ID band Patient awake    Reviewed: Allergy & Precautions, NPO status , Patient's Chart, lab work & pertinent test results  History of Anesthesia Complications Negative for: history of anesthetic complications  Airway Mallampati: III       Dental  (+) Edentulous Upper, Edentulous Lower   Pulmonary neg sleep apnea, neg COPD, Not current smoker, former smoker,           Cardiovascular hypertension, Pt. on medications + CAD and + CABG  (-) Past MI (-) dysrhythmias (-) Valvular Problems/Murmurs     Neuro/Psych neg Seizures Depression    GI/Hepatic Neg liver ROS, PUD, GERD  Medicated and Controlled,  Endo/Other  diabetes (borderline, no meds), Type 2  Renal/GU negative Renal ROS     Musculoskeletal   Abdominal   Peds  Hematology   Anesthesia Other Findings   Reproductive/Obstetrics                             Anesthesia Physical Anesthesia Plan  ASA: III  Anesthesia Plan: General   Post-op Pain Management:    Induction: Intravenous  PONV Risk Score and Plan: 2 and Dexamethasone and Ondansetron  Airway Management Planned: Oral ETT  Additional Equipment:   Intra-op Plan:   Post-operative Plan:   Informed Consent: I have reviewed the patients History and Physical, chart, labs and discussed the procedure including the risks, benefits and alternatives for the proposed anesthesia with the patient or authorized representative who has indicated his/her understanding and acceptance.       Plan Discussed with:   Anesthesia Plan Comments:         Anesthesia Quick Evaluation

## 2019-09-01 NOTE — Anesthesia Post-op Follow-up Note (Signed)
Anesthesia QCDR form completed.        

## 2019-09-01 NOTE — H&P (Signed)
HISTORY OF PRESENT ILLNESS: 09/01/2019 [name] presents today with continued symptoms impacting his quality of life.  He would like to move forward with surgical intervention.  07/29/2019 [name] returns to see me. Since her last visit, he had bilateral S1 transforaminal epidural injections on July 16, 2019. This did not provide him any substantial relief after 2 days post procedure.[name] He continues to have severe pain down his left leg. He is also having severe back pain. 07/08/2019 [name] is 16 months status post 2 level ACDF C3-C5.   [name] continues to have some symptoms of neck discomfort, but this is not his primary complaint. His low back is bothering him substantially more than his neck. He is doing physical therapy currently. He starts to get pain in the left side of his back and also into his right foot. He does have some weakness of his right foot with lifting of his toe. He has not tried injections for this.  He also describes pain prickly in the left side of the back of his calf when he stands for more than 5 or 10 minutes. He is able to walk approximately 100 to 200 feet before getting the symptoms. When he sits down they go away.  Social History   Socioeconomic History  . Marital status: Married    Spouse name: Not on file  . Number of children: Not on file  . Years of education: Not on file  . Highest education level: Not on file  Occupational History  . Not on file  Social Needs  . Financial resource strain: Not on file  . Food insecurity    Worry: Not on file    Inability: Not on file  . Transportation needs    Medical: Not on file    Non-medical: Not on file  Tobacco Use  . Smoking status: Former Smoker    Packs/day: 1.00    Years: 50.00    Pack years: 50.00    Types: Cigarettes    Quit date: 02/07/2018    Years since quitting: 1.5  . Smokeless tobacco: Never Used  Substance and Sexual Activity  . Alcohol use: Yes    Comment: rarely   . Drug use: No  . Sexual activity: Yes  Lifestyle  . Physical activity    Days per week: Not on file    Minutes per session: Not on file  . Stress: Not on file  Relationships  . Social Herbalist on phone: Not on file    Gets together: Not on file    Attends religious service: Not on file    Active member of club or organization: Not on file    Attends meetings of clubs or organizations: Not on file    Relationship status: Not on file  . Intimate partner violence    Fear of current or ex partner: Not on file    Emotionally abused: Not on file    Physically abused: Not on file    Forced sexual activity: Not on file  Other Topics Concern  . Not on file  Social History Narrative  . Not on file    History reviewed. No pertinent family history.  Current Meds  Medication Sig  . albuterol (VENTOLIN HFA) 108 (90 Base) MCG/ACT inhaler Inhale 1-2 puffs into the lungs every 6 (six) hours as needed for wheezing or shortness of breath.  . Cholecalciferol (VITAMIN D3 PO) Take 1 tablet by mouth daily.  . citalopram (  CELEXA) 40 MG tablet Take 40 mg by mouth every morning.   . fluticasone (FLOVENT DISKUS) 50 MCG/BLIST diskus inhaler Inhale 1 puff into the lungs 2 (two) times daily as needed (for respiratory issues.).   [name] lisinopril (PRINIVIL,ZESTRIL) 10 MG tablet Take 10 mg by mouth every morning.   [name] omeprazole (PRILOSEC OTC) 20 MG tablet Take 20 mg by mouth every morning.  . pravastatin (PRAVACHOL) 20 MG tablet Take 20 mg by mouth at bedtime.    Allergies  Allergen Reactions  . Penicillins Hives    Has patient had a PCN reaction causing immediate rash, facial/tongue/throat swelling, SOB or lightheadedness with hypotension: Yes Has patient had a PCN reaction causing severe rash involving mucus membranes or skin necrosis: Unknown Has patient had a PCN reaction that required hospitalization: Yes Has patient had a PCN reaction occurring within the last 10 years: No If all of the  above answers are "NO", then may proceed with Cephalosporin use.   . Atorvastatin Other (See Comments)     PHYSICAL EXAMINATION:  Today's Vitals   09/01/19 1150  BP: (!) 164/81  Pulse: (!) 58  Resp: 16  Temp: (!) 97.3 F (36.3 C)  TempSrc: Temporal  SpO2: 98%  Weight: 80.7 kg  Height: 5\' 11"  (1.803 m)  PainSc: 6    Body mass index is 24.83 kg/m.  Heart sounds normal no MRG. Chest Clear to Auscultation Bilaterally.   General: Patient is well-developed, well-nourished, and in no apparent distress Skin: Incision site is clean, dry, and intact. Healing well, glue present. No erythema, edema, drainage, tenderness, warmth, fluctuance. Neurological: Awake, alert, oriented to person, place, and time. Pupils equal round and reactive to light. Facial tone is symmetric. No pronator drift.   Strength: Side Biceps Triceps Deltoid Interossei Grip Wrist Ext. Wrist Flex.  R 4 5 4+ 5 5 5 5   L 5 5 5 5 5 5 5    5/5 to BLE except 4+ L EHL  Sensory: Decreased sensation in fingers, dorsal surface of hands and forearms bilaterally.  Imaging  CT C spine 06/21/2019 IMPRESSION: 1. No acute findings. 2. Fixation hardware appears intact and stable in alignment at the C3 through C6 levels. Cervical spine is stable in alignment, with stable mild retrolisthesis of C4 resulting in moderate central canal stenosis. 3. Degenerative hypertrophy of the uncovertebral and facet joints causing moderate LEFT neural foramen stenosis at the C5-6 level and mild-to-moderate RIGHT neural foramen stenosis at the C3-4 level. These are possible sites of nerve root impingement. 4. Bilateral carotid atherosclerosis. 5. Emphysematous changes at the lung apices, at least moderate in degree.  Emphysema (ICD10-J43.9).  Electronically Signed By: Franki Cabot M.D. On: 06/21/2019 21:13  MRI L spine 06/21/2019 IMPRESSION: Moderately severe to severe central canal stenosis at L3-4 where there is right worse  than left subarticular recess narrowing which could impact either L4 root. Moderately severe to severe foraminal narrowing at this level is worse on the left.  Moderate to moderately severe central canal stenosis at L4-5 where there is narrowing in the subarticular recesses which could impact either descending L5 root. Moderately severe to severe bilateral foraminal narrowing at this level is worse on the left.  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 Disc and anterolisthesis cause marked bilateral foraminal narrowing and impingement on the exiting L5 roots.  Mild to moderate central canal stenosis at L2-3 where there is right worse than left subarticular recess narrowing. Extraforaminal bulging disc in the  right likely contacts the exited right L2 root.  Electronically Signed By: Inge Rise M.D. On: 06/21/2019 15:16  L2/3  L3/4  L4/5  L5/S1  Flexion-extension x-rays today show severe spondylosis worst at L5-S1. At L5-S1, he has near normal alignment on extension with translation of L5 on S1 of approximately 10 mm in flexion.  Assessment / Plan: Anastasia Dehoog doing well status post 2 level ACDF at C3-C5 for cervical myelopathy.   His primary issue today continues to be his low back. He has severe stenosis at multiple levels in his low back. He is also suffering from anterolisthesis of L5 on S1 due to bilateral pars defects at L5. He has severe back pain from this. He failed injections as well as physical therapy.  We will proceed with surgical intervention.  Meade Maw MD, [facility] Department of Neurosurgery

## 2019-09-01 NOTE — Anesthesia Postprocedure Evaluation (Signed)
Anesthesia Post Note  Patient: [name]  Procedure(s) Performed: LUMBAR LAMINECTOMY/DECOMPRESSION MICRODISCECTOMY 3 LEVELS L2-L5 (N/A Back) MINIMALLY INVASIVE (MIS) TRANSFORAMINAL LUMBAR INTERBODY FUSION (TLIF) 1 LEVEL L5-S1 (N/A Back) BONE MARROW ASPIRATION FOR SPINE FUSION ONLY (BMAC) (N/A Back)  Patient location during evaluation: PACU Anesthesia Type: General Level of consciousness: awake and alert Pain management: pain level controlled Vital Signs Assessment: post-procedure vital signs reviewed and stable Respiratory status: spontaneous breathing, nonlabored ventilation, respiratory function stable and patient connected to nasal cannula oxygen Cardiovascular status: blood pressure returned to baseline and stable Postop Assessment: no apparent nausea or vomiting Anesthetic complications: no     Last Vitals:  Vitals:   09/01/19 1814 09/01/19 1829  BP: 108/63 136/69  Pulse: 74 73  Resp: 18 16  Temp: 36.7 C   SpO2: 96% 98%    Last Pain:  Vitals:   09/01/19 1150  TempSrc: Temporal  PainSc: 6                  [name]

## 2019-09-01 NOTE — Op Note (Signed)
Indications: Mr. Carnett is a 63 yo male who presented with pars defect of the lumbar spine as well as lumbar stenosis with neurogenic claudication.  He tried and failed conservative management, and presents now for operative intervention.  Findings: Bilateral L5 pars defects, lumbar stenosis  Preoperative Diagnosis: Lumbar Stenosis with neurogenic claudication Postoperative Diagnosis: same   EBL: 100 ml IVF: 1100 ml Drains: 1 placed Disposition: Extubated and Stable to PACU Complications: none  A foley catheter was placed.   Preoperative Note:   Risks of surgery discussed include: infection, bleeding, stroke, coma, death, paralysis, CSF leak, nerve/spinal cord injury, numbness, tingling, weakness, complex regional pain syndrome, recurrent stenosis and/or disc herniation, vascular injury, development of instability, neck/back pain, need for further surgery, persistent symptoms, development of deformity, and the risks of anesthesia. The patient understood these risks and agreed to proceed.  Operative Note:  1. Transforaminal Lumbar Interbody Fusion L5/S1 2. Posterolateral arthrodesis L5 to S1 3. Posterior nonsegmental instrumentation L5 to S1 using Globus Creo 4. Lumbar decompression including central decompression, bilateral medial facetectomies, and bilateral foraminotomies at L2/3, L3/4, and L4/5 5. Harvesting of autograft via the same incision 6. Placement of a biomechanical device ([place]) at L5/S1 for anterior arthrodesis 7. Bone marrow aspiration via a separate incision  The patient was brought to the Operating Room, intubated and turned into the prone position. All pressure points were checked and double checked. Flouroscopy was used to mark bilateral Wiltse incisions. The patient was prepped and draped in the standard fashion. A full timeout was performed. Preoperative antibiotics were given. The incisions were injected with local anesthetic.  A stab incision was made  on the left, then the left iliac crest was accessed for bone marrow aspiration.  60 ml of aspirate were removed and handed off for preparation for later use.   The right Wiltse incision was opened, then a medial fascial incision was made for the TLIF. Metrx tubes were advanced over the R L5/S1 facet.  A 22x40 mm tube was secured and placed.  The bovie was used to expose the base of the spinous process of L5 and the right hemilamina, including the right L5/S1 facet. The microscope was brought into the field.  The right L5/S1 facet was removed with osteotomes and the drill, and handed off for preparation as autograft. The traversing and exiting nerve roots on the right were identified and protected. The disc was opened using an osteotome due to the overhanging S1 osteophyte. After incising the disc space, we took a combination of pituitary rongeurs, Kerrison rongeurs, disc scrapers, and curettes to remove a majority of the disc material.  We prepared the end plates for accepting the interbody fusion.  We removed the cartilaginous plate, preserved the cortical endplate if possible during this procedure.  The disc space was irrigated. A mixture of bone marrow aspirate and demineralized bone matrix was packed into the disc space.  The Globus Sable  TLIF biomechanical device (7-14 mm height x 10* mm width x 26 mm length, 15 degree lordotic) was inserted using flouroscopy, then backfilled with a mixture of allograft and BMAC. During placement, the nerve roots and dural sac were carefully protected without any leaks identified. After placement of the device, the screw-screw retractor was removed.  After placement of the biomechanical device, additional compression of the neural elements was noted. To decompress the neural elements at L5/S1, the drill was used to extend the laminoforaminotomy laterally until the traversing and exiting nerve roots were fully decompressed.  Additionally, the drill was used to removed the  base of the spinous process and the contralateral lamina until the contralateral medial facet was identified and palpated. Using a [facility] and curettes, the ligamentum flavum at L5/S1 was dissected free of the dura. Using punches and curettes, the ligamentum flavum was removed at L5/S1 for a complete decompression from facet to facet.  The contralateral S1 nerve root was palpated, and the Kerrison punch used to remove the remainder of the medial facet and foramen until the dural sac and nerve roots were complete decompressed. Hemostasis was achieved.  We then placed pedicle screws.    The left incision was then opened with a scalpel, bovie electrocautery was used to open the fascia. Using en fosse flouroscopy, a Jamsheedi needle was placed at the center portion of the L5 pedicle radiographically.  The stylet was removed, and a K wire placed and secured.   We then moved to S1. Using en fosse flouroscopy, a Jamsheedi needle was placed at the center portion of the S1 pedicle radiographically.  The stylet was removed, and a K wire placed and secured.   After placement of all K wires, lateral flouroscopy was used to confirm placement in the pedicles. We then placed Globus Creo pedicle screw shafts at each level (6.5 mm x 45 mm at L5, and 6.5 mm x 45 mm at S1).   Rods were measured and placed. The rods were secured using locking caps to manufacturer's specifications. Final AP and lateral radiographs were taken to confirm placement of instrumentation and appropriate alignment. The wound was copiously irrigated, then the external surfaces of the exposed lamina, facet, and transverse processes from L5 to S1 were decorticated. A mixture of allograft and autograft was placed over the decorticated surfaces for arthrodesis.    After performing the TLIF at L5-S1, the metrx tubes were sequentially advanced and confirmed in position at L4-5. An 25mm by 62mm tube was locked in place to the bed side attachment.   Fluoroscopy was then removed from the field.  The microscope was then sterilely brought into the field and muscle creep was hemostased with a bipolar and resected with a pituitary rongeur.  A Bovie extender was then used to expose the spinous process and lamina.  Careful attention was placed to not violate the facet capsule. A 3 mm matchstick drill bit was then used to make a hemi-laminotomy trough until the ligamentum flavum was exposed.  This was extended to the base of the spinous process and to the contralateral side to remove all the central bone from each side.  Once this was complete and the underlying ligamentum flavum was visualized, it was dissected with a curette and resected with Kerrison rongeurs.  Extensive ligamentum hypertrophy was noted, requiring a substantial amount of time and care for removal.  The dura was identified and palpated. The kerrison rongeur was then used to remove the medial facet bilaterally until no compression was noted.  A balltip probe was used to confirm decompression of the ipsilateral L5 nerve root.  Additional attention was paid to completion of the contralateral foraminotomy until the contralateral L5 nerve root was completely free.  Once this was complete, L4-5 central decompression including medial facetectomy and foraminotomy was confirmed and decompression on both sides was confirmed. No CSF leak was noted.  After performing the decompression at L4-5, the metrx tubes were sequentially advanced and confirmed in position at L3-4. An 38mm by 16mm tube was locked in place to the bed side  attachment.  Fluoroscopy was then removed from the field.  The microscope was then sterilely brought into the field and muscle creep was hemostased with a bipolar and resected with a pituitary rongeur.  A Bovie extender was then used to expose the spinous process and lamina.  Careful attention was placed to not violate the facet capsule. A 3 mm matchstick drill bit was then used to  make a hemi-laminotomy trough until the ligamentum flavum was exposed.  This was extended to the base of the spinous process and to the contralateral side to remove all the central bone from each side.  Once this was complete and the underlying ligamentum flavum was visualized, it was dissected with a curette and resected with Kerrison rongeurs.  Extensive ligamentum hypertrophy was noted, requiring a substantial amount of time and care for removal.  The dura was identified and palpated. The kerrison rongeur was then used to remove the medial facet bilaterally until no compression was noted.  A balltip probe was used to confirm decompression of the ipsilateral L4 nerve root.  Additional attention was paid to completion of the contralateral foraminotomy until the contralateral L4 nerve root was completely free.  Once this was complete, L3-4 central decompression including medial facetectomy and foraminotomy was confirmed and decompression on both sides was confirmed. No CSF leak was noted.  After performing the decompression at L3-4, the metrx tubes were sequentially advanced and confirmed in position at L2-3. An 73mm by 75mm tube was locked in place to the bed side attachment.  Fluoroscopy was then removed from the field.  The microscope was then sterilely brought into the field and muscle creep was hemostased with a bipolar and resected with a pituitary rongeur.  A Bovie extender was then used to expose the spinous process and lamina.  Careful attention was placed to not violate the facet capsule. A 3 mm matchstick drill bit was then used to make a hemi-laminotomy trough until the ligamentum flavum was exposed.  This was extended to the base of the spinous process and to the contralateral side to remove all the central bone from each side.  Once this was complete and the underlying ligamentum flavum was visualized, it was dissected with a curette and resected with Kerrison rongeurs.  Extensive ligamentum  hypertrophy was noted, requiring a substantial amount of time and care for removal.  The dura was identified and palpated. The kerrison rongeur was then used to remove the medial facet bilaterally until no compression was noted.  A balltip probe was used to confirm decompression of the ipsilateral L3 nerve root.  Additional attention was paid to completion of the contralateral foraminotomy until the contralateral L3 nerve root was completely free.  Once this was complete, L2-3 central decompression including medial facetectomy and foraminotomy was confirmed and decompression on both sides was confirmed. No CSF leak was noted.  We then copiously irrigated all sites.    A subfascial drain was placed on the right through all the decompressions and over the TLIF site.   After hemostasis, the wound was closed in layers with 0 and 2-0 vicryl. 3-0 monocryl and dermabond was applied to the incision. A sterile dressing was placed.  The patient was then flipped supine and extubated with incident. All counts were correct times 2 at the end of the case. No immediate complications were noted.  Marin Olp PA acted as an Pensions consultant for the entire case.  Meade Maw MD

## 2019-09-01 NOTE — Progress Notes (Signed)
Notified MD of pt excess vomiting. Zofran given earlier with no success. New orders for [place] IV. Pdowless, rn

## 2019-09-01 NOTE — Progress Notes (Signed)
Procedure: L5-S1 TLIF, L2-5 Procedure date: 09/01/2019 Diagnosis: Neurogenic claudication  History: [name] is s/p L5-S1 TLIF and L2-5 decompression  POD0: Tolerated procedure well.  Evaluated in postop recovery still disoriented from anesthesia.  Complains of significant back pain at this time.  Physical Exam: Vitals:   09/01/19 1150 09/01/19 1814  BP: (!) 164/81 108/63  Pulse: (!) 58 74  Resp: 16 18  Temp: (!) 97.3 F (36.3 C) 98 F (36.7 C)  SpO2: 98% 96%   Strength: Able to move all extremities independently Sensation: Unable to accurately assess at this time Skin: Dressings clean and dry. No active bleeding from drain insertion site.   Data:  No results for input(s): NA, K, CL, CO2, BUN, CREATININE, LABGLOM, GLUCOSE, CALCIUM in the last 168 hours. No results for input(s): AST, ALT, ALKPHOS in the last 168 hours.  Invalid input(s): TBILI   No results for input(s): WBC, HGB, HCT, PLT in the last 168 hours. Recent Labs  Lab 08/30/19 0819  APTT 34  INR 1.0         Other tests/results: Lumbar xray pending  Assessment/Plan:  [name] is POD0 s/p L5-S1 TLIF, L2-5 decompression. Will continue to monitor.   - monitor drain output - mobilize - pain control - DVT prophylaxis - PTOT  Marin Olp PA-C Department of Neurosurgery

## 2019-09-01 NOTE — Transfer of Care (Signed)
Immediate Anesthesia Transfer of Care Note  Patient: [name]  Procedure(s) Performed: LUMBAR LAMINECTOMY/DECOMPRESSION MICRODISCECTOMY 3 LEVELS L2-L5 (N/A Back) MINIMALLY INVASIVE (MIS) TRANSFORAMINAL LUMBAR INTERBODY FUSION (TLIF) 1 LEVEL L5-S1 (N/A Back) BONE MARROW ASPIRATION FOR SPINE FUSION ONLY (BMAC) (N/A Back)  Patient Location: PACU  Anesthesia Type:General  Level of Consciousness: sedated  Airway & Oxygen Therapy: Patient connected to face mask oxygen  Post-op Assessment: Post -op Vital signs reviewed and stable  Post vital signs: stable  Last Vitals:  Vitals Value Taken Time  BP 108/63 09/01/19 1814  Temp    Pulse 73 09/01/19 1815  Resp 18 09/01/19 1815  SpO2 96 % 09/01/19 1815  Vitals shown include unvalidated device data.  Last Pain:  Vitals:   09/01/19 1150  TempSrc: Temporal  PainSc: 6       Patients Stated Pain Goal: 0 ([id number])  Complications: No apparent anesthesia complications

## 2019-09-02 ENCOUNTER — Inpatient Hospital Stay: Payer: Medicare Other

## 2019-09-02 LAB — GLUCOSE, CAPILLARY
Glucose-Capillary: 90 mg/dL (ref 70–99)
Glucose-Capillary: 27 mg/dL — CL (ref 70–99)
Glucose-Capillary: 133 mg/dL — ABNORMAL HIGH (ref 70–99)
Glucose-Capillary: 147 mg/dL — ABNORMAL HIGH (ref 70–99)

## 2019-09-02 MED ORDER — OXYCODONE HCL 5 MG PO TABS: 5.0000 mg | ORAL_TABLET | ORAL | 0 refills | Status: DC | PRN

## 2019-09-02 MED ORDER — TIZANIDINE HCL 4 MG PO TABS: 4.0000 mg | ORAL_TABLET | Freq: Four times a day (QID) | ORAL | 0 refills | Status: DC | PRN

## 2019-09-02 MED ORDER — CHLORHEXIDINE GLUCONATE CLOTH 2 % EX PADS
6.0000 | MEDICATED_PAD | Freq: Every day | CUTANEOUS | Status: DC
  Administered 2019-09-02: 6 via TOPICAL

## 2019-09-02 MED ORDER — INSULIN ASPART 100 UNIT/ML ~~LOC~~ SOLN
0.0000 [IU] | Freq: Three times a day (TID) | SUBCUTANEOUS | Status: DC
  Administered 2019-09-02: 2 [IU] via SUBCUTANEOUS

## 2019-09-02 NOTE — Discharge Summary (Signed)
Procedure: L5-S1 TLIF, L2-5 Procedure date: 09/01/2019 Diagnosis: Neurogenic claudication  History: [name] is s/p L5-S1 TLIF and L2-5 decompression  POD1: He is recovering well. Nausea/vomiting has improved. Awaiting breakfast to see how well he can tolerate food. Complains of back pain. Lower extremity pain that was present prior to surgery has resolved. Denies any new numbness/tingling.  He has not ambulated yet. Drain output overnight 50.   UPDATE: pain rated 3/10. Overall pain well controlled with current regimen. He was able to ambulate with PT without issue today. Voiding and eating without issue. Drain output since this AM was 25.   POD0: Tolerated procedure well.  Evaluated in postop recovery still disoriented from anesthesia.  Complains of significant back pain at this time.  Physical Exam: Vitals:   09/02/19 1130 09/02/19 1735  BP:  138/88  Pulse: 62 63  Resp:    Temp:  98.3 F (36.8 C)  SpO2: 92% 97%   Strength: 5/5 throughout lower extremities.  Skin: Scant blood drainage from lumbar incision sites.  No active bleeding from drain insertion site.   Data:  No results for input(s): NA, K, CL, CO2, BUN, CREATININE, LABGLOM, GLUCOSE, CALCIUM in the last 168 hours. No results for input(s): AST, ALT, ALKPHOS in the last 168 hours.  Invalid input(s): TBILI   No results for input(s): WBC, HGB, HCT, PLT in the last 168 hours. Recent Labs  Lab 08/30/19 0819  APTT 34  INR 1.0         Other tests/results:  EXAM: LUMBAR SPINE - 2-3 VIEW 09/02/2019  IMPRESSION: 1. No complicating feature status post L5-S1 PLIF. 2. Degenerative disc disease at L4-5. Endplate sclerosis at [id number] L4-5. 3. Drainage tubing in the right posterior subcutaneous region.  Assessment/Plan:  [name] is POD1 s/p L5-S1 TLIF, L2-5 decompression. Drain output minimal through the day. Drain pulled without issue and dressed with light gauze and paper tape. No additional drainage  noted on lumbar dressings since this AM. Pain well controlled with tylenol, muscle relaxer, and pain medication. Ambulated and voided without issue. He is motivated to get home. Discussed wound care and activity restrictions.  He is scheduled to follow up in 2 weeks to monitor progress. Advised to contact office if any questions or concerns arise before then.   Marin Olp PA-C Department of Neurosurgery

## 2019-09-02 NOTE — Evaluation (Signed)
Occupational Therapy Evaluation Patient Details Name: [name] MRN: [id number] DOB: 1956/05/27 Today's Date: 09/02/2019    History of Present Illness 63 yo male s/p L5-S1 TLIF and L2-5 decompression on 09/01/19.   Clinical Impression   Pt seen for OT evaluation this date, POD#1 from lumbar surgery above. Prior to hospital admission, pt was independent with mobility, modified independent with ADL and IADL 2/2 decreased ROM/strength and pain with movement in R shoulder from previous injury/surgeries. No falls in past 12 months. Pt does endorse ambulating with a "slumped" posture 2/2 low back pain. Pt lives with spouse, daughter, 3 grandkids, and dogs in a single family home with 8 steps to enter and R handrail with family able to provide 24/7 assist/support as needed for pt. Pt reports having goats, chickens, and rabbits that he cares for at home. Currently pt requires CGA for functional transfers and LB ADL tasks when transitioning to standing due to decreased ROM, impaired dynamic standing balance, new back precautions in place, and lower back pain. Pt ambulated from EOB to recliner with handheld assist. PT notified that pt may benefit from trial of [facility] for mobility. Pt instructed in back precautions with handout provided, self care skills, bed mobility and functional transfer training, AE/DME for bathing, dressing, and toileting needs, pet care considerations and home/routines modifications and falls prevention strategies to maximize safety and functional independence while minimizing falls risk and maintaining precautions. Pt verbalized understanding of all education/training provided. Handout provided to support recall and carry over of learned precautions/techniques for bed mobility, functional transfers, and self care skills. No additional skilled OT needs at this time. Will discharge in house. Upon hospital discharge, pt safe to discharge home.    Follow Up Recommendations  No OT follow up     Equipment Recommendations  3 in 1 bedside commode    Recommendations for Other Services       Precautions / Restrictions Precautions Precautions: Back Precaution Booklet Issued: Yes (comment) Precaution Comments: no bending, lifting, twisting, arching Restrictions Weight Bearing Restrictions: No      Mobility Bed Mobility Overal bed mobility: Needs Assistance Bed Mobility: Rolling;Sidelying to Sit Rolling: Min guard Sidelying to sit: Min guard       General bed mobility comments: cues for log roll technique  Transfers Overall transfer level: Needs assistance Equipment used: None Transfers: Sit to/from Stand Sit to Stand: Min guard         General transfer comment: cues for hand/foot placement to improve COM over BOS    Balance Overall balance assessment: Needs assistance Sitting-balance support: No upper extremity supported;Feet supported Sitting balance-Leahy Scale: Good     Standing balance support: No upper extremity supported Standing balance-Leahy Scale: Fair                             ADL either performed or assessed with clinical judgement   ADL Overall ADL's : Needs assistance/impaired                                       General ADL Comments: Supervision-CGA for LB dressing from sit to stand position, CGA for functional transfers, will require assist for compression stockings, pt reports family can assist     Vision Baseline Vision/History: No visual deficits Patient Visual Report: No change from baseline Vision Assessment?: No apparent visual deficits  Perception     Praxis      Pertinent Vitals/Pain Pain Assessment: 0-10 Pain Score: 4  Pain Location: lumbar back Pain Descriptors / Indicators: Aching Pain Intervention(s): Limited activity within patient's tolerance;Monitored during session;Repositioned     Hand Dominance Right   Extremity/Trunk Assessment Upper Extremity Assessment Upper  Extremity Assessment: RUE deficits/detail(LUE WFL) RUE Deficits / Details: shoulder ROM approx 50 degrees shoulder flexion, adduction, extension and pain with end range movement 2/2 old shoulder injury/surgery; grip and elbow flex/ext [facility]   Lower Extremity Assessment Lower Extremity Assessment: Defer to PT evaluation;Overall [facility] for tasks assessed   Cervical / Trunk Assessment Cervical / Trunk Assessment: Other exceptions Cervical / Trunk Exceptions: hx cervical fusion per pt and now s/p lumbar surgery   Communication Communication Communication: No difficulties   Cognition Arousal/Alertness: Awake/alert Behavior During Therapy: WFL for tasks assessed/performed Overall Cognitive Status: Within Functional Limits for tasks assessed                                     General Comments  drain in place    Exercises Other Exercises Other Exercises: Pt instructed in back precautions, AE/DME for ADL, falls prevention, pet care considerations, compression stocking mgt, and home/routines modifications to maximize safety/indep and adherence to back precautions; handout provided to support recall and carryover   Shoulder Instructions      Home Living Family/patient expects to be discharged to:: Private residence Living Arrangements: Spouse/significant other;Children;Other relatives(daughter, 3 grandkids, dogs) Available Help at Discharge: Family;Available 24 hours/day Type of Home: House Home Access: Stairs to enter [employer] of Steps: 8 Entrance Stairs-Rails: Right Home Layout: One level     Bathroom Shower/Tub: Occupational psychologist: Handicapped height     Home Equipment: Cane - single point;Crutches;Hand held shower head;Adaptive equipment Adaptive Equipment: Reacher;Long-handled sponge        Prior Functioning/Environment Level of Independence: Independent        Comments: Pt indep with mobility although endorses a "slumped" posture  when walking 2/2 back pain; modified indep with ADL 2/2 decreased R shoulder strength/ROM from previous shoulder surgeries        OT Problem List: Decreased strength;Pain;Impaired balance (sitting and/or standing);Decreased range of motion;Impaired UE functional use      OT Treatment/Interventions:      OT Goals(Current goals can be found in the care plan section) Acute Rehab OT Goals Patient Stated Goal: go home and recover OT Goal Formulation: All assessment and education complete, DC therapy  OT Frequency:     Barriers to D/C:            Co-evaluation              AM-PAC OT "6 Clicks" Daily Activity     Outcome Measure Help from another person eating meals?: None Help from another person taking care of personal grooming?: None Help from another person toileting, which includes using toliet, bedpan, or urinal?: A Little Help from another person bathing (including washing, rinsing, drying)?: A Little Help from another person to put on and taking off regular upper body clothing?: None Help from another person to put on and taking off regular lower body clothing?: A Little 6 Click Score: 21   End of Session Equipment Utilized During Treatment: Gait belt  Activity Tolerance: Patient tolerated treatment well Patient left: in chair;with call bell/phone within reach;with chair alarm set;with SCD's reapplied  OT  Visit Diagnosis: Other abnormalities of gait and mobility (R26.89)                Time: [id number] OT Time Calculation (min): 33 min Charges:  OT General Charges $OT Visit: 1 Visit OT Evaluation $OT Eval Low Complexity: 1 Low OT Treatments $Self Care/Home Management : 23-37 mins  Jeni Salles, MPH, MS, OTR/L ascom [phone] 09/02/19, 11:42 AM

## 2019-09-02 NOTE — TOC Initial Note (Signed)
Transition of Care [facility]) - Initial/Assessment Note    Patient Details  Name: [name] MRN: [id number] Date of Birth: Aug 22, 1956  [facility] CM/SW Contact:    Su Hilt, RN Phone Number: 09/02/2019, 3:27 PM  Clinical Narrative:                 Met with the patient to discuss DC plan and needs The 0patient lives at home with his spouse and daughter and 3 grand children, has farm animals he cares for as well He has crutches and a cane at home and does not need additional DME He is not recommended to have or need HH No needs at this time Case manager signing off  Expected Discharge Plan: Home/Self Care Barriers to Discharge: Continued Medical Work up   Patient Goals and CMS Choice Patient states their goals for this hospitalization and ongoing recovery are:: go home      Expected Discharge Plan and Services Expected Discharge Plan: Home/Self Care   Discharge Planning Services: CM Consult                     DME Arranged: N/A           HH Agency: NA        Prior Living Arrangements/Services   Lives with:: Adult Children, Relatives, Spouse Patient language and need for interpreter reviewed:: Yes Do you feel safe going back to the place where you live?: Yes      Need for Family Participation in Patient Care: No (Comment) Care giver support system in place?: Yes (comment) Current home services: DME(crutches and cane) Criminal Activity/Legal Involvement Pertinent to Current Situation/Hospitalization: No - Comment as needed  Activities of Daily Living Home Assistive Devices/Equipment: None ADL Screening (condition at time of admission) Patient's cognitive ability adequate to safely complete daily activities?: Yes Is the patient deaf or have difficulty hearing?: No Does the patient have difficulty seeing, even when wearing glasses/contacts?: No Does the patient have difficulty concentrating, remembering, or making decisions?:  No Patient able to express need for assistance with ADLs?: Yes Does the patient have difficulty dressing or bathing?: No Independently performs ADLs?: Yes (appropriate for developmental age) Does the patient have difficulty walking or climbing stairs?: No Weakness of Legs: Both Weakness of Arms/Hands: Both  Permission Sought/Granted   Permission granted to share information with : Yes, Verbal Permission Granted              Emotional Assessment Appearance:: Appears stated age Attitude/Demeanor/Rapport: Engaged Affect (typically observed): Appropriate, Calm Orientation: : Oriented to Self, Oriented to Place, Oriented to  Time, Oriented to Situation Alcohol / Substance Use: Not Applicable Psych Involvement: No (comment)  Admission diagnosis:  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 S/P lumbar fusion 09/01/2019  . CORONARY ARTERY DISEASE 07/24/2007  . NICOTINE ADDICTION 07/23/2007  . HYPERTENSION 07/23/2007  . DENTAL CARIES 07/23/2007  . GERD 07/23/2007  . ROTATOR CUFF INJURY, LEFT SHOULDER 07/23/2007   PCP:  Cyndi Bender, PA-C Pharmacy:   [facility] [address] [place] [address] [place] [address] Phone: [phone] Fax: [phone]     Social Determinants of Health (SDOH) Interventions    Readmission Risk Interventions No flowsheet data found.

## 2019-09-02 NOTE — Evaluation (Signed)
Physical Therapy Evaluation Patient Details Name: [name] MRN: [id number] DOB: September 17, 1956 Today's Date: 09/02/2019   History of Present Illness  63 yo male s/p L5-S1 TLIF and L2-5 decompression on 09/01/19.  Clinical Impression  Pt is a pleasant 63 year old M who was admitted s/p L5-S1 TLIF and L2-5 decompression. Upon entry, pt is in minimal pain; reports 4/10 on NPS and is able to recall and verbalize precautions. Pt performs bed mobility, transfers, and ambulation with CGA/supervision and the use of RW and transition to [facility]. Pt was also able to ascend/descend stairs safely with CGA and use of rail. Pt demonstrates deficits with strength/mobility/ROM. Pt will benefit from skilled PT to address above deficits; current follow-up care recommendation is HHPT.      Follow Up Recommendations Outpatient PT    Equipment Recommendations  None recommended by PT    Recommendations for Other Services       Precautions / Restrictions Precautions Precautions: Back Precaution Booklet Issued: Yes (comment) Precaution Comments: no bending, lifting, twisting, arching Restrictions Weight Bearing Restrictions: No      Mobility  Bed Mobility Overal bed mobility: Needs Assistance Bed Mobility: Rolling;Sidelying to Sit Rolling: Min guard Sidelying to sit: Min guard       General bed mobility comments: Pt in recliner upon entry/exit; reports to be comfortable with log rolling  Transfers Overall transfer level: Needs assistance Equipment used: Rolling walker (2 wheeled) Transfers: Sit to/from Stand Sit to Stand: Min guard         General transfer comment: Pt able to perform sit<> stand with CGA safely  Ambulation/Gait Ambulation/Gait assistance: Min guard Gait Distance (Feet): 200 Feet Assistive device: Straight cane Gait Pattern/deviations: Decreased step length - right;Decreased stance time - right;Antalgic     General Gait Details: Pt begin amb with RW for assessment and  was able to perform safely without difficulty. For therex, pt amb using SPC. Pt displays slightly antalgic gait pattern 2/2 pain in R LE. Pt instruction on use of SPC in L hand and advancing with R foot to assist R LE.  Stairs Stairs: Yes Stairs assistance: Supervision Stair Management: One rail Right Number of Stairs: 4(twice) General stair comments: Pt able to perform stairs safely with step-to pattern and use of R side rail. Min guard provided and PT demonstration prior to attempt  Wheelchair Mobility    Modified Rankin (Stroke Patients Only)       Balance Overall balance assessment: Needs assistance Sitting-balance support: No upper extremity supported;Feet supported Sitting balance-Leahy Scale: Good     Standing balance support: No upper extremity supported Standing balance-Leahy Scale: Good Standing balance comment: Pt has good standing balance; able to perform amb with use of SPC.                              Pertinent Vitals/Pain Pain Assessment: 0-10 Pain Score: 4  Pain Location: lumbar back Pain Descriptors / Indicators: Aching Pain Intervention(s): Limited activity within patient's tolerance;Repositioned;Monitored during session    Home Living Family/patient expects to be discharged to:: Private residence Living Arrangements: Spouse/significant other;Children;Other relatives Available Help at Discharge: Family;Available 24 hours/day Type of Home: House Home Access: Stairs to enter Entrance Stairs-Rails: Right Entrance Stairs-Number of Steps: 8 Home Layout: One level Home Equipment: Cane - single point;Crutches Additional Comments: Pt amb independently w/o assistive device    Prior Function Level of Independence: Independent         Comments:  From OP: "Pt indep with mobility although endorses a "slumped" posture when walking 2/2 back pain; modified indep with ADL 2/2 decreased R shoulder strength/ROM from previous shoulder surgeries"      Hand Dominance   Dominant Hand: Right    Extremity/Trunk Assessment   Upper Extremity Assessment Upper Extremity Assessment: Overall WFL for tasks assessed RUE Deficits / Details: shoulder ROM approx 50 degrees shoulder flexion, adduction, extension and pain with end range movement 2/2 old shoulder injury/surgery; grip and elbow flex/ext WFL    Lower Extremity Assessment Lower Extremity Assessment: Overall WFL for tasks assessed    Cervical / Trunk Assessment Cervical / Trunk Assessment: Kyphotic Cervical / Trunk Exceptions: hx cervical fusion per pt and now s/p lumbar surgery  Communication   Communication: No difficulties  Cognition Arousal/Alertness: Awake/alert Behavior During Therapy: WFL for tasks assessed/performed Overall Cognitive Status: Within Functional Limits for tasks assessed                                        General Comments General comments (skin integrity, edema, etc.): drain in place upon entry/exit    Exercises Other Exercises Other Exercises: Pt able to repeat back precautions. Other Exercises: Seated in recliner, 10 reps, bilateral: LAQ, QS, GS, hip abd/add; PT supervision Other Exercises: 5x STS time27 seconds Other Exercises: Pt amb with SPC; instructed to hold in L UE and advance forward with R LE for ideal weight distribution.   Assessment/Plan    PT Assessment Patient needs continued PT services  PT Problem List Decreased strength;Decreased balance;Decreased activity tolerance;Decreased mobility;Pain       PT Treatment Interventions Gait training;Stair training;Functional mobility training;Therapeutic activities;Therapeutic exercise;Balance training;Patient/family education;DME instruction    PT Goals (Current goals can be found in the Care Plan section)  Acute Rehab PT Goals Patient Stated Goal: go home and recover PT Goal Formulation: With patient Time For Goal Achievement: 09/16/19 Potential to Achieve Goals:  Good    Frequency 7X/week   Barriers to discharge        Co-evaluation               AM-PAC PT "6 Clicks" Mobility  Outcome Measure Help needed turning from your back to your side while in a flat bed without using bedrails?: A Little Help needed moving from lying on your back to sitting on the side of a flat bed without using bedrails?: A Little Help needed moving to and from a bed to a chair (including a wheelchair)?: A Little Help needed standing up from a chair using your arms (e.g., wheelchair or bedside chair)?: A Little Help needed to walk in hospital room?: A Little Help needed climbing 3-5 steps with a railing? : A Little 6 Click Score: 18    End of Session Equipment Utilized During Treatment: Gait belt Activity Tolerance: Patient tolerated treatment well Patient left: in chair;with call bell/phone within reach;with chair alarm set;with SCD's reapplied Nurse Communication: Mobility status PT Visit Diagnosis: Unsteadiness on feet (R26.81);Muscle weakness (generalized) (M62.81);Pain Pain - Right/Left: Right Pain - part of body: Leg    Time: [id number] PT Time Calculation (min) (ACUTE ONLY): 31 min   Charges:   PT Evaluation $PT Eval Low Complexity: 1 Low PT Treatments $Gait Training: 8-22 mins        Mallorie Norrod, SPT   Masha Orbach 09/02/2019, 3:03 PM

## 2019-09-02 NOTE — Discharge Instructions (Signed)
NEUROSURGERY DISCHARGE INSTRUCTIONS  The following are instructions to help in your recovery once you have been discharged from the hospital. Even if you feel well, it is important that you follow these activity guidelines.  What to do after you leave the hospital:  Recommended diet:  Increase protein intake to promote wound healing. You may return to your usual diet. However, you may experience discomfort when swallowing in the first month after your surgery. This is normal. You may find that softer foods are more comfortable for you to swallow. Be sure to stay hydrated.   Recommended activity: No bending, lifting, or twisting (BLT). Avoid lifting objects heavier than 10 pounds (gallon milk jug). Where possible, avoid household activities that involve lifting, bending, reaching, pushing, or pulling such as laundry, vacuuming, grocery shopping, and childcare. Try to arrange for help from friends and family for these activities while you heal.   Increase physical activity slowly as tolerated. Taking short walks is encouraged, but avoid strenuous exercise. Do not jog, run, bicycle, lift weights, or participate in any other exercises unless specifically allowed by your doctor.   You should not drive until cleared by your doctor.   Until released by your doctor, you should not return to work or school. You should rest at home and let your body heal.   You may shower the day after your surgery. After showering, lightly dab your incision dry. Do not take a tub bath or go swimming until approved by your doctor at your follow-up appointment.   If you smoke, we strongly recommend that you quit. Smoking has been proven to interfere with normal bone healing and will dramatically reduce the success rate of your surgery. Please contact [facility] (800-QUIT-NOW) and use the resources at www.[facility].com for assistance in stopping smoking.   Medications  Do not restart Aspirin Or Omega 3 supplement until  seven days after surgery  * Do not take anti-inflammatory medications for 3 MONTHS after surgery (naproxen [Aleve], ibuprofen [Advil, Motrin], etc.).   You may restart home medications.   Wound Care Instructions  If you have a dressing on your incision, remove it two days after your surgery. Keep your incision area clean and dry.   If you have staples or stitches on your incision, you should have a follow up scheduled for removal. If you do not have staples or stitches, you will have steri-strips (small pieces of surgical tape) or Dermabond glue. The steri-strips/glue should begin to peel away within about a week (it is fine if the steri-strips fall off before then). If the strips are still in place one week after your surgery, you may gently remove them.    Please Report any of the following: Should you experience any of the following, contact us immediately:   New numbness or weakness   Pain that is progressively getting worse, and is not relieved by your pain medication, muscle relaxers, rest, and warm compresses   Bleeding, redness, swelling, pain, or drainage from surgical incision   Chills or flu-like symptoms   Fever greater than 101.0 F (38.3 C)   Inability to eat, drink fluids, or take medications   Problems with bowel or bladder functions   Difficulty breathing or shortness of breath   Warmth, tenderness, or swelling in your calf    Additional Follow up appointments During office hours (Monday-Friday 9 am to 5 pm), please call your physician at [phone] and ask for Berdine Addison.   After hours and weekends, please call the [facility]  Operator at [phone] and ask for the Neurosurgery Resident On Call   For a life-threatening emergency, call 911

## 2019-09-02 NOTE — Progress Notes (Signed)
Inpatient Diabetes Program Recommendations  AACE/ADA: New Consensus Statement on Inpatient Glycemic Control (2015)  Target Ranges:  Prepandial:   less than 140 mg/dL      Peak postprandial:   less than 180 mg/dL (1-2 hours)      Critically ill patients:  140 - 180 mg/dL   Lab Results  Component Value Date   GLUCAP 133 (H) 09/02/2019    Review of Glycemic Control Results for [name], [name] (MRN [id number]) as of 09/02/2019 13:04  Ref. Range 09/01/2019 18:22 09/01/2019 20:07 09/02/2019 08:49 09/02/2019 12:19 09/02/2019 12:41  Glucose-Capillary Latest Ref Range: 70 - 99 mg/dL 175 (H) 176 (H) 90 27 (LL) 133 (H)   Diabetes history: No hx noted Current orders for Inpatient glycemic control: Novolog moderate correction tid  Inpatient Diabetes Program Recommendations:   Patient's CBG decreased to 27 today without insulin. -Decrease Novolog correction to sensitive or Discontinue for now and reevaluate need  Thank you, Bethena Roys E. Srija Southard, RN, MSN, CDE  Diabetes Coordinator Inpatient Glycemic Control Team Team Pager [phone] (8am-5pm) 09/02/2019 1:08 PM

## 2019-09-02 NOTE — Progress Notes (Signed)
Procedure: L5-S1 TLIF, L2-5 Procedure date: 09/01/2019 Diagnosis: Neurogenic claudication  History: [name] is s/p L5-S1 TLIF and L2-5 decompression  POD1: He is recovering well. Nausea/vomiting has improved. Awaiting breakfast to see how well he can tolerate food. Complains of back pain. Lower extremity pain that was present prior to surgery has resolved. Denies any new numbness/tingling.  He has not ambulated yet. Drain output overnight 50.    POD0: Tolerated procedure well.  Evaluated in postop recovery still disoriented from anesthesia.  Complains of significant back pain at this time.  Physical Exam: Vitals:   09/02/19 0429 09/02/19 0828  BP: 134/77 (!) 142/89  Pulse: (!) 56 (!) 57  Resp: 19 16  Temp: 97.6 F (36.4 C) 97.6 F (36.4 C)  SpO2: 98% 96%   Strength: 5/5 throughout lower extremities.  Skin: Scant blood drainage from lumbar incision sites.  No active bleeding from drain insertion site.   Data:  No results for input(s): NA, K, CL, CO2, BUN, CREATININE, LABGLOM, GLUCOSE, CALCIUM in the last 168 hours. No results for input(s): AST, ALT, ALKPHOS in the last 168 hours.  Invalid input(s): TBILI   No results for input(s): WBC, HGB, HCT, PLT in the last 168 hours. Recent Labs  Lab 08/30/19 0819  APTT 34  INR 1.0         Other tests/results: Lumbar xray pending  Assessment/Plan:  [name] is POD1 s/p L5-S1 TLIF, L2-5 decompression. Will continue to monitor.   - monitor drain output - mobilize - pain control - DVT prophylaxis - PTOT  Marin Olp PA-C Department of Neurosurgery

## 2019-09-03 ENCOUNTER — Encounter: Payer: Self-pay | Admitting: Neurosurgery

## 2019-09-06 ENCOUNTER — Encounter: Payer: Self-pay | Admitting: Neurosurgery

## 2019-09-08 ENCOUNTER — Encounter: Payer: Self-pay | Admitting: Neurosurgery

## 2019-11-28 DIAGNOSIS — I255 Ischemic cardiomyopathy: Secondary | ICD-10-CM | POA: Insufficient documentation

## 2020-05-12 ENCOUNTER — Other Ambulatory Visit: Payer: Self-pay | Admitting: Neurosurgery

## 2020-05-12 DIAGNOSIS — Z981 Arthrodesis status: Secondary | ICD-10-CM

## 2020-06-13 ENCOUNTER — Other Ambulatory Visit: Payer: Self-pay | Admitting: Orthopedic Surgery

## 2020-06-13 DIAGNOSIS — M75122 Complete rotator cuff tear or rupture of left shoulder, not specified as traumatic: Secondary | ICD-10-CM

## 2020-06-28 ENCOUNTER — Other Ambulatory Visit: Payer: Self-pay

## 2020-06-28 ENCOUNTER — Ambulatory Visit
Admission: RE | Admit: 2020-06-28 | Discharge: 2020-06-28 | Disposition: A | Discharge status: Home / Self Care | Payer: Medicare Other | Source: Ambulatory Visit | Attending: Orthopedic Surgery | Admitting: Orthopedic Surgery

## 2020-06-28 DIAGNOSIS — M75122 Complete rotator cuff tear or rupture of left shoulder, not specified as traumatic: Secondary | ICD-10-CM | POA: Insufficient documentation

## 2020-08-07 ENCOUNTER — Other Ambulatory Visit: Payer: Self-pay

## 2020-08-07 ENCOUNTER — Ambulatory Visit
Admission: RE | Admit: 2020-08-07 | Discharge: 2020-08-07 | Disposition: A | Discharge status: Home / Self Care | Payer: Medicare Other | Source: Ambulatory Visit | Attending: Neurosurgery | Admitting: Neurosurgery

## 2020-08-07 DIAGNOSIS — Z981 Arthrodesis status: Secondary | ICD-10-CM | POA: Diagnosis not present

## 2020-08-08 ENCOUNTER — Ambulatory Visit: Payer: Medicare Other | Admitting: Student in an Organized Health Care Education/Training Program

## 2020-09-28 ENCOUNTER — Ambulatory Visit
Payer: Medicare Other | Attending: Student in an Organized Health Care Education/Training Program | Admitting: Student in an Organized Health Care Education/Training Program

## 2020-10-23 IMAGING — CT CT L SPINE W/O CM
3 series · 14 of 33 positions shown, 17 images · non-contrast
Comparison: Lumbar MRI 06/21/2019

CLINICAL DATA: Follow-up lumbar spine surgery August 2019

EXAM:
CT LUMBAR SPINE WITHOUT CONTRAST
TECHNIQUE: Multidetector CT imaging of the lumbar spine was performed without
intravenous contrast administration. Multiplanar CT image
reconstructions were also generated.

[Series 4: l spine soft (person_name) · axial · 0.33mm/px · z∈[-363,-175]mm · 6 of 123 slices shown, 8 images]
[im 19/123  soft-tissue]
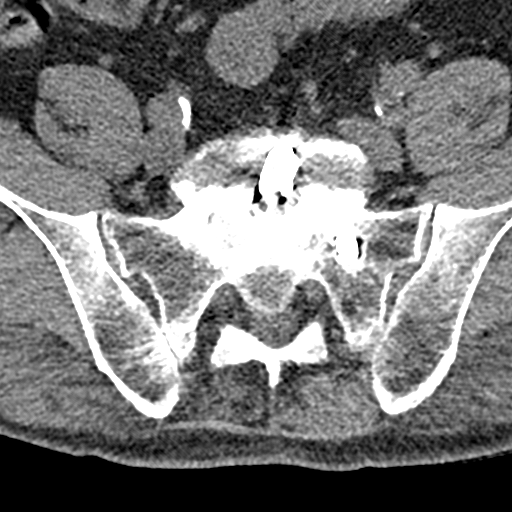
[im 19/123  bone]
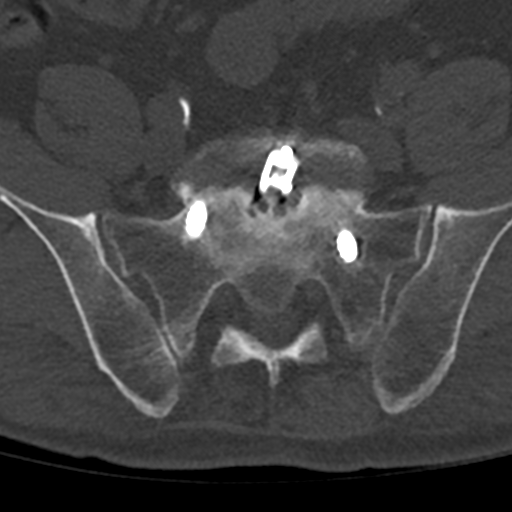
[im 38/123  bone]
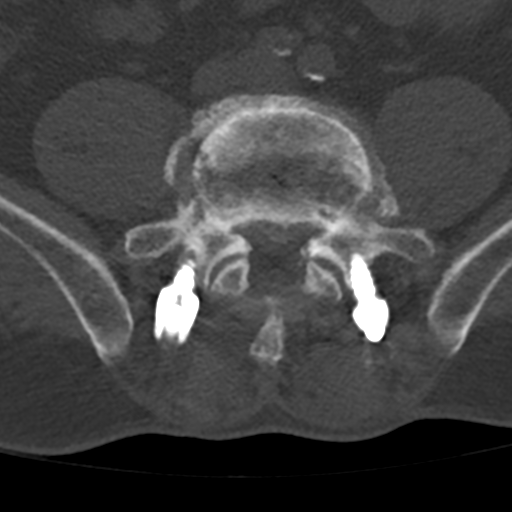
[im 57/123  bone]
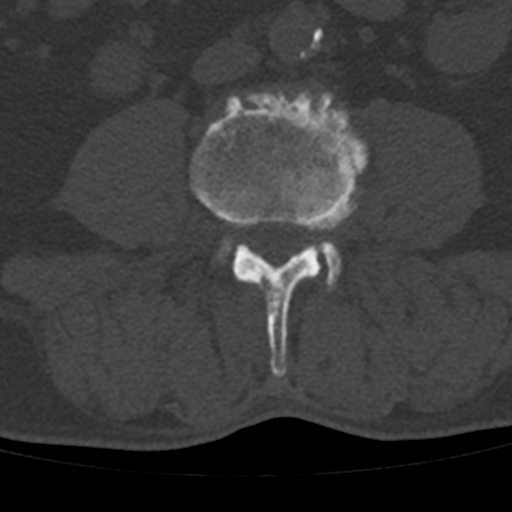
[im 76/123  bone]
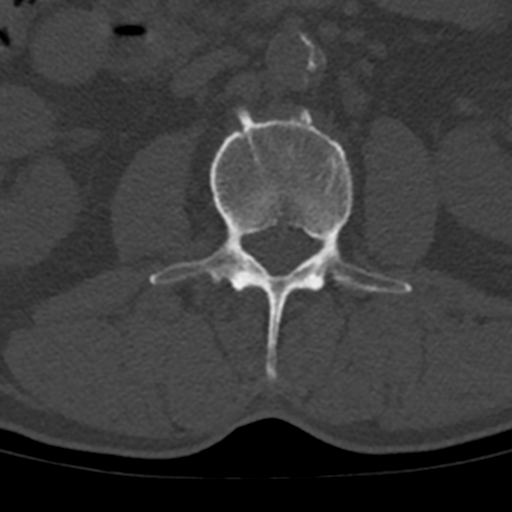
[im 94/123  soft-tissue]
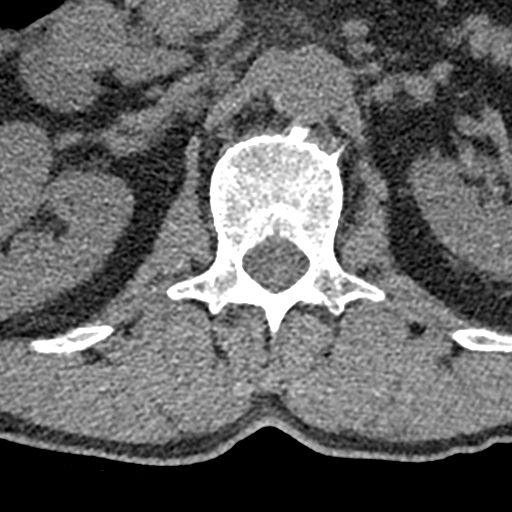
[im 94/123  bone]
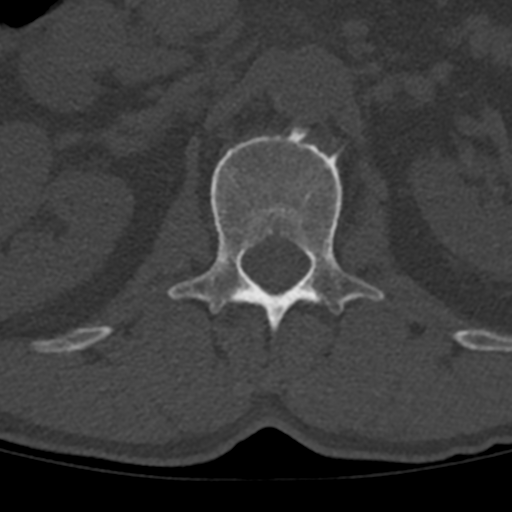
[im 113/123  bone]
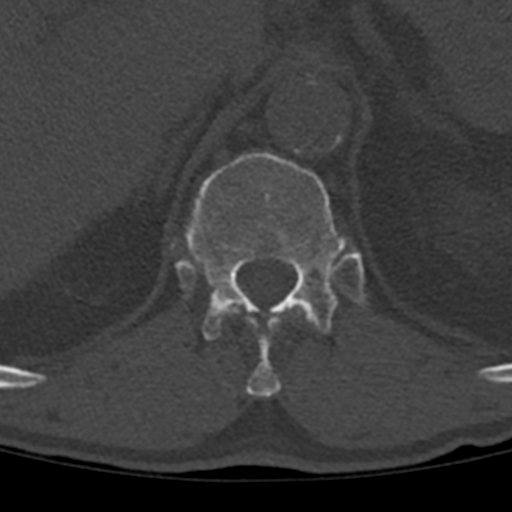

[Series 7: sagittal bone · sagittal · 0.40mm/px · 5 of 100 slices shown, 6 images]
[im 34/100  bone]
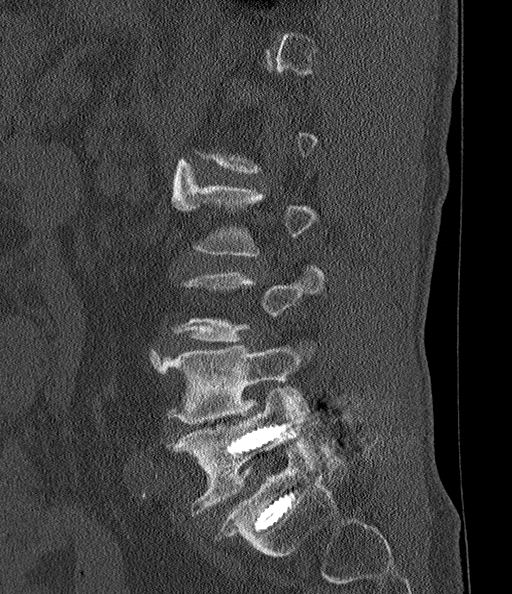
[im 42/100  bone]
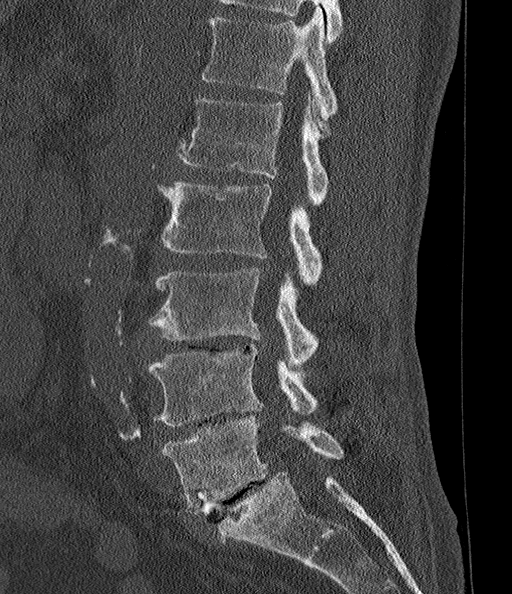
[im 50/100  soft-tissue]
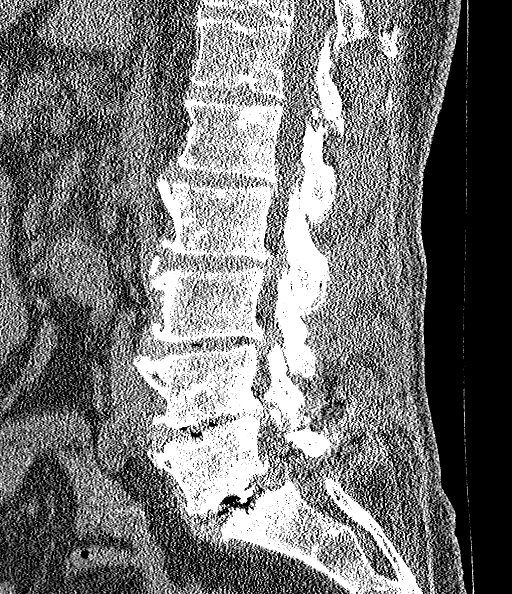
[im 50/100  bone]
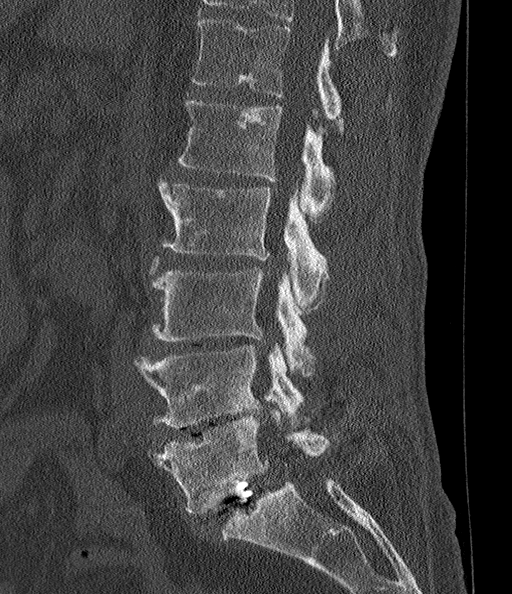
[im 58/100  bone]
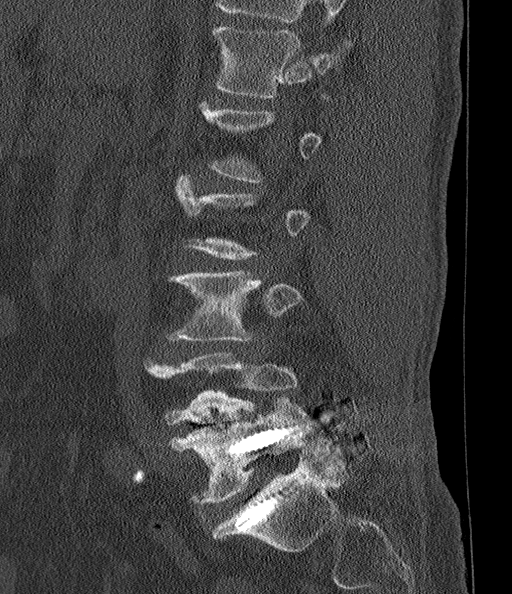
[im 67/100  bone]
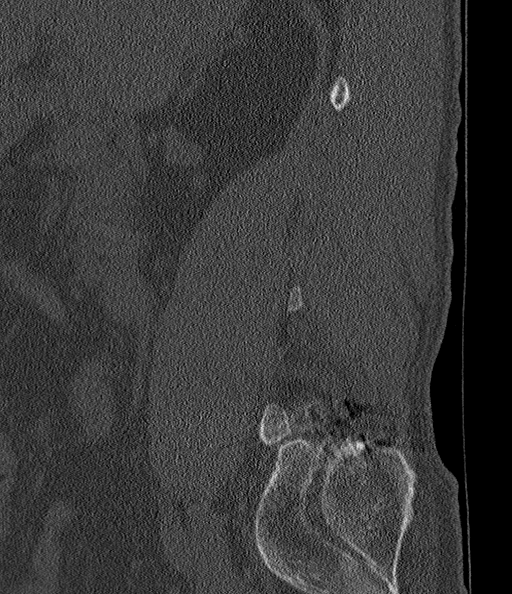

[Series 8: coronal bone · coronal · 0.37mm/px · 3 of 86 slices shown]
[im 18/86  bone]
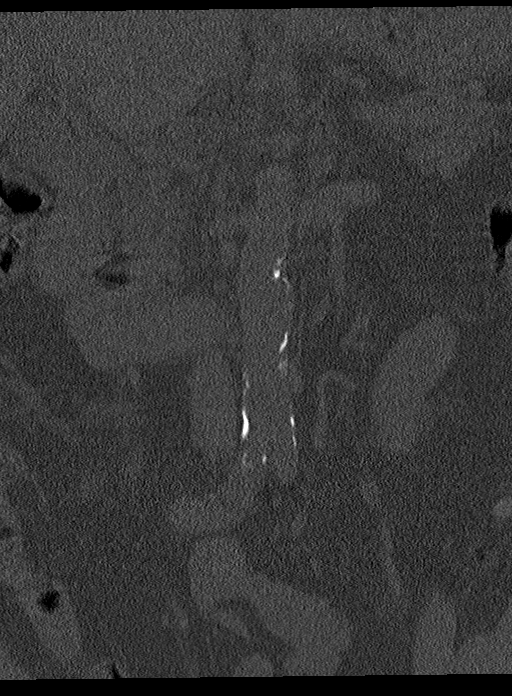
[im 35/86  bone]
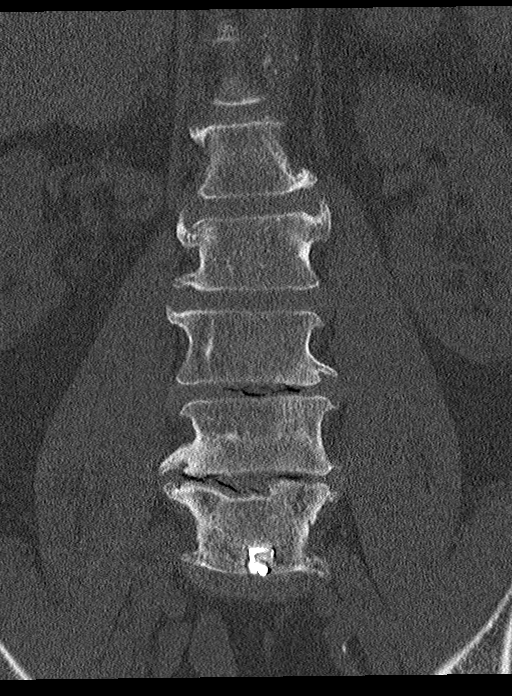
[im 52/86  bone]
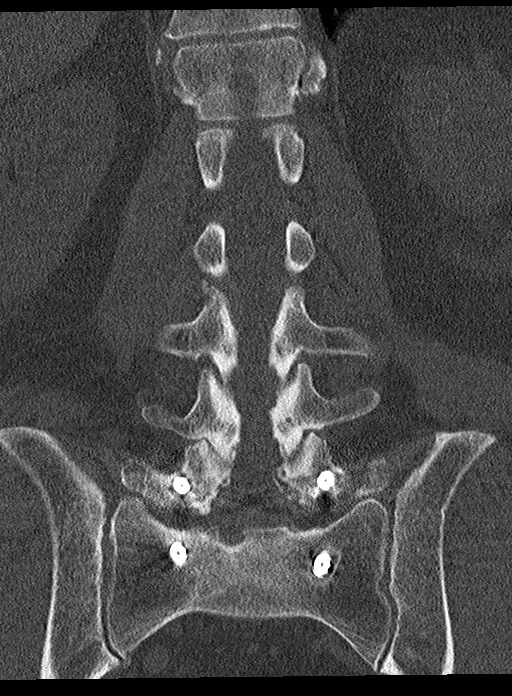

[14 of 33 positions shown; findings below may reference images not displayed]

FINDINGS: Segmentation: Standard lumbar numbering

Alignment: Grade 1 anterolisthesis at L5-S1.

Vertebrae: L5-S1 PLIF. No incorporation of the intervertebral graft
with extensive adjacent vacuum phenomenon. There are rod and pedicle
screws on both sides with loosening best seen on the left at S1.
Chronic bilateral L5 pars defects. No evidence of fracture or bone
lesion.

Paraspinal and other soft tissues: Negative

Disc levels:

T12- L1: Narrow disc and mild degenerative facet spurring. No
visible impingement

L1-L2: Disc narrowing and bulging with mild facet spurring. No
visible impingement

L2-L3: Disc narrowing and bulging. Degenerative facet spurring.
There is potentially moderate foraminal narrowing on both sides.
Right laminotomy.

L3-L4: Disc narrowing and endplate degeneration with disc bulging.
Degenerative facet spurring on both sides. Right laminotomy.
Probable high-grade left foramina impingement.

L4-L5: Disc narrowing and endplate degeneration with ridging and
bulging. Degenerative facet spurring on both sides. There has been a
laminotomy. Right more than left foraminal impingement.

L5-S1:Pseudoarthrosis findings noted above. Biforaminal impingement
is improved but still present to a degree.
IMPRESSION: 1. Pseudoarthrosis findings at L5-S1.
2. L2-3 to L4-5 laminotomy.
3. Foraminal narrowings most notable on the left at L3-4 and
bilaterally at L4-5, L5-S1.

## 2020-10-28 ENCOUNTER — Emergency Department: Payer: Medicare Other

## 2020-10-28 ENCOUNTER — Encounter: Payer: Self-pay | Admitting: Emergency Medicine

## 2020-10-28 ENCOUNTER — Other Ambulatory Visit: Payer: Self-pay

## 2020-10-28 ENCOUNTER — Emergency Department
Admission: EM | Admit: 2020-10-28 | Discharge: 2020-10-28 | Disposition: A | Discharge status: Home / Self Care | Payer: Medicare Other | Attending: Emergency Medicine | Admitting: Emergency Medicine

## 2020-10-28 DIAGNOSIS — M79601 Pain in right arm: Secondary | ICD-10-CM | POA: Diagnosis not present

## 2020-10-28 DIAGNOSIS — M546 Pain in thoracic spine: Secondary | ICD-10-CM | POA: Diagnosis not present

## 2020-10-28 DIAGNOSIS — W19XXXA Unspecified fall, initial encounter: Secondary | ICD-10-CM | POA: Insufficient documentation

## 2020-10-28 DIAGNOSIS — Z5321 Procedure and treatment not carried out due to patient leaving prior to being seen by health care provider: Secondary | ICD-10-CM | POA: Insufficient documentation

## 2020-10-28 DIAGNOSIS — M542 Cervicalgia: Secondary | ICD-10-CM | POA: Insufficient documentation

## 2020-10-28 DIAGNOSIS — S0990XA Unspecified injury of head, initial encounter: Secondary | ICD-10-CM | POA: Diagnosis not present

## 2020-10-28 NOTE — ED Triage Notes (Addendum)
Pt to ED via POV with c/o fall. Pt states was knocked off deck approx 4 feet landing on his back. Pt c/o neck pain, R arm pain, and mid upper back pain. Pt denies LOC at this time. Pt states initially felt like he "had no arms".   No worsening tenderness with palpation to C-Spine.   C-Collar applied by this RN in triage.

## 2020-10-28 NOTE — ED Notes (Signed)
Pt reports he does not want to wait any longer, will go home, still has C-collar on at discharge.

## 2020-10-28 NOTE — ED Notes (Signed)
This RN discussed patient care with Dr. Archie Balboa, [facility] for imaging only at this time.

## 2020-10-29 ENCOUNTER — Other Ambulatory Visit: Payer: Self-pay

## 2020-10-29 ENCOUNTER — Encounter: Payer: Self-pay | Admitting: Emergency Medicine

## 2020-10-29 ENCOUNTER — Emergency Department
Admission: EM | Admit: 2020-10-29 | Discharge: 2020-10-29 | Disposition: A | Discharge status: Home / Self Care | Payer: Medicare Other | Attending: Emergency Medicine | Admitting: Emergency Medicine

## 2020-10-29 ENCOUNTER — Emergency Department: Payer: Medicare Other

## 2020-10-29 DIAGNOSIS — S199XXA Unspecified injury of neck, initial encounter: Secondary | ICD-10-CM | POA: Diagnosis present

## 2020-10-29 DIAGNOSIS — Z87891 Personal history of nicotine dependence: Secondary | ICD-10-CM | POA: Diagnosis not present

## 2020-10-29 DIAGNOSIS — I10 Essential (primary) hypertension: Secondary | ICD-10-CM | POA: Insufficient documentation

## 2020-10-29 DIAGNOSIS — Y33XXXA Other specified events, undetermined intent, initial encounter: Secondary | ICD-10-CM | POA: Diagnosis not present

## 2020-10-29 DIAGNOSIS — I2510 Atherosclerotic heart disease of native coronary artery without angina pectoris: Secondary | ICD-10-CM | POA: Insufficient documentation

## 2020-10-29 DIAGNOSIS — S129XXA Fracture of neck, unspecified, initial encounter: Secondary | ICD-10-CM | POA: Insufficient documentation

## 2020-10-29 DIAGNOSIS — E119 Type 2 diabetes mellitus without complications: Secondary | ICD-10-CM | POA: Insufficient documentation

## 2020-10-29 DIAGNOSIS — Z79899 Other long term (current) drug therapy: Secondary | ICD-10-CM | POA: Diagnosis not present

## 2020-10-29 MED ORDER — CYCLOBENZAPRINE HCL 10 MG PO TABS
5.0000 mg | ORAL_TABLET | Freq: Once | ORAL | Status: AC
  Administered 2020-10-29: 5 mg via ORAL
  Filled 2020-10-29: qty 1

## 2020-10-29 MED ORDER — OXYCODONE-ACETAMINOPHEN 5-325 MG PO TABS
1.0000 | ORAL_TABLET | Freq: Once | ORAL | Status: AC
  Administered 2020-10-29: 1 via ORAL
  Filled 2020-10-29: qty 1

## 2020-10-29 MED ORDER — OXYCODONE-ACETAMINOPHEN 5-325 MG PO TABS
1.0000 | ORAL_TABLET | Freq: Three times a day (TID) | ORAL | 0 refills | Status: AC | PRN
Start: 2020-10-29 — End: 2020-11-03

## 2020-10-29 MED ORDER — LIDOCAINE 5 % EX PTCH
1.0000 | MEDICATED_PATCH | CUTANEOUS | Status: DC
  Administered 2020-10-29: 1 via TRANSDERMAL
  Filled 2020-10-29: qty 1

## 2020-10-29 NOTE — ED Triage Notes (Signed)
Pt reports fell 4 feet yesterday and has some neck and back pain. Pt reports was here last pm for the same and was told the wait was long so he left. Pt reports continued pain that is worse. Pt with cervical collar in place.

## 2020-10-29 NOTE — ED Provider Notes (Signed)
[facility] Emergency Department Provider Note  ____________________________________________   First MD Initiated Contact with Patient 10/29/20 1118     (approximate)  I have reviewed the triage vital signs and the nursing notes.   HISTORY  Chief Complaint Neck Injury and Fall   HPI [name] is a 64 y.o. male with a past medical history of HTN, HDL, DM, CAD, and arthritis who presents for assessment of neck pain and bilateral shoulder pain as well as some weakness and paresthesias in his right arm.  Patient notes he was here last night to be seen but left prior to being evaluated because of long wait times.  He states that prior to presenting to the ED yesterday he was working on a porch and lost his balance falling backwards between a blush and wall.  He did not have LOC and is not anticoagulated.  States he felt immediate pain in his neck and bilateral shoulders and mid back with some weakness and paresthesias in his right arm.  He denies any other acute pain including in his chest, lower back, abdomen, left upper extremity or bilateral lower extremities.  He states he is otherwise been in his usual state of health without any recent fevers, chills, cough, nausea, vomiting, diarrhea, dysuria, rash, chest pain, abdominal pain or other recent falls or injuries.  Denies any other acute concerns at this time.         Past Medical History:  Diagnosis Date  . Anginal pain ([place])   . Arthritis   . Bronchitis    h/o  . Coronary artery disease   . Depression   . Diabetes mellitus without complication (HCC)    diet controlled  . Dyspnea    with exertion  . GERD (gastroesophageal reflux disease)   . History of MRSA infection 2010  . Hyperlipidemia   . Hypertension   . Insomnia   . Peripheral vascular disease ([place])   . Stomach ulcer     Patient Active Problem List   Diagnosis Date Noted  . S/P lumbar fusion 09/01/2019  . CORONARY ARTERY DISEASE  07/24/2007  . NICOTINE ADDICTION 07/23/2007  . HYPERTENSION 07/23/2007  . DENTAL CARIES 07/23/2007  . GERD 07/23/2007  . ROTATOR CUFF INJURY, LEFT SHOULDER 07/23/2007    Past Surgical History:  Procedure Laterality Date  . ANTERIOR CERVICAL DECOMP/DISCECTOMY FUSION N/A 01/28/2018   Procedure: ANTERIOR CERVICAL DECOMPRESSION/DISCECTOMY FUSION 2 LEVELS C3-5;  Surgeon: Meade Maw, MD;  Location: [facility] ORS;  Service: Neurosurgery;  Laterality: N/A;  . COLON SURGERY     due to diverticulitis  . CORONARY ARTERY BYPASS GRAFT    . LUMBAR LAMINECTOMY/DECOMPRESSION MICRODISCECTOMY N/A 09/01/2019   Procedure: LUMBAR LAMINECTOMY/DECOMPRESSION MICRODISCECTOMY 3 LEVELS L2-L5;  Surgeon: Meade Maw, MD;  Location: [facility] ORS;  Service: Neurosurgery;  Laterality: N/A;  . neck fusion    . SHOULDER ARTHROSCOPY WITH OPEN ROTATOR CUFF REPAIR Right 04/08/2016   Procedure: SHOULDER ARTHROSCOPY WITH OPEN ROTATOR CUFF REPAIR;  Surgeon: Earnestine Leys, MD;  Location: [facility] ORS;  Service: Orthopedics;  Laterality: Right;  . TONSILLECTOMY    . TRANSFORAMINAL LUMBAR INTERBODY FUSION W/ MIS 1 LEVEL N/A 09/01/2019   Procedure: MINIMALLY INVASIVE (MIS) TRANSFORAMINAL LUMBAR INTERBODY FUSION (TLIF) 1 LEVEL L5-S1;  Surgeon: Meade Maw, MD;  Location: [facility] ORS;  Service: Neurosurgery;  Laterality: N/A;    Prior to Admission medications   Medication Sig Start Date End Date Taking? Authorizing Provider  albuterol (VENTOLIN HFA) 108 (90 Base) MCG/ACT inhaler Inhale  1-2 puffs into the lungs every 6 (six) hours as needed for wheezing or shortness of breath.    [provider]  amitriptyline (ELAVIL) 50 MG tablet Take 50 mg by mouth at bedtime.    [provider]  Cholecalciferol (VITAMIN D3 PO) Take 1 tablet by mouth daily.    [provider]  citalopram (CELEXA) 40 MG tablet Take 40 mg by mouth every morning.     [provider]  fluticasone (FLOVENT DISKUS) 50 MCG/BLIST  diskus inhaler Inhale 1 puff into the lungs 2 (two) times daily as needed (for respiratory issues.).     [provider]  gabapentin (NEURONTIN) 600 MG tablet Take 600 mg by mouth 2 (two) times daily.    [provider]  lansoprazole (PREVACID) 15 MG capsule Take 15 mg by mouth daily before breakfast.     [provider]  lisinopril (PRINIVIL,ZESTRIL) 10 MG tablet Take 10 mg by mouth every morning.     [provider]  nitroGLYCERIN (NITROSTAT) 0.4 MG SL tablet Place 0.4 mg under the tongue every 5 (five) minutes as needed for chest pain.    [provider]  omeprazole (PRILOSEC OTC) 20 MG tablet Take 20 mg by mouth every morning.    [provider]  oxyCODONE-acetaminophen (PERCOCET) 5-325 MG tablet Take 1 tablet by mouth every 8 (eight) hours as needed for up to 5 days for severe pain. 10/29/20 11/03/20  Lucrezia Starch, MD  pravastatin (PRAVACHOL) 40 MG tablet Take 40 mg by mouth every evening. 06/08/19   [provider]  tiZANidine (ZANAFLEX) 4 MG tablet Take 1 tablet (4 mg total) by mouth every 6 (six) hours as needed. 09/02/19   Marin Olp, PA-C  varenicline (CHANTIX) 1 MG tablet Take 1 mg by mouth 2 (two) times daily.    [provider]    Allergies Penicillins and Atorvastatin  No family history on file.  Social History Social History   Tobacco Use  . Smoking status: Former Smoker    Packs/day: 1.00    Years: 50.00    Pack years: 50.00    Types: Cigarettes    Quit date: 02/07/2018    Years since quitting: 2.7  . Smokeless tobacco: Never Used  Vaping Use  . Vaping Use: Former  Substance Use Topics  . Alcohol use: Yes    Comment: rarely  . Drug use: No    Review of Systems  Review of Systems  Constitutional: Negative for chills and fever.  HENT: Negative for sore throat.   Eyes: Negative for pain.  Respiratory: Negative for cough and stridor.   Cardiovascular: Negative for chest pain.   Gastrointestinal: Negative for vomiting.  Genitourinary: Negative for dysuria.  Musculoskeletal: Positive for back pain, myalgias and neck pain.  Skin: Negative for rash.  Neurological: Positive for focal weakness ( R arm). Negative for seizures, loss of consciousness and headaches.  Psychiatric/Behavioral: Negative for suicidal ideas.  All other systems reviewed and are negative.     ____________________________________________   PHYSICAL EXAM:  VITAL SIGNS: ED Triage Vitals  Enc Vitals Group     BP 10/29/20 1034 (!) 129/94     Pulse Rate 10/29/20 1034 83     Resp 10/29/20 1034 18     Temp 10/29/20 1034 98.7 F (37.1 C)     Temp Source 10/29/20 1034 Oral     SpO2 10/29/20 1034 96 %     Weight 10/29/20 1040 170 lb (77.1 kg)  Height 10/29/20 1040 6' (1.829 m)     Head Circumference --      Peak Flow --      Pain Score 10/29/20 1040 9     Pain Loc --      Pain Edu? --      Excl. in [place]? --    Vitals:   10/29/20 1034  BP: (!) 129/94  Pulse: 83  Resp: 18  Temp: 98.7 F (37.1 C)  SpO2: 96%   Physical Exam Vitals and nursing note reviewed.  Constitutional:      Appearance: He is well-developed.  HENT:     Head: Normocephalic and atraumatic.     Right Ear: External ear normal.     Left Ear: External ear normal.     Nose: Nose normal.     Mouth/Throat:     Mouth: Mucous membranes are moist.  Eyes:     Conjunctiva/sclera: Conjunctivae normal.  Cardiovascular:     Rate and Rhythm: Normal rate and regular rhythm.     Pulses: Normal pulses.     Heart sounds: No murmur heard.   Pulmonary:     Effort: Pulmonary effort is normal. No respiratory distress.     Breath sounds: Normal breath sounds.  Abdominal:     Palpations: Abdomen is soft.     Tenderness: There is no abdominal tenderness.  Musculoskeletal:     Cervical back: Neck supple.  Skin:    General: Skin is warm and dry.  Neurological:     Mental Status: He is alert and oriented to person, place, and  time.  Psychiatric:        Mood and Affect: Mood normal.     Patient does have some tenderness over his C-spine but no tenderness over the T or L-spine.  He is slightly weaker on flexion extension at the wrist and shoulder in the right arm compared to the left but has symmetric grip strength.  Sensation is intact in the distribution of the radial ulnar and median nerves in the bilateral upper extremities.  2+ radial pulse.  Patient has full strength and sensation throughout his bilateral lower extremities.  There are no large effusions deformities overlying skin changes of the patient's neck back or bilateral shoulders. ____________________________________________   LABS (all labs ordered are listed, but only abnormal results are displayed)  Labs Reviewed - No data to display ____________________________________________  ____________________________________________  RADIOLOGY  ED MD interpretation: Plain films of patient's bilateral shoulders are unremarkable  Official radiology report(s): DG Thoracic Spine 2 View  Result Date: 10/28/2020 CLINICAL DATA:  Fall with back pain, initial encounter EXAM: THORACIC SPINE 2 VIEWS COMPARISON:  None. FINDINGS: Vertebral body height is well maintained. Multilevel osteophytic changes are noted. No paraspinal mass is noted. IMPRESSION: Degenerative change without acute abnormality. Electronically Signed   By: Inez Catalina M.D.   On: 10/28/2020 16:26   DG Shoulder Right  Result Date: 10/29/2020 CLINICAL DATA:  Pt reports bilateral shoulder pain from recent fall. Most pain reported in right shoulder. Pt currently in C collar for positive cervical fx's. Hx of shoulder arthroscopy with open rotator cuff repair 04/08/16, anterior cervical decomp/discectomy fusion 01/28/2018. EXAM: RIGHT SHOULDER - 2+ VIEW COMPARISON:  09/15/2006 FINDINGS: No fracture.  No bone lesion. Right shoulder reverse arthroplasty appears well seated and well aligned. Mild AC joint  osteoarthritis. Soft tissues are unremarkable. IMPRESSION: 1. No fracture or dislocation. 2. No evidence of loosening of the right shoulder reverse arthroplasty. 3. Mild AC joint  osteoarthritis. Electronically Signed   By: Lajean Manes M.D.   On: 10/29/2020 12:06   CT Head Wo Contrast  Result Date: 10/28/2020 CLINICAL DATA:  Fall. Patient was knocked off a deck falling approximately 4 feet onto his back. Complaining of back pain. EXAM: CT HEAD WITHOUT CONTRAST CT CERVICAL SPINE WITHOUT CONTRAST TECHNIQUE: Multidetector CT imaging of the head and cervical spine was performed following the standard protocol without intravenous contrast. Multiplanar CT image reconstructions of the cervical spine were also generated. COMPARISON:  Cervical CT, 06/21/2019.  Head CT, 10/22/2012. FINDINGS: CT HEAD FINDINGS Brain: No evidence of acute infarction, hemorrhage, hydrocephalus, extra-axial collection or mass lesion/mass effect. Vascular: No hyperdense vessel or unexpected calcification. Skull: Normal. Negative for fracture or focal lesion. Sinuses/Orbits: Globes and orbits are unremarkable. Visualized sinuses are clear. Other: None. CT CERVICAL SPINE FINDINGS Alignment: Normal. Skull base and vertebrae: Acute nondisplaced fractures of C6. Specifically, there is a left pedicle fracture and there is a fracture through the right facet extending to the adjacent lamina. No fracture comminution. No other fractures.  No bone lesions. Anterior fusion plates and fixation screws spans C3 through C6, with disc spacers and bone graft material, all well seated and stable compared to the prior CT. Soft tissues and spinal canal: No prevertebral fluid or swelling. No visible canal hematoma. Disc levels: Moderate loss of disc height at C6-C7 and C7-T1. No convincing disc herniation. There is disc bulging with uncovertebral spurring with varying degrees of neural foraminal narrowing, greatest on the left at C5-C6 and C6-C7, moderate severity.  Upper chest: No acute findings. Emphysema evident at the lung apices. Other: None. IMPRESSION: HEAD CT 1. Normal. CERVICAL CT 1. Acute nondisplaced, non comminuted fractures of the posterior elements of C6, the left pedicle the right facet and adjacent lamina. No other fractures. No spondylolisthesis. No evidence of loosening/disruption of the anterior fusion hardware spanning C3 through C6. Electronically Signed   By: Lajean Manes M.D.   On: 10/28/2020 16:37   CT Cervical Spine Wo Contrast  Result Date: 10/28/2020 CLINICAL DATA:  Fall. Patient was knocked off a deck falling approximately 4 feet onto his back. Complaining of back pain. EXAM: CT HEAD WITHOUT CONTRAST CT CERVICAL SPINE WITHOUT CONTRAST TECHNIQUE: Multidetector CT imaging of the head and cervical spine was performed following the standard protocol without intravenous contrast. Multiplanar CT image reconstructions of the cervical spine were also generated. COMPARISON:  Cervical CT, 06/21/2019.  Head CT, 10/22/2012. FINDINGS: CT HEAD FINDINGS Brain: No evidence of acute infarction, hemorrhage, hydrocephalus, extra-axial collection or mass lesion/mass effect. Vascular: No hyperdense vessel or unexpected calcification. Skull: Normal. Negative for fracture or focal lesion. Sinuses/Orbits: Globes and orbits are unremarkable. Visualized sinuses are clear. Other: None. CT CERVICAL SPINE FINDINGS Alignment: Normal. Skull base and vertebrae: Acute nondisplaced fractures of C6. Specifically, there is a left pedicle fracture and there is a fracture through the right facet extending to the adjacent lamina. No fracture comminution. No other fractures.  No bone lesions. Anterior fusion plates and fixation screws spans C3 through C6, with disc spacers and bone graft material, all well seated and stable compared to the prior CT. Soft tissues and spinal canal: No prevertebral fluid or swelling. No visible canal hematoma. Disc levels: Moderate loss of disc height at  C6-C7 and C7-T1. No convincing disc herniation. There is disc bulging with uncovertebral spurring with varying degrees of neural foraminal narrowing, greatest on the left at C5-C6 and C6-C7, moderate severity. Upper chest: No acute findings. Emphysema evident  at the lung apices. Other: None. IMPRESSION: HEAD CT 1. Normal. CERVICAL CT 1. Acute nondisplaced, non comminuted fractures of the posterior elements of C6, the left pedicle the right facet and adjacent lamina. No other fractures. No spondylolisthesis. No evidence of loosening/disruption of the anterior fusion hardware spanning C3 through C6. Electronically Signed   By: Lajean Manes M.D.   On: 10/28/2020 16:37   MR Cervical Spine Wo Contrast  Result Date: 10/29/2020 CLINICAL DATA:  Fall 4 days ago with cervical spine fracture. Neck pain. EXAM: MRI CERVICAL SPINE WITHOUT CONTRAST TECHNIQUE: Multiplanar, multisequence MR imaging of the cervical spine was performed. No intravenous contrast was administered. COMPARISON:  CT cervical spine 10/28/2020. MRI cervical spine 01/02/2018 FINDINGS: Alignment: Normal alignment. Vertebrae: Acute cervical spine fractures best seen on CT yesterday. These include a right inferior articulating facet fracture of C6 and a left pedicle fracture of C6. Suboptimal MRI image quality however no significant displacement or bone marrow edema identified. No vertebral body fracture. Cord: Normal signal and morphology. Posterior Fossa, vertebral arteries, paraspinal tissues: Negative for paraspinous mass, adenopathy, or hematoma. Disc levels: C2-3: Mild disc degeneration and mild facet degeneration. Mild left foraminal stenosis due to spurring C3-4: ACDF. Diffuse uncinate spurring with moderate foraminal stenosis bilaterally. No cord deformity or spinal canal stenosis. C4-5: ACDF. Diffuse uncinate spurring with moderate foraminal stenosis bilaterally. Spinal canal normal in size C5-6: ACDF. Diffuse uncinate spurring. Moderate foraminal  stenosis bilaterally. C6-7: ACDF with central disc protrusion touching the cord causing mild spinal stenosis. Diffuse uncinate spurring with foraminal narrowing bilaterally. C7-T1: Mild facet degeneration on the right.  Negative for stenosis. IMPRESSION: 1. Acute cervical spine fractures best seen on CT yesterday. These include nondisplaced fractures of the right inferior articulating facet of C6 and left pedicle C6. No spinal canal hematoma identified. 2. ACDF C3 through C7. Multilevel foraminal stenosis bilaterally due to prominent spurring. Central disc protrusion C6-7 with mild spinal stenosis and moderate foraminal stenosis bilaterally. Electronically Signed   By: Franchot Gallo M.D.   On: 10/29/2020 13:40   DG Shoulder Left  Result Date: 10/29/2020 CLINICAL DATA:  Pt reports bilateral shoulder pain from recent fall. Most pain reported in right shoulder. Pt currently in C collar for positive cervical fx's. Hx of shoulder arthroscopy with open rotator cuff repair 04/08/16, anterior cervical decomp/discectomy fusion 01/28/2018. EXAM: LEFT SHOULDER - 2+ VIEW COMPARISON:  09/15/2006 FINDINGS: No fracture. No bone lesion. Glenohumeral joint normally spaced and aligned. Mild AC joint degenerative change. AC joint normally aligned. Soft tissues are unremarkable. IMPRESSION: No fracture or dislocation. Electronically Signed   By: Lajean Manes M.D.   On: 10/29/2020 12:05    ____________________________________________   PROCEDURES  Procedure(s) performed (including Critical Care):  Procedures   ____________________________________________   INITIAL IMPRESSION / ASSESSMENT AND PLAN / ED COURSE      Overall patient's history, exam, and ED work-up as well as CT is obtained yesterday prior to patient being seen for concern for acute cervical spine injury as detailed above likely the etiology of patient's symptoms.  He has no fracture dislocation of his bilateral shoulders and no evidence of any acute  injuries on imaging of the T-spine.  Did discuss patient's presentation and initial work-up with on-call neurosurgeon Dr. Cari Caraway who recommended obtaining MRI given some weakness in the right upper extremity.  I did review the MRI with Dr. Cari Caraway instead patient was safe for discharge from perspective of his cervical spine injury with plan for hard collar at all times  and outpatient clinic follow-up with him.  Very low suspicion for other occult injuries patient is vastly intact in his bilateral extremities.  He is a little bit of weakness in his right arm but otherwise is neurovascularly intact.  No history or exam findings to suggest significant metabolic, electrolyte, or infectious pathologies at this time.  Patient given below noted algesia and on reassessment stated he felt much better.  Given stable vital signs otherwise reassuring improvement in pain with below noted analgesia and plan for close outpatient neurosurgery follow-up I think discharge is reasonable.  Discharge stable condition.  Return precautions advised and discussed       ____________________________________________   FINAL CLINICAL IMPRESSION(S) / ED DIAGNOSES  Final diagnoses:  Closed fracture of cervical vertebra, unspecified cervical vertebral level, initial encounter (HCC)    Medications  lidocaine (LIDODERM) 5 % 1 patch (1 patch Transdermal Patch Applied 10/29/20 1153)  oxyCODONE-acetaminophen (PERCOCET/ROXICET) 5-325 MG per tablet 1 tablet (1 tablet Oral Given 10/29/20 1152)  cyclobenzaprine (FLEXERIL) tablet 5 mg (5 mg Oral Given 10/29/20 1151)     ED Discharge Orders         Ordered    oxyCODONE-acetaminophen (PERCOCET) 5-325 MG tablet  Every 8 hours PRN        10/29/20 1442           Note:  This document was prepared using Dragon voice recognition software and may include unintentional dictation errors.   Lucrezia Starch, MD 10/29/20 [phone]

## 2021-04-21 ENCOUNTER — Emergency Department
Admission: EM | Admit: 2021-04-21 | Discharge: 2021-04-21 | Disposition: A | Discharge status: Home / Self Care | Payer: Medicare Other | Attending: Emergency Medicine | Admitting: Emergency Medicine

## 2021-04-21 ENCOUNTER — Other Ambulatory Visit: Payer: Self-pay

## 2021-04-21 DIAGNOSIS — I10 Essential (primary) hypertension: Secondary | ICD-10-CM | POA: Insufficient documentation

## 2021-04-21 DIAGNOSIS — I2510 Atherosclerotic heart disease of native coronary artery without angina pectoris: Secondary | ICD-10-CM | POA: Diagnosis not present

## 2021-04-21 DIAGNOSIS — M545 Low back pain, unspecified: Secondary | ICD-10-CM | POA: Diagnosis present

## 2021-04-21 DIAGNOSIS — Z951 Presence of aortocoronary bypass graft: Secondary | ICD-10-CM | POA: Diagnosis not present

## 2021-04-21 DIAGNOSIS — E119 Type 2 diabetes mellitus without complications: Secondary | ICD-10-CM | POA: Diagnosis not present

## 2021-04-21 DIAGNOSIS — G8929 Other chronic pain: Secondary | ICD-10-CM | POA: Diagnosis not present

## 2021-04-21 DIAGNOSIS — Z79899 Other long term (current) drug therapy: Secondary | ICD-10-CM | POA: Diagnosis not present

## 2021-04-21 DIAGNOSIS — Z87891 Personal history of nicotine dependence: Secondary | ICD-10-CM | POA: Diagnosis not present

## 2021-04-21 MED ORDER — KETOROLAC TROMETHAMINE 30 MG/ML IJ SOLN
30.0000 mg | Freq: Once | INTRAMUSCULAR | Status: AC
  Administered 2021-04-21: 30 mg via INTRAMUSCULAR
  Filled 2021-04-21: qty 1

## 2021-04-21 MED ORDER — ORPHENADRINE CITRATE 30 MG/ML IJ SOLN
60.0000 mg | INTRAMUSCULAR | Status: AC
  Administered 2021-04-21: 60 mg via INTRAMUSCULAR
  Filled 2021-04-21: qty 2

## 2021-04-21 NOTE — ED Triage Notes (Addendum)
Pt comes pov from home with lower back pain. Pt states he always has back pain but today it was worse when he woke up. Pt states he takes multiple chronic pain and nerve meds but did not take any this morning.

## 2021-04-21 NOTE — ED Provider Notes (Signed)
[facility] Emergency Department Provider Note  ___________________________________________   Event Date/Time   First MD Initiated Contact with Patient 04/21/21 1146     (approximate)  I have reviewed the triage vital signs and the nursing notes.   HISTORY  Chief Complaint Back Pain  HPI [name] is a 65 y.o. male with the below medical history, including prior [facility] DF lumbar fusion surgery, presents for acute flare of his chronic pain.  He denies any recent injury, trauma, or fall.  He also denies any bladder or bowel incontinence, foot drop, or saddle anesthesia.  He describes the acute flare occurred after he got up this morning, and went to the restroom.  He has difficult time raising himself from a seated position from the toilet.  He denies any current management by pain specialist, and notes that he does not currently take any muscle relaxants.  He does take medications for chronic pain and neuropathy including gabapentin.  He presents now for evaluation of his acute on chronic pain.  Patient denies additional any chest pain, shortness of breath, fever, chills, or sweats.         Past Medical History:  Diagnosis Date  . Anginal pain ([place])   . Arthritis   . Bronchitis    h/o  . Coronary artery disease   . Depression   . Diabetes mellitus without complication (HCC)    diet controlled  . Dyspnea    with exertion  . GERD (gastroesophageal reflux disease)   . History of MRSA infection 2010  . Hyperlipidemia   . Hypertension   . Insomnia   . Peripheral vascular disease ([place])   . Stomach ulcer     Patient Active Problem List   Diagnosis Date Noted  . S/P lumbar fusion 09/01/2019  . CORONARY ARTERY DISEASE 07/24/2007  . NICOTINE ADDICTION 07/23/2007  . HYPERTENSION 07/23/2007  . DENTAL CARIES 07/23/2007  . GERD 07/23/2007  . ROTATOR CUFF INJURY, LEFT SHOULDER 07/23/2007    Past Surgical History:  Procedure Laterality Date  .  ANTERIOR CERVICAL DECOMP/DISCECTOMY FUSION N/A 01/28/2018   Procedure: ANTERIOR CERVICAL DECOMPRESSION/DISCECTOMY FUSION 2 LEVELS C3-5;  Surgeon: Meade Maw, MD;  Location: [facility] ORS;  Service: Neurosurgery;  Laterality: N/A;  . COLON SURGERY     due to diverticulitis  . CORONARY ARTERY BYPASS GRAFT    . LUMBAR LAMINECTOMY/DECOMPRESSION MICRODISCECTOMY N/A 09/01/2019   Procedure: LUMBAR LAMINECTOMY/DECOMPRESSION MICRODISCECTOMY 3 LEVELS L2-L5;  Surgeon: Meade Maw, MD;  Location: [facility] ORS;  Service: Neurosurgery;  Laterality: N/A;  . neck fusion    . SHOULDER ARTHROSCOPY WITH OPEN ROTATOR CUFF REPAIR Right 04/08/2016   Procedure: SHOULDER ARTHROSCOPY WITH OPEN ROTATOR CUFF REPAIR;  Surgeon: Earnestine Leys, MD;  Location: [facility] ORS;  Service: Orthopedics;  Laterality: Right;  . TONSILLECTOMY    . TRANSFORAMINAL LUMBAR INTERBODY FUSION W/ MIS 1 LEVEL N/A 09/01/2019   Procedure: MINIMALLY INVASIVE (MIS) TRANSFORAMINAL LUMBAR INTERBODY FUSION (TLIF) 1 LEVEL L5-S1;  Surgeon: Meade Maw, MD;  Location: [facility] ORS;  Service: Neurosurgery;  Laterality: N/A;    Prior to Admission medications   Medication Sig Start Date End Date Taking? Authorizing Provider  albuterol (VENTOLIN HFA) 108 (90 Base) MCG/ACT inhaler Inhale 1-2 puffs into the lungs every 6 (six) hours as needed for wheezing or shortness of breath.    [provider]  amitriptyline (ELAVIL) 50 MG tablet Take 50 mg by mouth at bedtime.    [provider]  Cholecalciferol (VITAMIN D3 PO) Take  1 tablet by mouth daily.    [provider]  citalopram (CELEXA) 40 MG tablet Take 40 mg by mouth every morning.     [provider]  fluticasone (FLOVENT DISKUS) 50 MCG/BLIST diskus inhaler Inhale 1 puff into the lungs 2 (two) times daily as needed (for respiratory issues.).     [provider]  gabapentin (NEURONTIN) 600 MG tablet Take 600 mg by mouth 2 (two) times daily.    [provider]  lansoprazole (PREVACID) 15 MG capsule Take 15 mg by mouth daily before breakfast.     [provider]  lisinopril (PRINIVIL,ZESTRIL) 10 MG tablet Take 10 mg by mouth every morning.     [provider]  nitroGLYCERIN (NITROSTAT) 0.4 MG SL tablet Place 0.4 mg under the tongue every 5 (five) minutes as needed for chest pain.    [provider]  omeprazole (PRILOSEC OTC) 20 MG tablet Take 20 mg by mouth every morning.    [provider]  pravastatin (PRAVACHOL) 40 MG tablet Take 40 mg by mouth every evening. 06/08/19   [provider]  tiZANidine (ZANAFLEX) 4 MG tablet Take 1 tablet (4 mg total) by mouth every 6 (six) hours as needed. 09/02/19   Marin Olp, PA-C  varenicline (CHANTIX) 1 MG tablet Take 1 mg by mouth 2 (two) times daily.    [provider]    Allergies Penicillins and Atorvastatin  History reviewed. No pertinent family history.  Social History Social History   Tobacco Use  . Smoking status: Former Smoker    Packs/day: 1.00    Years: 50.00    Pack years: 50.00    Types: Cigarettes    Quit date: 02/07/2018    Years since quitting: 3.2  . Smokeless tobacco: Never Used  Vaping Use  . Vaping Use: Former  Substance Use Topics  . Alcohol use: Yes    Comment: rarely  . Drug use: No    Review of Systems  Constitutional: No fever/chills Eyes: No visual changes. ENT: No sore throat. Cardiovascular: Denies chest pain. Respiratory: Denies shortness of breath. Gastrointestinal: No abdominal pain.  No nausea, no vomiting.  No diarrhea.  No constipation. Genitourinary: Negative for dysuria. Musculoskeletal: Positive for back pain. Skin: Negative for rash. Neurological: Negative for headaches, focal weakness or numbness. ____________________________________________   PHYSICAL EXAM:  VITAL SIGNS: ED Triage Vitals  Enc Vitals Group     BP 04/21/21 1126 (!) 174/85     Pulse Rate 04/21/21 1123 (!) 58     Resp  04/21/21 1123 18     Temp 04/21/21 1123 98.4 F (36.9 C)     Temp Source 04/21/21 1123 Oral     SpO2 04/21/21 1123 97 %     Weight 04/21/21 1121 175 lb (79.4 kg)     Height 04/21/21 1121 6' (1.829 m)     Head Circumference --      Peak Flow --      Pain Score 04/21/21 1121 10     Pain Loc --      Pain Edu? --      Excl. in [place]? --     Constitutional: Alert and oriented. Well appearing and in no acute distress. Eyes: Conjunctivae are normal. PERRL. EOMI. Head: Atraumatic. Nose: No congestion/rhinnorhea. Mouth/Throat: Mucous membranes are moist.  Oropharynx non-erythematous. Neck: No stridor.  No cervical spine tenderness to palpation. Cardiovascular: Normal rate, regular rhythm. Grossly normal heart sounds.  Good peripheral circulation. Respiratory: Normal respiratory effort.  No retractions. Lungs CTAB. Gastrointestinal: Soft and nontender. No distention. No abdominal bruits. No CVA tenderness. Musculoskeletal: Normal spinal alignment without midline tenderness, spasm, vomiting, or step-off.  Patient mildly tender to palpation over the right SI joint region.  He is able to transition from sit to stand without assistance.  No lower extremity tenderness nor edema.  No joint effusions. Neurologic: Cranial nerves II to XII grossly intact.  Normal LE DTRs bilaterally.  Normal toe dorsiflexion on exam.  Negative seated straight leg raise bilaterally.  Normal speech and language. No gross focal neurologic deficits are appreciated. No gait instability. Skin:  Skin is warm, dry and intact. No rash noted. Psychiatric: Mood and affect are normal. Speech and behavior are normal.  ____________________________________________   LABS (all labs ordered are listed, but only abnormal results are displayed)  Labs Reviewed - No data to display ____________________________________________  EKG   ____________________________________________  RADIOLOGY I, Melvenia Needles, personally viewed  and evaluated these images (plain radiographs) as part of my medical decision making, as well as reviewing the written report by the radiologist.  ED MD interpretation:    Official radiology report(s): No results found.  ____________________________________________   PROCEDURES  Procedure(s) performed (including Critical Care):  Procedures   ____________________________________________   INITIAL IMPRESSION / ASSESSMENT AND PLAN / ED COURSE  As part of my medical decision making, I reviewed the following data within the [place] [facility] chart reviewed, Notes from prior ED visits and [place] Controlled Substance Database  DDX: acute flare of chronic pain, lumbar radiculopathy, sacroiliitis,   Patient with acute on chronic low back pain flare on presentation to the ED.  Exam is overall benign return at this time.  No signs of acute neuromuscular deficit or red flags on exam.  Patient be treated with IM medication administration at this time, reevaluated after appropriate medical.  He will likely be referred to his primary provider for further management.  He may also consider referral to pain management.  ----------------------------------------- 1:17 PM on 04/21/2021 ----------------------------------------- Informed by the RN that patient apparently left's subsequent to his IM medication administration.  He did not await final disposition or reevaluation.  ____________________________________________   FINAL CLINICAL IMPRESSION(S) / ED DIAGNOSES  Final diagnoses:  Chronic midline low back pain without sciatica     ED Discharge Orders    None      *Please note:  [name] was evaluated in Emergency Department on 04/21/2021 for the symptoms described in the history of present illness. He was evaluated in the context of the global COVID-19 pandemic, which necessitated consideration that the patient might be at risk for infection with the SARS-CoV-2 virus  that causes COVID-19. Institutional protocols and algorithms that pertain to the evaluation of patients at risk for COVID-19 are in a state of rapid change based on information released by regulatory bodies including the CDC and federal and state organizations. These policies and algorithms were followed during the patient's care in the ED.  Some ED evaluations and interventions may be delayed as a result of limited staffing during and the pandemic.*   Note:  This document was prepared using Dragon voice recognition software and may include unintentional dictation errors.    Melvenia Needles, PA-C 04/21/21 1338    Blake Divine, MD 04/22/21 [phone]

## 2021-04-21 NOTE — ED Notes (Signed)
Follow up ortho info provided , all questions answered

## 2021-12-26 DIAGNOSIS — M542 Cervicalgia: Secondary | ICD-10-CM | POA: Diagnosis not present

## 2021-12-26 DIAGNOSIS — G629 Polyneuropathy, unspecified: Secondary | ICD-10-CM | POA: Diagnosis not present

## 2021-12-26 DIAGNOSIS — E7800 Pure hypercholesterolemia, unspecified: Secondary | ICD-10-CM | POA: Diagnosis not present

## 2021-12-26 DIAGNOSIS — R69 Illness, unspecified: Secondary | ICD-10-CM | POA: Diagnosis not present

## 2021-12-26 DIAGNOSIS — I2510 Atherosclerotic heart disease of native coronary artery without angina pectoris: Secondary | ICD-10-CM | POA: Diagnosis not present

## 2021-12-26 DIAGNOSIS — G8929 Other chronic pain: Secondary | ICD-10-CM | POA: Diagnosis not present

## 2021-12-26 DIAGNOSIS — I10 Essential (primary) hypertension: Secondary | ICD-10-CM | POA: Diagnosis not present

## 2021-12-26 DIAGNOSIS — M75110 Incomplete rotator cuff tear or rupture of unspecified shoulder, not specified as traumatic: Secondary | ICD-10-CM | POA: Diagnosis not present

## 2021-12-26 DIAGNOSIS — E119 Type 2 diabetes mellitus without complications: Secondary | ICD-10-CM | POA: Diagnosis not present

## 2021-12-26 DIAGNOSIS — Z6827 Body mass index (BMI) 27.0-27.9, adult: Secondary | ICD-10-CM | POA: Diagnosis not present

## 2021-12-31 DIAGNOSIS — E785 Hyperlipidemia, unspecified: Secondary | ICD-10-CM | POA: Diagnosis not present

## 2021-12-31 DIAGNOSIS — Z0000 Encounter for general adult medical examination without abnormal findings: Secondary | ICD-10-CM | POA: Diagnosis not present

## 2021-12-31 DIAGNOSIS — Z9181 History of falling: Secondary | ICD-10-CM | POA: Diagnosis not present

## 2021-12-31 DIAGNOSIS — Z1331 Encounter for screening for depression: Secondary | ICD-10-CM | POA: Diagnosis not present

## 2022-01-03 DIAGNOSIS — M25512 Pain in left shoulder: Secondary | ICD-10-CM | POA: Diagnosis not present

## 2022-04-26 DIAGNOSIS — I2510 Atherosclerotic heart disease of native coronary artery without angina pectoris: Secondary | ICD-10-CM | POA: Diagnosis not present

## 2022-04-26 DIAGNOSIS — E119 Type 2 diabetes mellitus without complications: Secondary | ICD-10-CM | POA: Diagnosis not present

## 2022-04-26 DIAGNOSIS — M75110 Incomplete rotator cuff tear or rupture of unspecified shoulder, not specified as traumatic: Secondary | ICD-10-CM | POA: Diagnosis not present

## 2022-04-26 DIAGNOSIS — I10 Essential (primary) hypertension: Secondary | ICD-10-CM | POA: Diagnosis not present

## 2022-04-26 DIAGNOSIS — G8929 Other chronic pain: Secondary | ICD-10-CM | POA: Diagnosis not present

## 2022-04-26 DIAGNOSIS — R69 Illness, unspecified: Secondary | ICD-10-CM | POA: Diagnosis not present

## 2022-04-26 DIAGNOSIS — M542 Cervicalgia: Secondary | ICD-10-CM | POA: Diagnosis not present

## 2022-04-26 DIAGNOSIS — G629 Polyneuropathy, unspecified: Secondary | ICD-10-CM | POA: Diagnosis not present

## 2022-04-26 DIAGNOSIS — E7800 Pure hypercholesterolemia, unspecified: Secondary | ICD-10-CM | POA: Diagnosis not present

## 2022-07-11 DIAGNOSIS — M25512 Pain in left shoulder: Secondary | ICD-10-CM | POA: Diagnosis not present

## 2022-07-31 DIAGNOSIS — G629 Polyneuropathy, unspecified: Secondary | ICD-10-CM | POA: Diagnosis not present

## 2022-07-31 DIAGNOSIS — G8929 Other chronic pain: Secondary | ICD-10-CM | POA: Diagnosis not present

## 2022-07-31 DIAGNOSIS — E1169 Type 2 diabetes mellitus with other specified complication: Secondary | ICD-10-CM | POA: Diagnosis not present

## 2022-07-31 DIAGNOSIS — M542 Cervicalgia: Secondary | ICD-10-CM | POA: Diagnosis not present

## 2022-07-31 DIAGNOSIS — M79642 Pain in left hand: Secondary | ICD-10-CM | POA: Diagnosis not present

## 2022-07-31 DIAGNOSIS — E7800 Pure hypercholesterolemia, unspecified: Secondary | ICD-10-CM | POA: Diagnosis not present

## 2022-07-31 DIAGNOSIS — M75110 Incomplete rotator cuff tear or rupture of unspecified shoulder, not specified as traumatic: Secondary | ICD-10-CM | POA: Diagnosis not present

## 2022-07-31 DIAGNOSIS — I2510 Atherosclerotic heart disease of native coronary artery without angina pectoris: Secondary | ICD-10-CM | POA: Diagnosis not present

## 2022-07-31 DIAGNOSIS — R69 Illness, unspecified: Secondary | ICD-10-CM | POA: Diagnosis not present

## 2022-07-31 DIAGNOSIS — M79641 Pain in right hand: Secondary | ICD-10-CM | POA: Diagnosis not present

## 2022-07-31 DIAGNOSIS — I10 Essential (primary) hypertension: Secondary | ICD-10-CM | POA: Diagnosis not present

## 2022-10-31 DIAGNOSIS — G8929 Other chronic pain: Secondary | ICD-10-CM | POA: Diagnosis not present

## 2022-10-31 DIAGNOSIS — I2510 Atherosclerotic heart disease of native coronary artery without angina pectoris: Secondary | ICD-10-CM | POA: Diagnosis not present

## 2022-10-31 DIAGNOSIS — M542 Cervicalgia: Secondary | ICD-10-CM | POA: Diagnosis not present

## 2022-10-31 DIAGNOSIS — E1169 Type 2 diabetes mellitus with other specified complication: Secondary | ICD-10-CM | POA: Diagnosis not present

## 2022-10-31 DIAGNOSIS — I10 Essential (primary) hypertension: Secondary | ICD-10-CM | POA: Diagnosis not present

## 2022-10-31 DIAGNOSIS — Z125 Encounter for screening for malignant neoplasm of prostate: Secondary | ICD-10-CM | POA: Diagnosis not present

## 2022-10-31 DIAGNOSIS — G629 Polyneuropathy, unspecified: Secondary | ICD-10-CM | POA: Diagnosis not present

## 2022-10-31 DIAGNOSIS — E7800 Pure hypercholesterolemia, unspecified: Secondary | ICD-10-CM | POA: Diagnosis not present

## 2022-10-31 DIAGNOSIS — R69 Illness, unspecified: Secondary | ICD-10-CM | POA: Diagnosis not present

## 2022-10-31 DIAGNOSIS — J309 Allergic rhinitis, unspecified: Secondary | ICD-10-CM | POA: Diagnosis not present

## 2022-12-05 DIAGNOSIS — R748 Abnormal levels of other serum enzymes: Secondary | ICD-10-CM | POA: Diagnosis not present

## 2023-01-09 DIAGNOSIS — M75110 Incomplete rotator cuff tear or rupture of unspecified shoulder, not specified as traumatic: Secondary | ICD-10-CM | POA: Diagnosis not present

## 2023-01-09 DIAGNOSIS — M25561 Pain in right knee: Secondary | ICD-10-CM | POA: Diagnosis not present

## 2023-01-09 DIAGNOSIS — M25512 Pain in left shoulder: Secondary | ICD-10-CM | POA: Diagnosis not present

## 2023-01-29 ENCOUNTER — Other Ambulatory Visit: Payer: Self-pay | Admitting: Physician Assistant

## 2023-01-29 DIAGNOSIS — R748 Abnormal levels of other serum enzymes: Secondary | ICD-10-CM

## 2023-01-29 DIAGNOSIS — I10 Essential (primary) hypertension: Secondary | ICD-10-CM | POA: Diagnosis not present

## 2023-01-29 DIAGNOSIS — G629 Polyneuropathy, unspecified: Secondary | ICD-10-CM | POA: Diagnosis not present

## 2023-01-29 DIAGNOSIS — E7800 Pure hypercholesterolemia, unspecified: Secondary | ICD-10-CM | POA: Diagnosis not present

## 2023-01-29 DIAGNOSIS — E1169 Type 2 diabetes mellitus with other specified complication: Secondary | ICD-10-CM | POA: Diagnosis not present

## 2023-01-29 DIAGNOSIS — Z9181 History of falling: Secondary | ICD-10-CM | POA: Diagnosis not present

## 2023-01-29 DIAGNOSIS — G8929 Other chronic pain: Secondary | ICD-10-CM | POA: Diagnosis not present

## 2023-01-29 DIAGNOSIS — M542 Cervicalgia: Secondary | ICD-10-CM | POA: Diagnosis not present

## 2023-01-29 DIAGNOSIS — Z87891 Personal history of nicotine dependence: Secondary | ICD-10-CM | POA: Diagnosis not present

## 2023-01-29 DIAGNOSIS — I2510 Atherosclerotic heart disease of native coronary artery without angina pectoris: Secondary | ICD-10-CM | POA: Diagnosis not present

## 2023-01-29 DIAGNOSIS — E611 Iron deficiency: Secondary | ICD-10-CM | POA: Diagnosis not present

## 2023-01-29 DIAGNOSIS — R69 Illness, unspecified: Secondary | ICD-10-CM | POA: Diagnosis not present

## 2023-01-30 ENCOUNTER — Other Ambulatory Visit: Payer: Self-pay | Admitting: Physician Assistant

## 2023-01-30 DIAGNOSIS — Z87891 Personal history of nicotine dependence: Secondary | ICD-10-CM

## 2023-01-31 ENCOUNTER — Ambulatory Visit
Admission: RE | Admit: 2023-01-31 | Discharge: 2023-01-31 | Disposition: A | Discharge status: Home / Self Care | Payer: Medicare HMO | Source: Ambulatory Visit | Attending: Physician Assistant | Admitting: Physician Assistant

## 2023-01-31 DIAGNOSIS — R945 Abnormal results of liver function studies: Secondary | ICD-10-CM | POA: Diagnosis not present

## 2023-01-31 DIAGNOSIS — R748 Abnormal levels of other serum enzymes: Secondary | ICD-10-CM | POA: Diagnosis not present

## 2023-02-19 ENCOUNTER — Ambulatory Visit
Admission: RE | Admit: 2023-02-19 | Discharge: 2023-02-19 | Disposition: A | Discharge status: Home / Self Care | Payer: Medicare HMO | Source: Ambulatory Visit | Attending: Physician Assistant | Admitting: Physician Assistant

## 2023-02-19 DIAGNOSIS — Z87891 Personal history of nicotine dependence: Secondary | ICD-10-CM | POA: Diagnosis not present

## 2023-03-05 ENCOUNTER — Other Ambulatory Visit: Payer: Self-pay

## 2023-03-12 ENCOUNTER — Ambulatory Visit: Payer: Medicare HMO | Admitting: Gastroenterology

## 2023-03-12 ENCOUNTER — Other Ambulatory Visit: Payer: Self-pay

## 2023-03-12 ENCOUNTER — Encounter: Payer: Self-pay | Admitting: Gastroenterology

## 2023-03-12 VITALS — BP 162/80 | HR 80 | Temp 97.8°F | Ht 72.0 in | Wt 180.5 lb

## 2023-03-12 DIAGNOSIS — Z1211 Encounter for screening for malignant neoplasm of colon: Secondary | ICD-10-CM

## 2023-03-12 DIAGNOSIS — K7460 Unspecified cirrhosis of liver: Secondary | ICD-10-CM | POA: Diagnosis not present

## 2023-03-12 DIAGNOSIS — E782 Mixed hyperlipidemia: Secondary | ICD-10-CM | POA: Insufficient documentation

## 2023-03-12 DIAGNOSIS — I8510 Secondary esophageal varices without bleeding: Secondary | ICD-10-CM

## 2023-03-12 MED ORDER — SUTAB 1479-225-188 MG PO TABS: 12.0000 | ORAL_TABLET | Freq: Once | ORAL | 0 refills | Status: AC

## 2023-03-12 NOTE — Patient Instructions (Addendum)
If you need a coupon for Sutab please go to [employer]ate[employer].se and pick you have medicare and fill out the information. It will let you print a coupon   Low-Sodium Eating Plan Sodium, which is an element that makes up salt, helps you maintain a healthy balance of fluids in your body. Too much sodium can increase your blood pressure and cause fluid and waste to be held in your body. Your health care provider or dietitian may recommend following this plan if you have high blood pressure (hypertension), kidney disease, liver disease, or heart failure. Eating less sodium can help lower your blood pressure, reduce swelling, and protect your heart, liver, and kidneys. What are tips for following this plan? Reading food labels The Nutrition Facts label lists the amount of sodium in one serving of the food. If you eat more than one serving, you must multiply the listed amount of sodium by the number of servings. Choose foods with less than 140 mg of sodium per serving. Avoid foods with 300 mg of sodium or more per serving. Shopping  Look for lower-sodium products, often labeled as "low-sodium" or "no salt added." Always check the sodium content, even if foods are labeled as "unsalted" or "no salt added." Buy fresh foods. Avoid canned foods and pre-made or frozen meals. Avoid canned, cured, or processed meats. Buy breads that have less than 80 mg of sodium per slice. Cooking  Eat more home-cooked food and less restaurant, buffet, and fast food. Avoid adding salt when cooking. Use salt-free seasonings or herbs instead of table salt or sea salt. Check with your health care provider or pharmacist before using salt substitutes. Cook with plant-based oils, such as canola, sunflower, or olive oil. Meal planning When eating at a restaurant, ask that your food be prepared with less salt or no salt, if possible. Avoid dishes labeled as brined, pickled, cured, smoked, or made with soy sauce, miso, or teriyaki  sauce. Avoid foods that contain MSG (monosodium glutamate). MSG is sometimes added to [place] food, bouillon, and some canned foods. Make meals that can be grilled, baked, poached, roasted, or steamed. These are generally made with less sodium. General information Most people on this plan should limit their sodium intake to 1,500-2,000 mg (milligrams) of sodium each day. What foods should I eat? Fruits Fresh, frozen, or canned fruit. Fruit juice. Vegetables Fresh or frozen vegetables. "No salt added" canned vegetables. "No salt added" tomato sauce and paste. Low-sodium or reduced-sodium tomato and vegetable juice. Grains Low-sodium cereals, including oats, puffed wheat and rice, and shredded wheat. Low-sodium crackers. Unsalted rice. Unsalted pasta. Low-sodium bread. Whole-grain breads and whole-grain pasta. Meats and other proteins Fresh or frozen (no salt added) meat, poultry, seafood, and fish. Low-sodium canned tuna and salmon. Unsalted nuts. Dried peas, beans, and lentils without added salt. Unsalted canned beans. Eggs. Unsalted nut butters. Dairy Milk. Soy milk. Cheese that is naturally low in sodium, such as ricotta cheese, fresh mozzarella, or Swiss cheese. Low-sodium or reduced-sodium cheese. Cream cheese. Yogurt. Seasonings and condiments Fresh and dried herbs and spices. Salt-free seasonings. Low-sodium mustard and ketchup. Sodium-free salad dressing. Sodium-free light mayonnaise. Fresh or refrigerated horseradish. Lemon juice. Vinegar. Other foods Homemade, reduced-sodium, or low-sodium soups. Unsalted popcorn and pretzels. Low-salt or salt-free chips. The items listed above may not be a complete list of foods and beverages you can eat. Contact a dietitian for more information. What foods should I avoid? Vegetables Sauerkraut, pickled vegetables, and relishes. Olives. [place] fries. Onion rings. Regular canned  vegetables (not low-sodium or reduced-sodium). Regular canned tomato  sauce and paste (not low-sodium or reduced-sodium). Regular tomato and vegetable juice (not low-sodium or reduced-sodium). Frozen vegetables in sauces. Grains Instant hot cereals. Bread stuffing, pancake, and biscuit mixes. Croutons. Seasoned rice or pasta mixes. Noodle soup cups. Boxed or frozen macaroni and cheese. Regular salted crackers. Self-rising flour. Meats and other proteins Meat or fish that is salted, canned, smoked, spiced, or pickled. Precooked or cured meat, such as sausages or meat loaves. [name]. Ham. Pepperoni. Hot dogs. Corned beef. Chipped beef. Salt pork. Jerky. Pickled herring. Anchovies and sardines. Regular canned tuna. Salted nuts. Dairy Processed cheese and cheese spreads. Hard cheeses. Cheese curds. Blue cheese. Feta cheese. String cheese. Regular cottage cheese. Buttermilk. Canned milk. Fats and oils Salted butter. Regular margarine. Ghee. Bacon fat. Seasonings and condiments Onion salt, garlic salt, seasoned salt, table salt, and sea salt. Canned and packaged gravies. Worcestershire sauce. Tartar sauce. Barbecue sauce. Teriyaki sauce. Soy sauce, including reduced-sodium. Steak sauce. Fish sauce. Oyster sauce. Cocktail sauce. Horseradish that you find on the shelf. Regular ketchup and mustard. Meat flavorings and tenderizers. Bouillon cubes. Hot sauce. Pre-made or packaged marinades. Pre-made or packaged taco seasonings. Relishes. Regular salad dressings. Salsa. Other foods Salted popcorn and pretzels. Corn chips and puffs. Potato and tortilla chips. Canned or dried soups. Pizza. Frozen entrees and pot pies. The items listed above may not be a complete list of foods and beverages you should avoid. Contact a dietitian for more information. Summary Eating less sodium can help lower your blood pressure, reduce swelling, and protect your heart, liver, and kidneys. Most people on this plan should limit their sodium intake to 1,500-2,000 mg (milligrams) of sodium each  day. Canned, boxed, and frozen foods are high in sodium. Restaurant foods, fast foods, and pizza are also very high in sodium. You also get sodium by adding salt to food. Try to cook at home, eat more fresh fruits and vegetables, and eat less fast food and canned, processed, or prepared foods. This information is not intended to replace advice given to you by your health care provider. Make sure you discuss any questions you have with your health care provider. Document Revised: 10/18/2019 Document Reviewed: 10/13/2019 Elsevier Patient Education  2023 [employer].

## 2023-03-12 NOTE — Progress Notes (Signed)
[name], MD [address]  [address]  [place], [place] [address]  Main: [phone]  Fax: [phone]    Gastroenterology Consultation  Referring Provider:     [name], PA-C Primary Care Physician:  [name], PA-C Primary Gastroenterologist:  Dr. [name] Reason for Consultation: Cirrhosis of liver        HPI:   KAENAN JAKE is a 67 y.o. male referred by [name], PA-C  for consultation & management of cirrhosis of liver.  Patient underwent right upper quadrant ultrasound on 01/31/2023, found to have cirrhosis of liver.  This was performed for elevated LFTs.  Therefore, referred to GI to establish care.  He had elevated alkaline phosphatase in 2019, mildly elevated AST and ALT in 2012.  History of COPD, diabetes diet controlled, coronary artery disease, underwent CABG several years ago Patient smokes tobacco, accompanied by his daughter and son-in-law.  He denies any swelling of legs, abdominal distention, rectal bleeding, melena, nausea or vomiting, pruritus, jaundice. He reports having blood transfusion when he had CABG.  Denies any IV drug abuse, no known history of hepatitis B or C  NSAIDs: None  Antiplts/Anticoagulants/Anti thrombotics: None  GI Procedures: Reports undergoing upper endoscopy and colonoscopy more than 10 years ago  Past Medical History:  Diagnosis Date   Anginal pain (HCC)    Arthritis    Bronchitis    h/o   Coronary artery disease    Depression    Diabetes mellitus without complication (HCC)    diet controlled   Dyspnea    with exertion   GERD (gastroesophageal reflux disease)    History of MRSA infection 2010   Hyperlipidemia    Hypertension    Insomnia    Peripheral vascular disease (HCC)    Stomach ulcer     Past Surgical History:  Procedure Laterality Date   ANTERIOR CERVICAL DECOMP/DISCECTOMY FUSION N/A 01/28/2018   Procedure: ANTERIOR CERVICAL DECOMPRESSION/DISCECTOMY FUSION 2 LEVELS C3-5;   Surgeon: [name], MD;  Location: [facility] ORS;  Service: Neurosurgery;  Laterality: N/A;   COLON SURGERY     due to diverticulitis   CORONARY ARTERY BYPASS GRAFT     LUMBAR LAMINECTOMY/DECOMPRESSION MICRODISCECTOMY N/A 09/01/2019   Procedure: LUMBAR LAMINECTOMY/DECOMPRESSION MICRODISCECTOMY 3 LEVELS L2-L5;  Surgeon: [name], MD;  Location: [facility] ORS;  Service: Neurosurgery;  Laterality: N/A;   neck fusion     SHOULDER ARTHROSCOPY WITH OPEN ROTATOR CUFF REPAIR Right 04/08/2016   Procedure: SHOULDER ARTHROSCOPY WITH OPEN ROTATOR CUFF REPAIR;  Surgeon: [name], MD;  Location: [facility] ORS;  Service: Orthopedics;  Laterality: Right;   TONSILLECTOMY     TRANSFORAMINAL LUMBAR INTERBODY FUSION W/ MIS 1 LEVEL N/A 09/01/2019   Procedure: MINIMALLY INVASIVE (MIS) TRANSFORAMINAL LUMBAR INTERBODY FUSION (TLIF) 1 LEVEL L5-S1;  Surgeon: [name], MD;  Location: [facility] ORS;  Service: Neurosurgery;  Laterality: N/A;     Current Outpatient Medications:    albuterol (VENTOLIN HFA) 108 (90 Base) MCG/ACT inhaler, Inhale 1-2 puffs into the lungs every 6 (six) hours as needed for wheezing or shortness of breath., Disp: , Rfl:    amitriptyline (ELAVIL) 50 MG tablet, Take 50 mg by mouth at bedtime., Disp: , Rfl:    citalopram (CELEXA) 40 MG tablet, Take 40 mg by mouth every morning. , Disp: , Rfl:    DULoxetine (CYMBALTA) 60 MG capsule, Take 60 mg by mouth 2 (two) times daily., Disp: , Rfl:    fluticasone (FLONASE) 50 MCG/ACT nasal spray, Place  2 sprays into both nostrils daily., Disp: , Rfl:    fluticasone (FLOVENT DISKUS) 50 MCG/BLIST diskus inhaler, Inhale 1 puff into the lungs 2 (two) times daily as needed (for respiratory issues.). , Disp: , Rfl:    isosorbide mononitrate (IMDUR) 30 MG 24 hr tablet, Take 30 mg by mouth daily., Disp: , Rfl:    lansoprazole (PREVACID) 15 MG capsule, Take 15 mg by mouth daily before breakfast. , Disp: , Rfl:    lisinopril (ZESTRIL) 40 MG tablet, Take 40 mg  by mouth daily., Disp: , Rfl:    meloxicam (MOBIC) 15 MG tablet, Take 15 mg by mouth daily., Disp: , Rfl:    nitroGLYCERIN (NITROSTAT) 0.4 MG SL tablet, Place 0.4 mg under the tongue every 5 (five) minutes as needed for chest pain., Disp: , Rfl:    omeprazole (PRILOSEC OTC) 20 MG tablet, Take 20 mg by mouth every morning., Disp: , Rfl:    pregabalin (LYRICA) 300 MG capsule, Take 300 mg by mouth 2 (two) times daily., Disp: , Rfl:    rosuvastatin (CRESTOR) 20 MG tablet, Take 20 mg by mouth daily., Disp: , Rfl:    Sodium Sulfate-Mag Sulfate-KCl (SUTAB) [phone] MG TABS, Take 12 tablets by mouth once for 1 dose., Disp: 24 tablet, Rfl: 0   tamsulosin (FLOMAX) 0.4 MG CAPS capsule, Take 0.4 mg by mouth daily after supper., Disp: , Rfl:    tiZANidine (ZANAFLEX) 4 MG tablet, Take 1 tablet (4 mg total) by mouth every 6 (six) hours as needed., Disp: 30 tablet, Rfl: 0   valsartan (DIOVAN) 320 MG tablet, Take 320 mg by mouth daily., Disp: , Rfl:    No family history on file.   Social History   Tobacco Use   Smoking status: Former    Packs/day: 1.00    Years: 50.00    Additional pack years: 0.00    Total pack years: 50.00    Types: Cigarettes    Quit date: 02/07/2018    Years since quitting: 5.0   Smokeless tobacco: Never  Vaping Use   Vaping Use: Former  Substance Use Topics   Alcohol use: Yes    Comment: rarely   Drug use: No    Allergies as of 03/12/2023 - Review Complete 04/21/2021  Allergen Reaction Noted   Penicillins Hives 07/23/2007   Atorvastatin Other (See Comments) 01/16/2016    Review of Systems:    All systems reviewed and negative except where noted in HPI.   Physical Exam:  BP (!) 163/71 (BP Location: Left Arm, Patient Position: Sitting, Cuff Size: Normal)   Pulse 60   Temp 97.8 F (36.6 C) (Oral)   Ht 6' (1.829 m)   Wt 180 lb 8 oz (81.9 kg)   BMI 24.48 kg/m  No LMP for male patient.  General:   Alert,  Well-developed, well-nourished, pleasant and cooperative  in NAD Head:  Normocephalic and atraumatic. Eyes:  Sclera clear, no icterus.   Conjunctiva pink. Ears:  Normal auditory acuity. Nose:  No deformity, discharge, or lesions. Mouth:  No deformity or lesions,oropharynx pink & moist. Neck:  Supple; no masses or thyromegaly. Lungs:  Respirations even and unlabored.  Clear throughout to auscultation.   No wheezes, crackles, or rhonchi. No acute distress. Heart:  Regular rate and rhythm; no murmurs, clicks, rubs, or gallops. Abdomen:  Normal bowel sounds. Soft, non-tender and non-distended without masses, hepatosplenomegaly or hernias noted.  No guarding or rebound tenderness.   Rectal: Not performed Msk:  Symmetrical without gross  deformities. Good, equal movement & strength bilaterally. Pulses:  Normal pulses noted. Extremities:  No clubbing or edema.  No cyanosis. Neurologic:  Alert and oriented x3;  grossly normal neurologically. Skin:  Intact without significant lesions or rashes. No jaundice. Psych:  Alert and cooperative. Normal mood and affect.  Imaging Studies: Reviewed  Assessment and Plan:   JOSEFF LUCKMAN is a 67 y.o. male with history of chronic tobacco use, diabetes, hypertension, hyperlipidemia, chronic back pain, coronary artery disease s/p CABG, depression is seen in consultation for imaging evidence of cirrhosis of liver  Cirrhosis of liver, well compensated Recommend secondary liver disease workup Recommend EGD for variceal screening Discussed about low-sodium diet, information provided Check PT/INR, serum AFP levels  Colon cancer screening Recommend screening colonoscopy with Sutab  Follow up in 6 months   [name], MD

## 2023-03-13 LAB — HEPATITIS B SURFACE ANTIBODY,QUALITATIVE: Hep B Surface Ab, Qual: NONREACTIVE

## 2023-03-13 LAB — ALPHA-1-ANTITRYPSIN: A-1 Antitrypsin: 102 mg/dL (ref 101–187)

## 2023-03-13 LAB — ANTI-SMOOTH MUSCLE ANTIBODY, IGG: Smooth Muscle Ab: 5 Units (ref 0–19)

## 2023-03-13 LAB — HEPATITIS B SURFACE ANTIGEN: Hepatitis B Surface Ag: NEGATIVE

## 2023-03-14 LAB — COMPREHENSIVE METABOLIC PANEL
ALT: 24 IU/L (ref 0–44)
AST: 22 IU/L (ref 0–40)
Albumin/Globulin Ratio: 1.6 (ref 1.2–2.2)
Albumin: 4.3 g/dL (ref 3.9–4.9)
Alkaline Phosphatase: 142 IU/L — ABNORMAL HIGH (ref 44–121)
BUN/Creatinine Ratio: 14 (ref 10–24)
BUN: 14 mg/dL (ref 8–27)
Bilirubin Total: 0.5 mg/dL (ref 0.0–1.2)
CO2: 25 mmol/L (ref 20–29)
Calcium: 9 mg/dL (ref 8.6–10.2)
Chloride: 102 mmol/L (ref 96–106)
Creatinine, Ser: 1.01 mg/dL (ref 0.76–1.27)
Globulin, Total: 2.7 g/dL (ref 1.5–4.5)
Glucose: 236 mg/dL — ABNORMAL HIGH (ref 70–99)
Potassium: 3.7 mmol/L (ref 3.5–5.2)
Sodium: 143 mmol/L (ref 134–144)
Total Protein: 7 g/dL (ref 6.0–8.5)
eGFR: 83 mL/min/{1.73_m2} (ref >59)

## 2023-03-14 LAB — CBC
Hematocrit: 45.5 % (ref 37.5–51.0)
Hemoglobin: 15.3 g/dL (ref 13.0–17.7)
MCH: 32.1 pg (ref 26.6–33.0)
MCHC: 33.6 g/dL (ref 31.5–35.7)
MCV: 96 fL (ref 79–97)
Platelets: 171 10*3/uL (ref 150–450)
RBC: 4.76 x10E6/uL (ref 4.14–5.80)
RDW: 12.3 % (ref 11.6–15.4)
WBC: 8.2 10*3/uL (ref 3.4–10.8)

## 2023-03-14 LAB — IRON,TIBC AND FERRITIN PANEL
Ferritin: 32 ng/mL (ref 30–400)
Iron Saturation: 27 % (ref 15–55)
Iron: 92 ug/dL (ref 38–169)
Total Iron Binding Capacity: 335 ug/dL (ref 250–450)
UIBC: 243 ug/dL (ref 111–343)

## 2023-03-14 LAB — AFP TUMOR MARKER: AFP, Serum, Tumor Marker: 4.1 ng/mL (ref 0.0–8.4)

## 2023-03-14 LAB — PROTIME-INR
INR: 1 (ref 0.9–1.2)
Prothrombin Time: 11.1 s (ref 9.1–12.0)

## 2023-03-14 LAB — HEPATITIS C ANTIBODY: Hep C Virus Ab: NONREACTIVE

## 2023-03-14 LAB — ANA: ANA Titer 1: NEGATIVE

## 2023-03-14 LAB — HEPATITIS A ANTIBODY, TOTAL: hep A Total Ab: NEGATIVE

## 2023-03-14 LAB — HEPATITIS B CORE ANTIBODY, TOTAL: Hep B Core Total Ab: NEGATIVE

## 2023-03-14 LAB — MITOCHONDRIAL ANTIBODIES: Mitochondrial Ab: <20 Units (ref 0.0–20.0)

## 2023-03-14 LAB — CERULOPLASMIN: Ceruloplasmin: 25.3 mg/dL (ref 16.0–31.0)

## 2023-03-17 ENCOUNTER — Telehealth: Payer: Self-pay

## 2023-03-17 DIAGNOSIS — K7460 Unspecified cirrhosis of liver: Secondary | ICD-10-CM

## 2023-03-17 NOTE — Telephone Encounter (Signed)
-----   Message from Shane Reil, MD sent at 03/14/2023 11:44 AM EDT ----- Please inform patient that one of the liver enzymes came back mildly elevated.  However, all other tests to look for the cause of cirrhosis came back normal.  Recommend to check vitamin D levels, fibrosis panel.  Follow-up in 6 months as planned  RV

## 2023-03-17 NOTE — Telephone Encounter (Signed)
Patient verbalized understanding of results. He states he will come for labs today or tomorrow

## 2023-03-18 DIAGNOSIS — K7460 Unspecified cirrhosis of liver: Secondary | ICD-10-CM | POA: Diagnosis not present

## 2023-03-19 ENCOUNTER — Encounter: Payer: Self-pay | Admitting: Gastroenterology

## 2023-03-19 LAB — NASH FIBROSURE(R) PLUS

## 2023-03-20 ENCOUNTER — Telehealth: Payer: Self-pay

## 2023-03-20 LAB — NASH FIBROSURE(R) PLUS
AST (SGOT) P5P: 28 IU/L (ref 0–40)
Apolipoprotein A-1: 114 mg/dL (ref 101–178)
Bilirubin, Total: 0.5 mg/dL (ref 0.0–1.2)
Fibrosis Score: 0.83 — ABNORMAL HIGH (ref 0.00–0.21)
Haptoglobin: 144 mg/dL (ref 32–363)
NASH Score: 0.71 — ABNORMAL HIGH (ref 0.00–0.25)
Steatosis Score: 0.78 — ABNORMAL HIGH (ref 0.00–0.40)
Triglycerides: 233 mg/dL — ABNORMAL HIGH (ref 0–149)

## 2023-03-20 LAB — VITAMIN D 25 HYDROXY (VIT D DEFICIENCY, FRACTURES): Vit D, 25-Hydroxy: 21.7 ng/mL — ABNORMAL LOW (ref 30.0–100.0)

## 2023-03-20 MED ORDER — VITAMIN D (ERGOCALCIFEROL) 1.25 MG (50000 UNIT) PO CAPS: 50000.0000 [IU] | ORAL_CAPSULE | ORAL | 0 refills | Status: DC

## 2023-03-20 NOTE — Telephone Encounter (Signed)
-----   Message from [name], MD sent at 03/20/2023  1:08 PM EDT ----- Mild vitamin D deficiency, recommend vitamin D 50 K once a week for 12 weeks followed by daily vitamin D 400 units over-the-counter  RV

## 2023-03-20 NOTE — Telephone Encounter (Signed)
Patient verbalized understanding of results  

## 2023-03-25 ENCOUNTER — Encounter: Payer: Self-pay | Admitting: Gastroenterology

## 2023-03-26 ENCOUNTER — Telehealth: Payer: Self-pay

## 2023-03-26 ENCOUNTER — Ambulatory Visit: Payer: Medicare HMO | Admitting: Certified Registered"

## 2023-03-26 ENCOUNTER — Encounter
Admission: RE | Disposition: A | Discharge status: Home / Self Care | Payer: Self-pay | Source: Home / Self Care | Attending: Gastroenterology

## 2023-03-26 ENCOUNTER — Other Ambulatory Visit: Payer: Self-pay

## 2023-03-26 ENCOUNTER — Ambulatory Visit
Admission: RE | Admit: 2023-03-26 | Discharge: 2023-03-26 | Disposition: A | Discharge status: Home / Self Care | Payer: Medicare HMO | Attending: Gastroenterology | Admitting: Gastroenterology

## 2023-03-26 ENCOUNTER — Encounter: Payer: Self-pay | Admitting: Gastroenterology

## 2023-03-26 DIAGNOSIS — J449 Chronic obstructive pulmonary disease, unspecified: Secondary | ICD-10-CM | POA: Diagnosis not present

## 2023-03-26 DIAGNOSIS — E1151 Type 2 diabetes mellitus with diabetic peripheral angiopathy without gangrene: Secondary | ICD-10-CM | POA: Insufficient documentation

## 2023-03-26 DIAGNOSIS — K2950 Unspecified chronic gastritis without bleeding: Secondary | ICD-10-CM | POA: Diagnosis not present

## 2023-03-26 DIAGNOSIS — E785 Hyperlipidemia, unspecified: Secondary | ICD-10-CM | POA: Insufficient documentation

## 2023-03-26 DIAGNOSIS — Z951 Presence of aortocoronary bypass graft: Secondary | ICD-10-CM | POA: Diagnosis not present

## 2023-03-26 DIAGNOSIS — B9681 Helicobacter pylori [H. pylori] as the cause of diseases classified elsewhere: Secondary | ICD-10-CM | POA: Insufficient documentation

## 2023-03-26 DIAGNOSIS — K635 Polyp of colon: Secondary | ICD-10-CM | POA: Diagnosis not present

## 2023-03-26 DIAGNOSIS — D124 Benign neoplasm of descending colon: Secondary | ICD-10-CM | POA: Insufficient documentation

## 2023-03-26 DIAGNOSIS — Z1211 Encounter for screening for malignant neoplasm of colon: Secondary | ICD-10-CM

## 2023-03-26 DIAGNOSIS — K2900 Acute gastritis without bleeding: Secondary | ICD-10-CM | POA: Diagnosis not present

## 2023-03-26 DIAGNOSIS — I8510 Secondary esophageal varices without bleeding: Secondary | ICD-10-CM

## 2023-03-26 DIAGNOSIS — K3189 Other diseases of stomach and duodenum: Secondary | ICD-10-CM | POA: Diagnosis not present

## 2023-03-26 DIAGNOSIS — K7469 Other cirrhosis of liver: Secondary | ICD-10-CM

## 2023-03-26 DIAGNOSIS — D126 Benign neoplasm of colon, unspecified: Secondary | ICD-10-CM | POA: Diagnosis not present

## 2023-03-26 DIAGNOSIS — I10 Essential (primary) hypertension: Secondary | ICD-10-CM | POA: Diagnosis not present

## 2023-03-26 DIAGNOSIS — Z8711 Personal history of peptic ulcer disease: Secondary | ICD-10-CM | POA: Insufficient documentation

## 2023-03-26 DIAGNOSIS — I2510 Atherosclerotic heart disease of native coronary artery without angina pectoris: Secondary | ICD-10-CM | POA: Insufficient documentation

## 2023-03-26 DIAGNOSIS — K219 Gastro-esophageal reflux disease without esophagitis: Secondary | ICD-10-CM | POA: Insufficient documentation

## 2023-03-26 DIAGNOSIS — K319 Disease of stomach and duodenum, unspecified: Secondary | ICD-10-CM | POA: Diagnosis not present

## 2023-03-26 DIAGNOSIS — K7460 Unspecified cirrhosis of liver: Secondary | ICD-10-CM | POA: Insufficient documentation

## 2023-03-26 DIAGNOSIS — Z87891 Personal history of nicotine dependence: Secondary | ICD-10-CM | POA: Diagnosis not present

## 2023-03-26 HISTORY — PX: ESOPHAGOGASTRODUODENOSCOPY (EGD) WITH PROPOFOL: SHX5813

## 2023-03-26 HISTORY — PX: COLONOSCOPY WITH PROPOFOL: SHX5780

## 2023-03-26 SURGERY — COLONOSCOPY WITH PROPOFOL
Anesthesia: General

## 2023-03-26 MED ORDER — PROPOFOL 500 MG/50ML IV EMUL
INTRAVENOUS | Status: DC | PRN
  Administered 2023-03-26: 120 ug/kg/min via INTRAVENOUS

## 2023-03-26 MED ORDER — SODIUM CHLORIDE 0.9 % IV SOLN: INTRAVENOUS | Status: DC

## 2023-03-26 MED ORDER — LIDOCAINE HCL (CARDIAC) PF 100 MG/5ML IV SOSY
PREFILLED_SYRINGE | INTRAVENOUS | Status: DC | PRN
  Administered 2023-03-26: 80 mg via INTRAVENOUS

## 2023-03-26 MED ORDER — PROPOFOL 10 MG/ML IV BOLUS
INTRAVENOUS | Status: DC | PRN
  Administered 2023-03-26: 90 mg via INTRAVENOUS

## 2023-03-26 MED ORDER — SUTAB 1479-225-188 MG PO TABS: 12.0000 | ORAL_TABLET | Freq: Once | ORAL | 0 refills | Status: AC

## 2023-03-26 MED ORDER — PROPOFOL 10 MG/ML IV BOLUS
INTRAVENOUS | Status: AC
  Filled 2023-03-26: qty 40

## 2023-03-26 NOTE — Telephone Encounter (Signed)
Per Dr. Allegra Lai he had poor prep, he said he will come back tomorrow, can he get sutab again.   Sent Sutab to the pharmacy at [facility]

## 2023-03-26 NOTE — H&P (Signed)
[name], MD [address]  [address]  [place], [place] [address]  Main: [phone]  Fax: [phone] Pager: [phone]  Primary Care Physician:  [name], PA-C Primary Gastroenterologist:  Dr. [name]  Pre-Procedure History & Physical: HPI:  [name] is a 67 y.o. male is here for an endoscopy and colonoscopy.   Past Medical History:  Diagnosis Date   Anginal pain (HCC)    Arthritis    Bronchitis    h/o   Coronary artery disease    Depression    Diabetes mellitus without complication (HCC)    diet controlled   Dyspnea    with exertion   GERD (gastroesophageal reflux disease)    History of MRSA infection 2010   Hyperlipidemia    Hypertension    Insomnia    Peripheral vascular disease (HCC)    Stomach ulcer     Past Surgical History:  Procedure Laterality Date   ANTERIOR CERVICAL DECOMP/DISCECTOMY FUSION N/A 01/28/2018   Procedure: ANTERIOR CERVICAL DECOMPRESSION/DISCECTOMY FUSION 2 LEVELS C3-5;  Surgeon: [name], MD;  Location: [facility] ORS;  Service: Neurosurgery;  Laterality: N/A;   COLON SURGERY     due to diverticulitis   CORONARY ARTERY BYPASS GRAFT     LUMBAR LAMINECTOMY/DECOMPRESSION MICRODISCECTOMY N/A 09/01/2019   Procedure: LUMBAR LAMINECTOMY/DECOMPRESSION MICRODISCECTOMY 3 LEVELS L2-L5;  Surgeon: [name], MD;  Location: [facility] ORS;  Service: Neurosurgery;  Laterality: N/A;   neck fusion     SHOULDER ARTHROSCOPY WITH OPEN ROTATOR CUFF REPAIR Right 04/08/2016   Procedure: SHOULDER ARTHROSCOPY WITH OPEN ROTATOR CUFF REPAIR;  Surgeon: [name], MD;  Location: [facility] ORS;  Service: Orthopedics;  Laterality: Right;   TONSILLECTOMY     TRANSFORAMINAL LUMBAR INTERBODY FUSION W/ MIS 1 LEVEL N/A 09/01/2019   Procedure: MINIMALLY INVASIVE (MIS) TRANSFORAMINAL LUMBAR INTERBODY FUSION (TLIF) 1 LEVEL L5-S1;  Surgeon: [name], MD;  Location: [facility] ORS;  Service: Neurosurgery;  Laterality: N/A;    Prior to  Admission medications   Medication Sig Start Date End Date Taking? Authorizing Provider  Vitamin D, Ergocalciferol, (DRISDOL) 1.25 MG (50000 UNIT) CAPS capsule Take 1 capsule (50,000 Units total) by mouth every 7 (seven) days. 03/20/23   [name], MD  albuterol (VENTOLIN HFA) 108 (90 Base) MCG/ACT inhaler Inhale 1-2 puffs into the lungs every 6 (six) hours as needed for wheezing or shortness of breath.    [provider]  amitriptyline (ELAVIL) 50 MG tablet Take 50 mg by mouth at bedtime.    [provider]  citalopram (CELEXA) 40 MG tablet Take 40 mg by mouth every morning.     [provider]  DULoxetine (CYMBALTA) 60 MG capsule Take 60 mg by mouth 2 (two) times daily.    [provider]  fluticasone (FLONASE) 50 MCG/ACT nasal spray Place 2 sprays into both nostrils daily.    [provider]  fluticasone (FLOVENT DISKUS) 50 MCG/BLIST diskus inhaler Inhale 1 puff into the lungs 2 (two) times daily as needed (for respiratory issues.).     [provider]  isosorbide mononitrate (IMDUR) 30 MG 24 hr tablet Take 30 mg by mouth daily. 12/07/20   [provider]  lansoprazole (PREVACID) 15 MG capsule Take 15 mg by mouth daily before breakfast.     [provider]  lisinopril (ZESTRIL) 40 MG tablet Take 40 mg by mouth daily. 01/16/21   [provider]  meloxicam (MOBIC) 15 MG tablet Take 15 mg by mouth daily. 02/08/20  [provider]  nitroGLYCERIN (NITROSTAT) 0.4 MG SL tablet Place 0.4 mg under the tongue every 5 (five) minutes as needed for chest pain.    [provider]  omeprazole (PRILOSEC OTC) 20 MG tablet Take 20 mg by mouth every morning.    [provider]  pregabalin (LYRICA) 300 MG capsule Take 300 mg by mouth 2 (two) times daily. 03/06/23   [provider]  rosuvastatin (CRESTOR) 20 MG tablet Take 20 mg by mouth daily. 12/06/19   [provider]  tamsulosin  (FLOMAX) 0.4 MG CAPS capsule Take 0.4 mg by mouth daily after supper.    [provider]  tiZANidine (ZANAFLEX) 4 MG tablet Take 1 tablet (4 mg total) by mouth every 6 (six) hours as needed. 09/02/19   [name], PA-C  valsartan (DIOVAN) 320 MG tablet Take 320 mg by mouth daily. 01/23/23   [provider]    Allergies as of 03/12/2023 - Review Complete 03/12/2023  Allergen Reaction Noted   Penicillins Hives 07/23/2007   Atorvastatin Other (See Comments) 01/16/2016    History reviewed. No pertinent family history.  Social History   Socioeconomic History   Marital status: Married    Spouse name: Not on file   Number of children: Not on file   Years of education: Not on file   Highest education level: Not on file  Occupational History   Not on file  Tobacco Use   Smoking status: Former    Packs/day: 1.00    Years: 50.00    Additional pack years: 0.00    Total pack years: 50.00    Types: Cigarettes    Quit date: 02/07/2018    Years since quitting: 5.1   Smokeless tobacco: Never  Vaping Use   Vaping Use: Former  Substance and Sexual Activity   Alcohol use: Not Currently    Comment: rarely   Drug use: No   Sexual activity: Yes  Other Topics Concern   Not on file  Social History Narrative   Not on file   Social Determinants of Health   Financial Resource Strain: Not on file  Food Insecurity: Not on file  Transportation Needs: Not on file  Physical Activity: Not on file  Stress: Not on file  Social Connections: Not on file  Intimate Partner Violence: Not on file    Review of Systems: See HPI, otherwise negative ROS  Physical Exam: BP (!) 158/104   Pulse (!) 55   Temp 97.9 F (36.6 C) (Temporal)   Resp 16   Ht 6' (1.829 m)   Wt 78.5 kg   SpO2 97%   BMI 23.46 kg/m  General:   Alert,  pleasant and cooperative in NAD Head:  Normocephalic and atraumatic. Neck:  Supple; no masses or thyromegaly. Lungs:  Clear throughout to auscultation.     Heart:  Regular rate and rhythm. Abdomen:  Soft, nontender and nondistended. Normal bowel sounds, without guarding, and without rebound.   Neurologic:  Alert and  oriented x4;  grossly normal neurologically.  Impression/Plan: [name] is here for an endoscopy and colonoscopy to be performed for colon cancer screening, cirrhosis of liver, EGD for variceal screening   Risks, benefits, limitations, and alternatives regarding  endoscopy and colonoscopy have been reviewed with the patient.  Questions have been answered.  All parties agreeable.   Lannette Donath, MD  03/26/2023, 8:58 AM

## 2023-03-26 NOTE — Op Note (Signed)
[facility] Gastroenterology Patient Name: [name] Procedure Date: 03/26/2023 8:56 AM MRN: [id number] Account #: [id number] Date of Birth: [birth year] Admit Type: Outpatient Age: 67 Room: [facility] ENDO ROOM 4 Gender: Male Note Status: Finalized Instrument Name: Upper Endoscope [id number] Procedure:             Upper GI endoscopy Indications:           Cirrhosis rule out esophageal varices Providers:             [name] MD, MD Referring MD:          [name] (Referring MD) Medicines:             General Anesthesia Complications:         No immediate complications. Estimated blood loss: None. Procedure:             Pre-Anesthesia Assessment:                        - Prior to the procedure, a History and Physical was                         performed, and patient medications and allergies were                         reviewed. The patient is competent. The risks and                         benefits of the procedure and the sedation options and                         risks were discussed with the patient. All questions                         were answered and informed consent was obtained.                         Patient identification and proposed procedure were                         verified by the physician, the nurse, the                         anesthesiologist, the anesthetist and the technician                         in the pre-procedure area in the procedure room in the                         endoscopy suite. Mental Status Examination: alert and                         oriented. Airway Examination: normal oropharyngeal                         airway and neck mobility. Respiratory Examination:                         clear to auscultation. CV Examination: normal.  Prophylactic Antibiotics: The patient does not require                         prophylactic antibiotics. Prior Anticoagulants: The                          patient has taken no anticoagulant or antiplatelet                         agents. ASA Grade Assessment: III - A patient with                         severe systemic disease. After reviewing the risks and                         benefits, the patient was deemed in satisfactory                         condition to undergo the procedure. The anesthesia                         plan was to use general anesthesia. Immediately prior                         to administration of medications, the patient was                         re-assessed for adequacy to receive sedatives. The                         heart rate, respiratory rate, oxygen saturations,                         blood pressure, adequacy of pulmonary ventilation, and                         response to care were monitored throughout the                         procedure. The physical status of the patient was                         re-assessed after the procedure.                        After obtaining informed consent, the endoscope was                         passed under direct vision. Throughout the procedure,                         the patient's blood pressure, pulse, and oxygen                         saturations were monitored continuously. The Endoscope                         was introduced through the mouth, and advanced to the  second part of duodenum. The upper GI endoscopy was                         accomplished without difficulty. The patient tolerated                         the procedure well. Findings:      The duodenal bulb and second portion of the duodenum were normal.      Diffuse mildly erythematous mucosa without bleeding was found in the       entire examined stomach. Biopsies were taken with a cold forceps for       histology.      The cardia and gastric fundus were normal on retroflexion.      Esophagogastric landmarks were identified: the gastroesophageal junction       was found  at 39 cm from the incisors.      The gastroesophageal junction and examined esophagus were normal. Impression:            - Normal duodenal bulb and second portion of the                         duodenum.                        - Erythematous mucosa in the stomach. Biopsied.                        - Esophagogastric landmarks identified.                        - Normal gastroesophageal junction and esophagus. Recommendation:        - Await pathology results.                        - Proceed with colonoscopy as scheduled                        See colonoscopy report Procedure Code(s):     --- Professional ---                        [phone], Esophagogastroduodenoscopy, flexible,                         transoral; with biopsy, single or multiple Diagnosis Code(s):     --- Professional ---                        K31.89, Other diseases of stomach and duodenum                        K74.60, Unspecified cirrhosis of liver CPT copyright 2022 American Medical Association. All rights reserved. The codes documented in this report are preliminary and upon coder review may  be revised to meet current compliance requirements. Dr. Libby Maw [name] MD, MD 03/26/2023 9:20:51 AM This report has been signed electronically. Number of Addenda: 0 Note Initiated On: 03/26/2023 8:56 AM Estimated Blood Loss:  Estimated blood loss: none.      [facility]

## 2023-03-26 NOTE — Anesthesia Preprocedure Evaluation (Signed)
Anesthesia Evaluation  Patient identified by MRN, date of birth, ID band Patient awake    Reviewed: Allergy & Precautions, NPO status , Patient's Chart, lab work & pertinent test results  History of Anesthesia Complications Negative for: history of anesthetic complications  Airway Mallampati: III  TM Distance: <3 FB Neck ROM: full    Dental  (+) Chipped   Pulmonary neg shortness of breath, COPD, former smoker   Pulmonary exam normal        Cardiovascular Exercise Tolerance: Good hypertension, (-) angina + CAD and + Cardiac Stents  Normal cardiovascular exam     Neuro/Psych  PSYCHIATRIC DISORDERS       Neuromuscular disease    GI/Hepatic Neg liver ROS, PUD,GERD  Controlled,,  Endo/Other  negative endocrine ROSdiabetes, Type 2    Renal/GU negative Renal ROS  negative genitourinary   Musculoskeletal   Abdominal   Peds  Hematology negative hematology ROS (+)   Anesthesia Other Findings Patient is NPO appropriate and reports no nausea or vomiting today.  Past Medical History: No date: Anginal pain (HCC) No date: Arthritis No date: Bronchitis     Comment:  h/o No date: Coronary artery disease No date: Depression No date: Diabetes mellitus without complication (HCC)     Comment:  diet controlled No date: Dyspnea     Comment:  with exertion No date: GERD (gastroesophageal reflux disease) 2010: History of MRSA infection No date: Hyperlipidemia No date: Hypertension No date: Insomnia No date: Peripheral vascular disease (HCC) No date: Stomach ulcer  Past Surgical History: 01/28/2018: ANTERIOR CERVICAL DECOMP/DISCECTOMY FUSION; N/A     Comment:  Procedure: ANTERIOR CERVICAL DECOMPRESSION/DISCECTOMY               FUSION 2 LEVELS C3-5;  Surgeon: Yarbrough, Chester, MD;                Location: [facility] ORS;  Service: Neurosurgery;  Laterality:               N/A; No date: COLON SURGERY     Comment:  due to  diverticulitis No date: CORONARY ARTERY BYPASS GRAFT 09/01/2019: LUMBAR LAMINECTOMY/DECOMPRESSION MICRODISCECTOMY; N/A     Comment:  Procedure: LUMBAR LAMINECTOMY/DECOMPRESSION               MICRODISCECTOMY 3 LEVELS L2-L5;  Surgeon: Yarbrough,               Chester, MD;  Location: [facility] ORS;  Service: Neurosurgery;              Laterality: N/A; No date: neck fusion 04/08/2016: SHOULDER ARTHROSCOPY WITH OPEN ROTATOR CUFF REPAIR; Right     Comment:  Procedure: SHOULDER ARTHROSCOPY WITH OPEN ROTATOR CUFF               REPAIR;  Surgeon: Howard Miller, MD;  Location: [facility] ORS;              Service: Orthopedics;  Laterality: Right; No date: TONSILLECTOMY 09/01/2019: TRANSFORAMINAL LUMBAR INTERBODY FUSION W/ MIS 1 LEVEL; N/A     Comment:  Procedure: MINIMALLY INVASIVE (MIS) TRANSFORAMINAL               LUMBAR INTERBODY FUSION (TLIF) 1 LEVEL L5-S1;  Surgeon:               Yarbrough, Chester, MD;  Location: [facility] ORS;  Service:               Neurosurgery;  Laterality: N/A;  BMI    Body Mass Index:   23.46 kg/m      Reproductive/Obstetrics negative OB ROS                              Anesthesia Physical Anesthesia Plan  ASA: 3  Anesthesia Plan: General   Post-op Pain Management:    Induction: Intravenous  PONV Risk Score and Plan: Propofol infusion and TIVA  Airway Management Planned: Natural Airway and Nasal Cannula  Additional Equipment:   Intra-op Plan:   Post-operative Plan:   Informed Consent: I have reviewed the patients History and Physical, chart, labs and discussed the procedure including the risks, benefits and alternatives for the proposed anesthesia with the patient or authorized representative who has indicated his/her understanding and acceptance.     Dental Advisory Given  Plan Discussed with: Anesthesiologist, CRNA and Surgeon  Anesthesia Plan Comments: (Patient consented for risks of anesthesia including but not limited to:  - adverse  reactions to medications - risk of airway placement if required - damage to eyes, teeth, lips or other oral mucosa - nerve damage due to positioning  - sore throat or hoarseness - Damage to heart, brain, nerves, lungs, other parts of body or loss of life  Patient voiced understanding.)        Anesthesia Quick Evaluation  

## 2023-03-26 NOTE — Transfer of Care (Signed)
Immediate Anesthesia Transfer of Care Note  Patient: [name]  Procedure(s) Performed: COLONOSCOPY WITH PROPOFOL ESOPHAGOGASTRODUODENOSCOPY (EGD) WITH PROPOFOL  Patient Location: PACU  Anesthesia Type:General  Level of Consciousness: unresponsive  Airway & Oxygen Therapy: Patient Spontanous Breathing  Post-op Assessment: Report given to RN  Post vital signs: Reviewed and stable  Last Vitals:  Vitals Value Taken Time  BP 101/66 03/26/23 0935  Temp 35.8 C 03/26/23 0935  Pulse 53 03/26/23 0936  Resp 16 03/26/23 0936  SpO2 96 % 03/26/23 0936  Vitals shown include unvalidated device data.  Last Pain:  Vitals:   03/26/23 0935  TempSrc:   PainSc: Asleep         Complications: No notable events documented.

## 2023-03-26 NOTE — Op Note (Signed)
[facility] Gastroenterology Patient Name: [name] Procedure Date: 03/26/2023 8:55 AM MRN: [id number] Account #: [id number] Date of Birth: 09/11/1956 Admit Type: Outpatient Age: 67 Room: [facility] ENDO ROOM 4 Gender: Male Note Status: Finalized Instrument Name: Prentice Docker [id number] Procedure:             Colonoscopy Indications:           Screening for colorectal malignant neoplasm, This is                         the patient's first colonoscopy Providers:             [name] MD, MD Referring MD:          [name] (Referring MD) Medicines:             General Anesthesia Complications:         No immediate complications. Estimated blood loss: None. Procedure:             Pre-Anesthesia Assessment:                        - Prior to the procedure, a History and Physical was                         performed, and patient medications and allergies were                         reviewed. The patient is competent. The risks and                         benefits of the procedure and the sedation options and                         risks were discussed with the patient. All questions                         were answered and informed consent was obtained.                         Patient identification and proposed procedure were                         verified by the physician, the nurse, the                         anesthesiologist, the anesthetist and the technician                         in the pre-procedure area in the procedure room in the                         endoscopy suite. Mental Status Examination: alert and                         oriented. Airway Examination: normal oropharyngeal                         airway and neck mobility. Respiratory Examination:  clear to auscultation. CV Examination: normal.                         Prophylactic Antibiotics: The patient does not require                         prophylactic  antibiotics. Prior Anticoagulants: The                         patient has taken no anticoagulant or antiplatelet                         agents. ASA Grade Assessment: III - A patient with                         severe systemic disease. After reviewing the risks and                         benefits, the patient was deemed in satisfactory                         condition to undergo the procedure. The anesthesia                         plan was to use general anesthesia. Immediately prior                         to administration of medications, the patient was                         re-assessed for adequacy to receive sedatives. The                         heart rate, respiratory rate, oxygen saturations,                         blood pressure, adequacy of pulmonary ventilation, and                         response to care were monitored throughout the                         procedure. The physical status of the patient was                         re-assessed after the procedure.                        After obtaining informed consent, the colonoscope was                         passed under direct vision. Throughout the procedure,                         the patient's blood pressure, pulse, and oxygen                         saturations were monitored continuously. The  Colonoscope was introduced through the anus and                         advanced to the the cecum, identified by appendiceal                         orifice and ileocecal valve. The colonoscopy was                         performed without difficulty. The patient tolerated                         the procedure well. The quality of the bowel                         preparation was poor. The ileocecal valve, appendiceal                         orifice, and rectum were photographed. Findings:      The perianal and digital rectal examinations were normal. Pertinent       negatives include normal  sphincter tone and no palpable rectal lesions.      A 5 mm polyp was found in the descending colon. The polyp was sessile.       The polyp was removed with a cold snare. Resection and retrieval were       complete.      Copious quantities of semi-liquid stool was found in the entire colon,       precluding visualization.      The retroflexed view of the distal rectum and anal verge was normal and       showed no anal or rectal abnormalities. Impression:            - Preparation of the colon was poor.                        - One 5 mm polyp in the descending colon, removed with                         a cold snare. Resected and retrieved.                        - Stool in the entire examined colon.                        - The distal rectum and anal verge are normal on                         retroflexion view. Recommendation:        - Discharge patient to home (with escort).                        - Resume previous diet today.                        - Continue present medications.                        - Await pathology results.                        -  Repeat colonoscopy either tomorrow with repeat prep                         or in 6 months with 2 day prep because the bowel                         preparation was suboptimal. Procedure Code(s):     --- Professional ---                        [phone], Colonoscopy, flexible; with removal of                         tumor(s), polyp(s), or other lesion(s) by snare                         technique Diagnosis Code(s):     --- Professional ---                        Z12.11, Encounter for screening for malignant neoplasm                         of colon                        D12.4, Benign neoplasm of descending colon CPT copyright 2022 American Medical Association. All rights reserved. The codes documented in this report are preliminary and upon coder review may  be revised to meet current compliance requirements. Dr. Libby Maw [name] MD, MD 03/26/2023 9:33:59 AM This report has been signed electronically. Number of Addenda: 0 Note Initiated On: 03/26/2023 8:55 AM Scope Withdrawal Time: 0 hours 8 minutes 27 seconds  Total Procedure Duration: 0 hours 9 minutes 32 seconds  Estimated Blood Loss:  Estimated blood loss: none.      [facility]

## 2023-03-26 NOTE — Anesthesia Postprocedure Evaluation (Signed)
Anesthesia Post Note  Patient: [name]  Procedure(s) Performed: COLONOSCOPY WITH PROPOFOL ESOPHAGOGASTRODUODENOSCOPY (EGD) WITH PROPOFOL  Patient location during evaluation: Endoscopy Anesthesia Type: General Level of consciousness: awake and alert Pain management: pain level controlled Vital Signs Assessment: post-procedure vital signs reviewed and stable Respiratory status: spontaneous breathing, nonlabored ventilation, respiratory function stable and patient connected to nasal cannula oxygen Cardiovascular status: blood pressure returned to baseline and stable Postop Assessment: no apparent nausea or vomiting Anesthetic complications: no   No notable events documented.   Last Vitals:  Vitals:   03/26/23 0935 03/26/23 0945  BP: 101/66 138/86  Pulse:    Resp:    Temp: (!) 35.8 C   SpO2:  96%    Last Pain:  Vitals:   03/26/23 0955  TempSrc:   PainSc: 0-No pain                 Cleda Mccreedy Darria Corvera

## 2023-03-27 ENCOUNTER — Ambulatory Visit: Payer: Medicare HMO | Admitting: Anesthesiology

## 2023-03-27 ENCOUNTER — Ambulatory Visit
Admission: RE | Admit: 2023-03-27 | Discharge: 2023-03-27 | Disposition: A | Discharge status: Home / Self Care | Payer: Medicare HMO | Attending: Gastroenterology | Admitting: Gastroenterology

## 2023-03-27 ENCOUNTER — Encounter
Admission: RE | Disposition: A | Discharge status: Home / Self Care | Payer: Self-pay | Source: Home / Self Care | Attending: Gastroenterology

## 2023-03-27 ENCOUNTER — Encounter: Payer: Self-pay | Admitting: Gastroenterology

## 2023-03-27 DIAGNOSIS — I10 Essential (primary) hypertension: Secondary | ICD-10-CM | POA: Diagnosis not present

## 2023-03-27 DIAGNOSIS — Z955 Presence of coronary angioplasty implant and graft: Secondary | ICD-10-CM | POA: Diagnosis not present

## 2023-03-27 DIAGNOSIS — E785 Hyperlipidemia, unspecified: Secondary | ICD-10-CM | POA: Insufficient documentation

## 2023-03-27 DIAGNOSIS — J449 Chronic obstructive pulmonary disease, unspecified: Secondary | ICD-10-CM | POA: Diagnosis not present

## 2023-03-27 DIAGNOSIS — E1151 Type 2 diabetes mellitus with diabetic peripheral angiopathy without gangrene: Secondary | ICD-10-CM | POA: Diagnosis not present

## 2023-03-27 DIAGNOSIS — I2510 Atherosclerotic heart disease of native coronary artery without angina pectoris: Secondary | ICD-10-CM | POA: Diagnosis not present

## 2023-03-27 DIAGNOSIS — K219 Gastro-esophageal reflux disease without esophagitis: Secondary | ICD-10-CM | POA: Diagnosis not present

## 2023-03-27 DIAGNOSIS — K621 Rectal polyp: Secondary | ICD-10-CM

## 2023-03-27 DIAGNOSIS — Z8711 Personal history of peptic ulcer disease: Secondary | ICD-10-CM | POA: Diagnosis not present

## 2023-03-27 DIAGNOSIS — Z1211 Encounter for screening for malignant neoplasm of colon: Secondary | ICD-10-CM | POA: Diagnosis not present

## 2023-03-27 DIAGNOSIS — Z87891 Personal history of nicotine dependence: Secondary | ICD-10-CM | POA: Diagnosis not present

## 2023-03-27 DIAGNOSIS — D128 Benign neoplasm of rectum: Secondary | ICD-10-CM | POA: Insufficient documentation

## 2023-03-27 DIAGNOSIS — K635 Polyp of colon: Secondary | ICD-10-CM | POA: Diagnosis not present

## 2023-03-27 HISTORY — PX: COLONOSCOPY: SHX5424

## 2023-03-27 SURGERY — COLONOSCOPY
Anesthesia: General

## 2023-03-27 MED ORDER — SODIUM CHLORIDE 0.9 % IV SOLN: INTRAVENOUS | Status: DC

## 2023-03-27 MED ORDER — LIDOCAINE HCL (CARDIAC) PF 100 MG/5ML IV SOSY
PREFILLED_SYRINGE | INTRAVENOUS | Status: DC | PRN
  Administered 2023-03-27: 50 mg via INTRAVENOUS

## 2023-03-27 MED ORDER — PROPOFOL 10 MG/ML IV BOLUS
INTRAVENOUS | Status: DC | PRN
  Administered 2023-03-27: 80 mg via INTRAVENOUS

## 2023-03-27 MED ORDER — PROPOFOL 10 MG/ML IV BOLUS
INTRAVENOUS | Status: AC
  Filled 2023-03-27: qty 20

## 2023-03-27 MED ORDER — PROPOFOL 500 MG/50ML IV EMUL
INTRAVENOUS | Status: DC | PRN
  Administered 2023-03-27: 100 ug/kg/min via INTRAVENOUS

## 2023-03-27 NOTE — H&P (Signed)
[name], MD [address]  [address]  [place], [place] [address]  Main: [phone]  Fax: [phone] Pager: [phone]  Primary Care Physician:  [name], PA-C Primary Gastroenterologist:  Dr. [name]  Pre-Procedure History & Physical: HPI:  [name] is a 67 y.o. male is here for an colonoscopy.   Past Medical History:  Diagnosis Date   Anginal pain (HCC)    Arthritis    Bronchitis    h/o   Coronary artery disease    Depression    Diabetes mellitus without complication (HCC)    diet controlled   Dyspnea    with exertion   GERD (gastroesophageal reflux disease)    History of MRSA infection 2010   Hyperlipidemia    Hypertension    Insomnia    Peripheral vascular disease (HCC)    Stomach ulcer     Past Surgical History:  Procedure Laterality Date   ANTERIOR CERVICAL DECOMP/DISCECTOMY FUSION N/A 01/28/2018   Procedure: ANTERIOR CERVICAL DECOMPRESSION/DISCECTOMY FUSION 2 LEVELS C3-5;  Surgeon: [name], MD;  Location: [facility];  Service: Neurosurgery;  Laterality: N/A;   COLON SURGERY     due to diverticulitis   CORONARY ARTERY BYPASS GRAFT     LUMBAR LAMINECTOMY/DECOMPRESSION MICRODISCECTOMY N/A 09/01/2019   Procedure: LUMBAR LAMINECTOMY/DECOMPRESSION MICRODISCECTOMY 3 LEVELS L2-L5;  Surgeon: [name], MD;  Location: [facility];  Service: Neurosurgery;  Laterality: N/A;   neck fusion     SHOULDER ARTHROSCOPY WITH OPEN ROTATOR CUFF REPAIR Right 04/08/2016   Procedure: SHOULDER ARTHROSCOPY WITH OPEN ROTATOR CUFF REPAIR;  Surgeon: [name], MD;  Location: [facility];  Service: Orthopedics;  Laterality: Right;   TONSILLECTOMY     TRANSFORAMINAL LUMBAR INTERBODY FUSION W/ MIS 1 LEVEL N/A 09/01/2019   Procedure: MINIMALLY INVASIVE (MIS) TRANSFORAMINAL LUMBAR INTERBODY FUSION (TLIF) 1 LEVEL L5-S1;  Surgeon: [name], MD;  Location: [facility];  Service: Neurosurgery;  Laterality: N/A;    Prior to Admission  medications   Medication Sig Start Date End Date Taking? Authorizing Provider  albuterol (VENTOLIN HFA) 108 (90 Base) MCG/ACT inhaler Inhale 1-2 puffs into the lungs every 6 (six) hours as needed for wheezing or shortness of breath.    [provider]  amitriptyline (ELAVIL) 50 MG tablet Take 50 mg by mouth at bedtime.    [provider]  citalopram (CELEXA) 40 MG tablet Take 40 mg by mouth every morning.     [provider]  DULoxetine (CYMBALTA) 60 MG capsule Take 60 mg by mouth 2 (two) times daily.    [provider]  fluticasone (FLONASE) 50 MCG/ACT nasal spray Place 2 sprays into both nostrils daily.    [provider]  fluticasone (FLOVENT DISKUS) 50 MCG/BLIST diskus inhaler Inhale 1 puff into the lungs 2 (two) times daily as needed (for respiratory issues.).     [provider]  isosorbide mononitrate (IMDUR) 30 MG 24 hr tablet Take 30 mg by mouth daily. 12/07/20   [provider]  lansoprazole (PREVACID) 15 MG capsule Take 15 mg by mouth daily before breakfast.     [provider]  lisinopril (ZESTRIL) 40 MG tablet Take 40 mg by mouth daily. 01/16/21   [provider]  meloxicam (MOBIC) 15 MG tablet Take 15 mg by mouth daily. 02/08/20   [provider]  nitroGLYCERIN (NITROSTAT) 0.4 MG SL tablet Place 0.4 mg under the tongue every 5 (five) minutes as needed for chest pain.    [provider]  omeprazole (PRILOSEC OTC) 20 MG tablet Take 20 mg by mouth every morning.    [provider]  pregabalin (LYRICA) 300 MG capsule Take 300 mg by mouth 2 (two) times daily. 03/06/23   [provider]  rosuvastatin (CRESTOR) 20 MG tablet Take 20 mg by mouth daily. 12/06/19   [provider]  tamsulosin (FLOMAX) 0.4 MG CAPS capsule Take 0.4 mg by mouth daily after supper.    [provider]  tiZANidine (ZANAFLEX) 4 MG tablet Take 1 tablet (4 mg total) by mouth every 6 (six) hours  as needed. 09/02/19   [name], PA-C  valsartan (DIOVAN) 320 MG tablet Take 320 mg by mouth daily. 01/23/23   [provider]  Vitamin D, Ergocalciferol, (DRISDOL) 1.25 MG (50000 UNIT) CAPS capsule Take 1 capsule (50,000 Units total) by mouth every 7 (seven) days. 03/20/23   [name], MD    Allergies as of 03/26/2023 - Review Complete 03/26/2023  Allergen Reaction Noted   Penicillins Hives 07/23/2007   Atorvastatin Other (See Comments) 01/16/2016    History reviewed. No pertinent family history.  Social History   Socioeconomic History   Marital status: Married    Spouse name: Not on file   Number of children: Not on file   Years of education: Not on file   Highest education level: Not on file  Occupational History   Not on file  Tobacco Use   Smoking status: Former    Packs/day: 1.00    Years: 50.00    Additional pack years: 0.00    Total pack years: 50.00    Types: Cigarettes    Quit date: 02/07/2018    Years since quitting: 5.1   Smokeless tobacco: Never  Vaping Use   Vaping Use: Former  Substance and Sexual Activity   Alcohol use: Not Currently    Comment: rarely   Drug use: No   Sexual activity: Yes  Other Topics Concern   Not on file  Social History Narrative   Not on file   Social Determinants of Health   Financial Resource Strain: Not on file  Food Insecurity: Not on file  Transportation Needs: Not on file  Physical Activity: Not on file  Stress: Not on file  Social Connections: Not on file  Intimate Partner Violence: Not on file    Review of Systems: See HPI, otherwise negative ROS  Physical Exam: BP (!) 183/80   Pulse 62   Temp (!) 97.5 F (36.4 C) (Temporal)   Resp 18   SpO2 99%  General:   Alert,  pleasant and cooperative in NAD Head:  Normocephalic and atraumatic. Neck:  Supple; no masses or thyromegaly. Lungs:  Clear throughout to auscultation.    Heart:  Regular rate and rhythm. Abdomen:  Soft, nontender and  nondistended. Normal bowel sounds, without guarding, and without rebound.   Neurologic:  Alert and  oriented x4;  grossly normal neurologically.  Impression/Plan: [name] is here for an colonoscopy to be performed for colon cancer screening  Risks, benefits, limitations, and alternatives regarding  colonoscopy have been reviewed with the patient.  Questions have been answered.  All parties agreeable.   Lannette Donath, MD  03/27/2023, 10:05 AM

## 2023-03-27 NOTE — Transfer of Care (Signed)
Immediate Anesthesia Transfer of Care Note  Patient: [name]  Procedure(s) Performed: COLONOSCOPY  Patient Location: PACU and Endoscopy Unit  Anesthesia Type:General  Level of Consciousness: sedated  Airway & Oxygen Therapy: Patient Spontanous Breathing  Post-op Assessment: Report given to RN and Post -op Vital signs reviewed and stable  Post vital signs: Reviewed and stable  Last Vitals:  Vitals Value Taken Time  BP 107/56   Temp    Pulse 56 03/27/23 1033  Resp 15 03/27/23 1033  SpO2 97 % 03/27/23 1033  Vitals shown include unvalidated device data.  Last Pain:  Vitals:   03/27/23 0945  TempSrc: Temporal         Complications: No notable events documented.

## 2023-03-27 NOTE — Anesthesia Preprocedure Evaluation (Signed)
Anesthesia Evaluation  Patient identified by MRN, date of birth, ID band Patient awake    Reviewed: Allergy & Precautions, NPO status , Patient's Chart, lab work & pertinent test results  History of Anesthesia Complications Negative for: history of anesthetic complications  Airway Mallampati: III  TM Distance: <3 FB Neck ROM: full    Dental  (+) Chipped   Pulmonary neg shortness of breath, COPD, former smoker   Pulmonary exam normal        Cardiovascular Exercise Tolerance: Good hypertension, (-) angina + CAD and + Cardiac Stents  Normal cardiovascular exam     Neuro/Psych  PSYCHIATRIC DISORDERS       Neuromuscular disease    GI/Hepatic Neg liver ROS, PUD,GERD  Controlled,,  Endo/Other  negative endocrine ROSdiabetes, Type 2    Renal/GU negative Renal ROS  negative genitourinary   Musculoskeletal   Abdominal   Peds  Hematology negative hematology ROS (+)   Anesthesia Other Findings Patient is NPO appropriate and reports no nausea or vomiting today.  Past Medical History: No date: Anginal pain (HCC) No date: Arthritis No date: Bronchitis     Comment:  h/o No date: Coronary artery disease No date: Depression No date: Diabetes mellitus without complication (HCC)     Comment:  diet controlled No date: Dyspnea     Comment:  with exertion No date: GERD (gastroesophageal reflux disease) 2010: History of MRSA infection No date: Hyperlipidemia No date: Hypertension No date: Insomnia No date: Peripheral vascular disease (HCC) No date: Stomach ulcer  Past Surgical History: 01/28/2018: ANTERIOR CERVICAL DECOMP/DISCECTOMY FUSION; N/A     Comment:  Procedure: ANTERIOR CERVICAL DECOMPRESSION/DISCECTOMY               FUSION 2 LEVELS C3-5;  Surgeon: [name], MD;                Location: [facility];  Service: Neurosurgery;  Laterality:               N/A; No date: COLON SURGERY     Comment:  due to  diverticulitis No date: CORONARY ARTERY BYPASS GRAFT 09/01/2019: LUMBAR LAMINECTOMY/DECOMPRESSION MICRODISCECTOMY; N/A     Comment:  Procedure: LUMBAR LAMINECTOMY/DECOMPRESSION               MICRODISCECTOMY 3 LEVELS L2-L5;  Surgeon: [name], MD;  Location: [facility];  Service: Neurosurgery;              Laterality: N/A; No date: neck fusion 04/08/2016: SHOULDER ARTHROSCOPY WITH OPEN ROTATOR CUFF REPAIR; Right     Comment:  Procedure: SHOULDER ARTHROSCOPY WITH OPEN ROTATOR CUFF               REPAIR;  Surgeon: [name], MD;  Location: [facility];              Service: Orthopedics;  Laterality: Right; No date: TONSILLECTOMY 09/01/2019: TRANSFORAMINAL LUMBAR INTERBODY FUSION W/ MIS 1 LEVEL; N/A     Comment:  Procedure: MINIMALLY INVASIVE (MIS) TRANSFORAMINAL               LUMBAR INTERBODY FUSION (TLIF) 1 LEVEL L5-S1;  Surgeon:               [name], MD;  Location: [facility];  Service:               Neurosurgery;  Laterality: N/A;  BMI    Body Mass Index:  23.46 kg/m      Reproductive/Obstetrics negative OB ROS                              Anesthesia Physical Anesthesia Plan  ASA: 3  Anesthesia Plan: General   Post-op Pain Management:    Induction: Intravenous  PONV Risk Score and Plan: Propofol infusion and TIVA  Airway Management Planned: Natural Airway and Nasal Cannula  Additional Equipment:   Intra-op Plan:   Post-operative Plan:   Informed Consent: I have reviewed the patients History and Physical, chart, labs and discussed the procedure including the risks, benefits and alternatives for the proposed anesthesia with the patient or authorized representative who has indicated his/her understanding and acceptance.     Dental Advisory Given  Plan Discussed with: Anesthesiologist, CRNA and Surgeon  Anesthesia Plan Comments: (Patient consented for risks of anesthesia including but not limited to:  - adverse  reactions to medications - risk of airway placement if required - damage to eyes, teeth, lips or other oral mucosa - nerve damage due to positioning  - sore throat or hoarseness - Damage to heart, brain, nerves, lungs, other parts of body or loss of life  Patient voiced understanding.)        Anesthesia Quick Evaluation

## 2023-03-27 NOTE — H&P (Signed)
Shane Repress, MD [address]  [address]  [place], [place] [address]  Main: [phone]  Fax: [phone] Pager: [phone]  Primary Care Physician:  [name], PA-C Primary Gastroenterologist:  Dr. Arlyss Wallace  Pre-Procedure History & Physical: HPI:  [name] is a 67 y.o. male is here for an colonoscopy.   Past Medical History:  Diagnosis Date   Anginal pain (HCC)    Arthritis    Bronchitis    h/o   Coronary artery disease    Depression    Diabetes mellitus without complication (HCC)    diet controlled   Dyspnea    with exertion   GERD (gastroesophageal reflux disease)    History of MRSA infection 2010   Hyperlipidemia    Hypertension    Insomnia    Peripheral vascular disease (HCC)    Stomach ulcer     Past Surgical History:  Procedure Laterality Date   ANTERIOR CERVICAL DECOMP/DISCECTOMY FUSION N/A 01/28/2018   Procedure: ANTERIOR CERVICAL DECOMPRESSION/DISCECTOMY FUSION 2 LEVELS C3-5;  Surgeon: [name], MD;  Location: [facility];  Service: Neurosurgery;  Laterality: N/A;   COLON SURGERY     due to diverticulitis   CORONARY ARTERY BYPASS GRAFT     LUMBAR LAMINECTOMY/DECOMPRESSION MICRODISCECTOMY N/A 09/01/2019   Procedure: LUMBAR LAMINECTOMY/DECOMPRESSION MICRODISCECTOMY 3 LEVELS L2-L5;  Surgeon: [name], MD;  Location: [facility];  Service: Neurosurgery;  Laterality: N/A;   neck fusion     SHOULDER ARTHROSCOPY WITH OPEN ROTATOR CUFF REPAIR Right 04/08/2016   Procedure: SHOULDER ARTHROSCOPY WITH OPEN ROTATOR CUFF REPAIR;  Surgeon: [name], MD;  Location: [facility];  Service: Orthopedics;  Laterality: Right;   TONSILLECTOMY     TRANSFORAMINAL LUMBAR INTERBODY FUSION W/ MIS 1 LEVEL N/A 09/01/2019   Procedure: MINIMALLY INVASIVE (MIS) TRANSFORAMINAL LUMBAR INTERBODY FUSION (TLIF) 1 LEVEL L5-S1;  Surgeon: [name], MD;  Location: [facility];  Service: Neurosurgery;  Laterality: N/A;    Prior to Admission  medications   Medication Sig Start Date End Date Taking? Authorizing Provider  albuterol (VENTOLIN HFA) 108 (90 Base) MCG/ACT inhaler Inhale 1-2 puffs into the lungs every 6 (six) hours as needed for wheezing or shortness of breath.    [provider]  amitriptyline (ELAVIL) 50 MG tablet Take 50 mg by mouth at bedtime.    [provider]  citalopram (CELEXA) 40 MG tablet Take 40 mg by mouth every morning.     [provider]  DULoxetine (CYMBALTA) 60 MG capsule Take 60 mg by mouth 2 (two) times daily.    [provider]  fluticasone (FLONASE) 50 MCG/ACT nasal spray Place 2 sprays into both nostrils daily.    [provider]  fluticasone (FLOVENT DISKUS) 50 MCG/BLIST diskus inhaler Inhale 1 puff into the lungs 2 (two) times daily as needed (for respiratory issues.).     [provider]  isosorbide mononitrate (IMDUR) 30 MG 24 hr tablet Take 30 mg by mouth daily. 12/07/20   [provider]  lansoprazole (PREVACID) 15 MG capsule Take 15 mg by mouth daily before breakfast.     [provider]  lisinopril (ZESTRIL) 40 MG tablet Take 40 mg by mouth daily. 01/16/21   [provider]  meloxicam (MOBIC) 15 MG tablet Take 15 mg by mouth daily. 02/08/20   [provider]  nitroGLYCERIN (NITROSTAT) 0.4 MG SL tablet Place 0.4 mg under the tongue every 5 (five) minutes as needed for chest pain.    [provider]  omeprazole (PRILOSEC OTC) 20 MG tablet Take 20 mg by mouth every morning.    [provider]  pregabalin (LYRICA) 300 MG capsule Take 300 mg by mouth 2 (two) times daily. 03/06/23   [provider]  rosuvastatin (CRESTOR) 20 MG tablet Take 20 mg by mouth daily. 12/06/19   [provider]  tamsulosin (FLOMAX) 0.4 MG CAPS capsule Take 0.4 mg by mouth daily after supper.    [provider]  tiZANidine (ZANAFLEX) 4 MG tablet Take 1 tablet (4 mg total) by mouth every 6 (six) hours  as needed. 09/02/19   [name], PA-C  valsartan (DIOVAN) 320 MG tablet Take 320 mg by mouth daily. 01/23/23   [provider]  Vitamin D, Ergocalciferol, (DRISDOL) 1.25 MG (50000 UNIT) CAPS capsule Take 1 capsule (50,000 Units total) by mouth every 7 (seven) days. 03/20/23   [name], MD    Allergies as of 03/26/2023 - Review Complete 03/26/2023  Allergen Reaction Noted   Penicillins Hives 07/23/2007   Atorvastatin Other (See Comments) 01/16/2016    History reviewed. No pertinent family history.  Social History   Socioeconomic History   Marital status: Married    Spouse name: Not on file   Number of children: Not on file   Years of education: Not on file   Highest education level: Not on file  Occupational History   Not on file  Tobacco Use   Smoking status: Former    Packs/day: 1.00    Years: 50.00    Additional pack years: 0.00    Total pack years: 50.00    Types: Cigarettes    Quit date: 02/07/2018    Years since quitting: 5.1   Smokeless tobacco: Never  Vaping Use   Vaping Use: Former  Substance and Sexual Activity   Alcohol use: Not Currently    Comment: rarely   Drug use: No   Sexual activity: Yes  Other Topics Concern   Not on file  Social History Narrative   Not on file   Social Determinants of Health   Financial Resource Strain: Not on file  Food Insecurity: Not on file  Transportation Needs: Not on file  Physical Activity: Not on file  Stress: Not on file  Social Connections: Not on file  Intimate Partner Violence: Not on file    Review of Systems: See HPI, otherwise negative ROS  Physical Exam: BP (!) 183/80   Pulse 62   Temp (!) 97.5 F (36.4 C) (Temporal)   Resp 18   SpO2 99%  General:   Alert,  pleasant and cooperative in NAD Head:  Normocephalic and atraumatic. Neck:  Supple; no masses or thyromegaly. Lungs:  Clear throughout to auscultation.    Heart:  Regular rate and rhythm. Abdomen:  Soft, nontender and  nondistended. Normal bowel sounds, without guarding, and without rebound.   Neurologic:  Alert and  oriented x4;  grossly normal neurologically.  Impression/Plan: RUFORD DUDZINSKI is here for an colonoscopy to be performed for colon cancer screening  Risks, benefits, limitations, and alternatives regarding  colonoscopy have been reviewed with the patient.  Questions have been answered.  All parties agreeable.   Lannette Donath, MD  03/27/2023, 10:10 AM

## 2023-03-27 NOTE — Anesthesia Postprocedure Evaluation (Signed)
Anesthesia Post Note  Patient: [name]  Procedure(s) Performed: COLONOSCOPY  Patient location during evaluation: Endoscopy Anesthesia Type: General Level of consciousness: awake and alert Pain management: pain level controlled Vital Signs Assessment: post-procedure vital signs reviewed and stable Respiratory status: spontaneous breathing, nonlabored ventilation, respiratory function stable and patient connected to nasal cannula oxygen Cardiovascular status: blood pressure returned to baseline and stable Postop Assessment: no apparent nausea or vomiting Anesthetic complications: no  No notable events documented.   Last Vitals:  Vitals:   03/27/23 1043 03/27/23 1053  BP: 110/69 (!) 130/91  Pulse: (!) 54 (!) 51  Resp: 16 15  Temp:    SpO2: 94% 96%    Last Pain:  Vitals:   03/27/23 1053  TempSrc:   PainSc: 0-No pain                 Stephanie Coup

## 2023-03-27 NOTE — Op Note (Signed)
[facility] Gastroenterology Patient Name: [name] Procedure Date: 03/27/2023 9:59 AM MRN: [id number] Account #: [id number] Date of Birth: 1955-12-01 Admit Type: Outpatient Age: 67 Room: [facility] ENDO ROOM 3 Gender: Male Note Status: Finalized Instrument Name: Nelda Marseille [id number] Procedure:             Colonoscopy Indications:           Screening for colorectal malignant neoplasm, Screening                         for colorectal malignant neoplasm, inadequate bowel                         prep on last colonoscopy (more recent than 10 years                         ago) Providers:             [name] MD, MD Medicines:             General Anesthesia Complications:         No immediate complications. Estimated blood loss: None. Procedure:             Pre-Anesthesia Assessment:                        - Prior to the procedure, a History and Physical was                         performed, and patient medications and allergies were                         reviewed. The patient is competent. The risks and                         benefits of the procedure and the sedation options and                         risks were discussed with the patient. All questions                         were answered and informed consent was obtained.                         Patient identification and proposed procedure were                         verified by the physician, the nurse, the                         anesthesiologist, the anesthetist and the technician                         in the pre-procedure area in the procedure room in the                         endoscopy suite. Mental Status Examination: alert and                         oriented. Airway Examination:  normal oropharyngeal                         airway and neck mobility. Respiratory Examination:                         clear to auscultation. CV Examination: normal.                         Prophylactic  Antibiotics: The patient does not require                         prophylactic antibiotics. Prior Anticoagulants: The                         patient has taken no anticoagulant or antiplatelet                         agents. ASA Grade Assessment: III - A patient with                         severe systemic disease. After reviewing the risks and                         benefits, the patient was deemed in satisfactory                         condition to undergo the procedure. The anesthesia                         plan was to use general anesthesia. Immediately prior                         to administration of medications, the patient was                         re-assessed for adequacy to receive sedatives. The                         heart rate, respiratory rate, oxygen saturations,                         blood pressure, adequacy of pulmonary ventilation, and                         response to care were monitored throughout the                         procedure. The physical status of the patient was                         re-assessed after the procedure.                        After obtaining informed consent, the colonoscope was                         passed under direct vision. Throughout the procedure,  the patient's blood pressure, pulse, and oxygen                         saturations were monitored continuously. The                         Colonoscope was introduced through the anus and                         advanced to the the cecum, identified by appendiceal                         orifice and ileocecal valve. The colonoscopy was                         performed without difficulty. The patient tolerated                         the procedure well. The quality of the bowel                         preparation was evaluated using the BBPS [facility] Bowel                         Preparation Scale) with scores of: Right Colon = 3,                          Transverse Colon = 3 and Left Colon = 3 (entire mucosa                         seen well with no residual staining, small fragments                         of stool or opaque liquid). The total BBPS score                         equals 9. The ileocecal valve, appendiceal orifice,                         and rectum were photographed. Findings:      The perianal and digital rectal examinations were normal. Pertinent       negatives include normal sphincter tone and no palpable rectal lesions.      A 4 mm polyp was found in the proximal rectum. The polyp was sessile.       The polyp was removed with a cold snare. Resection and retrieval were       complete.      The retroflexed view of the distal rectum and anal verge was normal and       showed no anal or rectal abnormalities.      The exam was otherwise without abnormality. Impression:            - One 4 mm polyp in the proximal rectum, removed with                         a cold snare. Resected and retrieved.                        -  The distal rectum and anal verge are normal on                         retroflexion view.                        - The examination was otherwise normal. Recommendation:        - Discharge patient to home (with escort).                        - Resume previous diet today.                        - Continue present medications.                        - Await pathology results.                        - Repeat colonoscopy in 7-10 years for surveillance                         based on pathology results. Procedure Code(s):     --- Professional ---                        [phone], Colonoscopy, flexible; with removal of                         tumor(s), polyp(s), or other lesion(s) by snare                         technique Diagnosis Code(s):     --- Professional ---                        D12.8, Benign neoplasm of rectum                        Z12.11, Encounter for screening for malignant neoplasm                          of colon CPT copyright 2022 American [employer] Association. All rights reserved. The codes documented in this report are preliminary and upon coder review may  be revised to meet current compliance requirements. Dr. Libby Maw [name] MD, MD 03/27/2023 10:31:27 AM This report has been signed electronically. Number of Addenda: 0 Note Initiated On: 03/27/2023 9:59 AM Scope Withdrawal Time: 0 hours 10 minutes 32 seconds  Total Procedure Duration: 0 hours 12 minutes 54 seconds  Estimated Blood Loss:  Estimated blood loss: none.      [facility]

## 2023-03-28 ENCOUNTER — Encounter: Payer: Self-pay | Admitting: Gastroenterology

## 2023-03-28 LAB — SURGICAL PATHOLOGY

## 2023-03-31 ENCOUNTER — Telehealth: Payer: Self-pay

## 2023-03-31 ENCOUNTER — Encounter: Payer: Self-pay | Admitting: Gastroenterology

## 2023-03-31 ENCOUNTER — Telehealth: Payer: Self-pay | Admitting: Gastroenterology

## 2023-03-31 DIAGNOSIS — A048 Other specified bacterial intestinal infections: Secondary | ICD-10-CM

## 2023-03-31 NOTE — Telephone Encounter (Signed)
Pt left message for call back to discuss colonoscopy results please return call

## 2023-03-31 NOTE — Telephone Encounter (Signed)
Called and left a message for call back  

## 2023-03-31 NOTE — Telephone Encounter (Signed)
-----   Message from [name], MD sent at 03/31/2023  1:39 PM EDT ----- Please inform patient that the pathology results from upper endoscopy confirm H. pylori gastritis Recommend treatment for it, please send in the prescription for triple therapy to treat H Pylori for 14days  Omeprazole 20mg  BID Clarithromycin 500mg  BID Metronidazole 500 mg 3 times daily  Order H Pylori breath test in 4weeks after completing medication to confirm eradication.  Patient should be off prilosec and H2 blocker atleast for 2weeks before the test  Thanks RV

## 2023-04-01 DIAGNOSIS — H6091 Unspecified otitis externa, right ear: Secondary | ICD-10-CM | POA: Diagnosis not present

## 2023-04-01 DIAGNOSIS — Z139 Encounter for screening, unspecified: Secondary | ICD-10-CM | POA: Diagnosis not present

## 2023-04-01 DIAGNOSIS — H6691 Otitis media, unspecified, right ear: Secondary | ICD-10-CM | POA: Diagnosis not present

## 2023-04-01 NOTE — Telephone Encounter (Signed)
Pt left message returning call please return call 

## 2023-04-01 NOTE — Telephone Encounter (Signed)
Documented on open telephone call

## 2023-04-01 NOTE — Telephone Encounter (Signed)
Patient returned call and left a message at front desk yesterday afternoon. Called and left a message for call back

## 2023-04-02 ENCOUNTER — Encounter: Payer: Self-pay | Admitting: Gastroenterology

## 2023-04-02 MED ORDER — OMEPRAZOLE 20 MG PO CPDR: 20.0000 mg | DELAYED_RELEASE_CAPSULE | Freq: Two times a day (BID) | ORAL | 0 refills | Status: DC

## 2023-04-02 MED ORDER — METRONIDAZOLE 500 MG PO TABS: 500.0000 mg | ORAL_TABLET | Freq: Three times a day (TID) | ORAL | 0 refills | Status: AC

## 2023-04-02 MED ORDER — CLARITHROMYCIN 500 MG PO TABS: 500.0000 mg | ORAL_TABLET | Freq: Two times a day (BID) | ORAL | 0 refills | Status: AC

## 2023-04-02 NOTE — Telephone Encounter (Signed)
Called and left a message for call back Sent medications to the pharmacy order the h pylori breath test for 6 weeks and will call patient to remind him it is time to have this time put in  a reminder. Sent a Clinical cytogeneticist message with this information   Please advise if you would like me to do anything else for patient

## 2023-04-02 NOTE — Telephone Encounter (Signed)
Mailed letter to patient

## 2023-04-02 NOTE — Addendum Note (Signed)
Addended by: Radene Knee L on: 04/02/2023 09:06 AM   Modules accepted: Orders

## 2023-04-07 ENCOUNTER — Telehealth: Payer: Self-pay

## 2023-04-07 NOTE — Telephone Encounter (Signed)
-----   Message from [name], MD sent at 04/07/2023  8:23 AM EDT ----- I was able to call the patient and discussed pathology results with him as well as the treatment for H. Pylori.  He will come back early July for H. pylori breath test  RV

## 2023-04-30 ENCOUNTER — Telehealth: Payer: Self-pay

## 2023-04-30 NOTE — Telephone Encounter (Signed)
-----   Message from Denman George, CMA sent at 04/02/2023  9:02 AM EDT ----- Order H Pylori breath test in 4weeks after completing medication to confirm eradication.  Patient should be off prilosec and H2 blocker atleast for 2weeks before the test   First to stop the Omeprazole and any PPI

## 2023-04-30 NOTE — Telephone Encounter (Signed)
Patient states he is not taking and PPI at this time. He states he will come for labs tomorrow

## 2023-05-01 DIAGNOSIS — A048 Other specified bacterial intestinal infections: Secondary | ICD-10-CM | POA: Diagnosis not present

## 2023-05-03 LAB — H. PYLORI BREATH TEST: H pylori Breath Test: NEGATIVE

## 2023-05-05 ENCOUNTER — Telehealth: Payer: Self-pay

## 2023-05-05 NOTE — Telephone Encounter (Signed)
-----   Message from [name], MD sent at 05/05/2023  2:29 PM EDT ----- Please inform patient that the H. pylori breath test came back negative which means he does not have active H. pylori infection  RV

## 2023-05-05 NOTE — Telephone Encounter (Signed)
Patient verbalized understanding of results. He made his 6 month follow up appointment

## 2023-05-06 DIAGNOSIS — J309 Allergic rhinitis, unspecified: Secondary | ICD-10-CM | POA: Diagnosis not present

## 2023-05-06 DIAGNOSIS — I10 Essential (primary) hypertension: Secondary | ICD-10-CM | POA: Diagnosis not present

## 2023-05-06 DIAGNOSIS — G8929 Other chronic pain: Secondary | ICD-10-CM | POA: Diagnosis not present

## 2023-05-06 DIAGNOSIS — K7460 Unspecified cirrhosis of liver: Secondary | ICD-10-CM | POA: Diagnosis not present

## 2023-05-06 DIAGNOSIS — F324 Major depressive disorder, single episode, in partial remission: Secondary | ICD-10-CM | POA: Diagnosis not present

## 2023-05-06 DIAGNOSIS — I2510 Atherosclerotic heart disease of native coronary artery without angina pectoris: Secondary | ICD-10-CM | POA: Diagnosis not present

## 2023-05-06 DIAGNOSIS — E7800 Pure hypercholesterolemia, unspecified: Secondary | ICD-10-CM | POA: Diagnosis not present

## 2023-05-06 DIAGNOSIS — D509 Iron deficiency anemia, unspecified: Secondary | ICD-10-CM | POA: Diagnosis not present

## 2023-05-06 DIAGNOSIS — M542 Cervicalgia: Secondary | ICD-10-CM | POA: Diagnosis not present

## 2023-05-06 DIAGNOSIS — G629 Polyneuropathy, unspecified: Secondary | ICD-10-CM | POA: Diagnosis not present

## 2023-05-06 DIAGNOSIS — E1169 Type 2 diabetes mellitus with other specified complication: Secondary | ICD-10-CM | POA: Diagnosis not present

## 2023-05-26 DIAGNOSIS — H90A31 Mixed conductive and sensorineural hearing loss, unilateral, right ear with restricted hearing on the contralateral side: Secondary | ICD-10-CM | POA: Diagnosis not present

## 2023-05-26 DIAGNOSIS — H7191 Unspecified cholesteatoma, right ear: Secondary | ICD-10-CM | POA: Diagnosis not present

## 2023-05-26 DIAGNOSIS — H7201 Central perforation of tympanic membrane, right ear: Secondary | ICD-10-CM | POA: Diagnosis not present

## 2023-05-27 ENCOUNTER — Other Ambulatory Visit: Payer: Self-pay | Admitting: Otolaryngology

## 2023-05-27 DIAGNOSIS — H7191 Unspecified cholesteatoma, right ear: Secondary | ICD-10-CM

## 2023-06-09 ENCOUNTER — Ambulatory Visit
Admission: RE | Admit: 2023-06-09 | Discharge: 2023-06-09 | Disposition: A | Discharge status: Home / Self Care | Payer: Medicare HMO | Source: Ambulatory Visit | Attending: Otolaryngology | Admitting: Otolaryngology

## 2023-06-09 DIAGNOSIS — H7191 Unspecified cholesteatoma, right ear: Secondary | ICD-10-CM

## 2023-06-09 DIAGNOSIS — H6041 Cholesteatoma of right external ear: Secondary | ICD-10-CM | POA: Diagnosis not present

## 2023-06-17 DIAGNOSIS — H903 Sensorineural hearing loss, bilateral: Secondary | ICD-10-CM | POA: Diagnosis not present

## 2023-06-20 DIAGNOSIS — H903 Sensorineural hearing loss, bilateral: Secondary | ICD-10-CM | POA: Diagnosis not present

## 2023-06-25 DIAGNOSIS — H7191 Unspecified cholesteatoma, right ear: Secondary | ICD-10-CM | POA: Diagnosis not present

## 2023-06-25 DIAGNOSIS — H7201 Central perforation of tympanic membrane, right ear: Secondary | ICD-10-CM | POA: Diagnosis not present

## 2023-07-03 DIAGNOSIS — H903 Sensorineural hearing loss, bilateral: Secondary | ICD-10-CM | POA: Diagnosis not present

## 2023-07-04 ENCOUNTER — Other Ambulatory Visit
Admission: RE | Admit: 2023-07-04 | Discharge: 2023-07-04 | Disposition: A | Discharge status: Home / Self Care | Payer: Medicare HMO | Source: Ambulatory Visit | Attending: Nurse Practitioner | Admitting: Nurse Practitioner

## 2023-07-04 DIAGNOSIS — R001 Bradycardia, unspecified: Secondary | ICD-10-CM | POA: Diagnosis not present

## 2023-07-04 DIAGNOSIS — E782 Mixed hyperlipidemia: Secondary | ICD-10-CM | POA: Diagnosis not present

## 2023-07-04 DIAGNOSIS — Z01810 Encounter for preprocedural cardiovascular examination: Secondary | ICD-10-CM | POA: Insufficient documentation

## 2023-07-04 DIAGNOSIS — Z720 Tobacco use: Secondary | ICD-10-CM | POA: Diagnosis not present

## 2023-07-04 DIAGNOSIS — I25810 Atherosclerosis of coronary artery bypass graft(s) without angina pectoris: Secondary | ICD-10-CM | POA: Diagnosis not present

## 2023-07-04 DIAGNOSIS — I70219 Atherosclerosis of native arteries of extremities with intermittent claudication, unspecified extremity: Secondary | ICD-10-CM | POA: Diagnosis not present

## 2023-07-04 DIAGNOSIS — I5022 Chronic systolic (congestive) heart failure: Secondary | ICD-10-CM | POA: Insufficient documentation

## 2023-07-04 DIAGNOSIS — I255 Ischemic cardiomyopathy: Secondary | ICD-10-CM | POA: Insufficient documentation

## 2023-07-04 DIAGNOSIS — I10 Essential (primary) hypertension: Secondary | ICD-10-CM | POA: Diagnosis not present

## 2023-07-04 LAB — BRAIN NATRIURETIC PEPTIDE: B Natriuretic Peptide: 83 pg/mL (ref 0.0–100.0)

## 2023-07-21 DIAGNOSIS — I25810 Atherosclerosis of coronary artery bypass graft(s) without angina pectoris: Secondary | ICD-10-CM | POA: Diagnosis not present

## 2023-07-21 DIAGNOSIS — I5022 Chronic systolic (congestive) heart failure: Secondary | ICD-10-CM | POA: Diagnosis not present

## 2023-07-29 DIAGNOSIS — E782 Mixed hyperlipidemia: Secondary | ICD-10-CM | POA: Diagnosis not present

## 2023-07-29 DIAGNOSIS — I10 Essential (primary) hypertension: Secondary | ICD-10-CM | POA: Diagnosis not present

## 2023-07-29 DIAGNOSIS — I25810 Atherosclerosis of coronary artery bypass graft(s) without angina pectoris: Secondary | ICD-10-CM | POA: Diagnosis not present

## 2023-07-29 DIAGNOSIS — I255 Ischemic cardiomyopathy: Secondary | ICD-10-CM | POA: Diagnosis not present

## 2023-08-04 ENCOUNTER — Other Ambulatory Visit: Payer: Self-pay | Admitting: Otolaryngology

## 2023-08-04 DIAGNOSIS — H7201 Central perforation of tympanic membrane, right ear: Secondary | ICD-10-CM | POA: Diagnosis not present

## 2023-08-04 DIAGNOSIS — H7191 Unspecified cholesteatoma, right ear: Secondary | ICD-10-CM | POA: Diagnosis not present

## 2023-08-07 ENCOUNTER — Encounter: Payer: Self-pay | Admitting: Otolaryngology

## 2023-08-11 ENCOUNTER — Encounter: Payer: Self-pay | Admitting: Otolaryngology

## 2023-08-11 DIAGNOSIS — I2510 Atherosclerotic heart disease of native coronary artery without angina pectoris: Secondary | ICD-10-CM | POA: Diagnosis not present

## 2023-08-11 DIAGNOSIS — F324 Major depressive disorder, single episode, in partial remission: Secondary | ICD-10-CM | POA: Diagnosis not present

## 2023-08-11 DIAGNOSIS — J309 Allergic rhinitis, unspecified: Secondary | ICD-10-CM | POA: Diagnosis not present

## 2023-08-11 DIAGNOSIS — E7800 Pure hypercholesterolemia, unspecified: Secondary | ICD-10-CM | POA: Diagnosis not present

## 2023-08-11 DIAGNOSIS — G8929 Other chronic pain: Secondary | ICD-10-CM | POA: Diagnosis not present

## 2023-08-11 DIAGNOSIS — E1169 Type 2 diabetes mellitus with other specified complication: Secondary | ICD-10-CM | POA: Diagnosis not present

## 2023-08-11 DIAGNOSIS — M542 Cervicalgia: Secondary | ICD-10-CM | POA: Diagnosis not present

## 2023-08-11 DIAGNOSIS — G629 Polyneuropathy, unspecified: Secondary | ICD-10-CM | POA: Diagnosis not present

## 2023-08-11 DIAGNOSIS — R918 Other nonspecific abnormal finding of lung field: Secondary | ICD-10-CM | POA: Diagnosis not present

## 2023-08-11 DIAGNOSIS — K7460 Unspecified cirrhosis of liver: Secondary | ICD-10-CM | POA: Diagnosis not present

## 2023-08-11 DIAGNOSIS — I10 Essential (primary) hypertension: Secondary | ICD-10-CM | POA: Diagnosis not present

## 2023-08-11 NOTE — Anesthesia Preprocedure Evaluation (Addendum)
Anesthesia Evaluation  Patient identified by MRN, date of birth, ID band Patient awake    Reviewed: Allergy & Precautions, H&P , NPO status , Patient's Chart, lab work & pertinent test results  History of Anesthesia Complications (+) DIFFICULT AIRWAY and history of anesthetic complications  Airway Mallampati: III  TM Distance: >3 FB Neck ROM: Full    Dental no notable dental hx. (+) Edentulous Upper, Edentulous Lower   Pulmonary shortness of breath, former smoker   Pulmonary exam normal breath sounds clear to auscultation       Cardiovascular hypertension, + angina  + CAD and + Peripheral Vascular Disease  Normal cardiovascular exam Rhythm:Regular Rate:Normal  07-04-23 dr. Isidore Moos visit; EKG  - sinus bradycardia, HR 57 bpm, inverted T waves in V1 and V2, which is unchanged from 11/29/2020 EKG  Lexiscan with Myoview 07/21/23 showed normal LV function EF 56%, no regional wall abnormalities, no e/o scar or ischemia.   ECHO 07/21/23 with EF 45-50% and septal hypocontractility, trivial valvular regurg, no valvular stenosis. No significant change when compared to 2022 ECHO.  07-29-23 cleared by Nurse Practitioner, Ms. Mary Cheek   Neuro/Psych  PSYCHIATRIC DISORDERS  Depression     Neuromuscular disease negative neurological ROS  negative psych ROS   GI/Hepatic Neg liver ROS, PUD,GERD  ,,  Endo/Other  diabetes    Renal/GU negative Renal ROS  negative genitourinary   Musculoskeletal  (+) Arthritis ,    Abdominal   Peds negative pediatric ROS (+)  Hematology  (+) Blood dyscrasia, anemia   Anesthesia Other Findings Hypertension  Coronary artery disease Depression  Diabetes mellitus without complication (HCC) GERD (gastroesophageal reflux disease) Hyperlipidemia Insomnia  Peripheral vascular disease (HCC) Stomach ulcer History of MRSA infection Anginal pain   Bronchitis Dyspnea  Arthritis Difficult intubation--patient  does not recall this problem Anemia  Cirrhosis, 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   Reproductive/Obstetrics negative OB ROS                             Anesthesia Physical Anesthesia Plan  ASA: 3  Anesthesia Plan: General   Post-op Pain Management:    Induction: Intravenous  PONV Risk Score and Plan:   Airway Management Planned: LMA  Additional Equipment:   Intra-op Plan:   Post-operative Plan: Extubation in OR  Informed Consent: I have reviewed the patients History and Physical, chart, labs and discussed the procedure including the risks, benefits and alternatives for the proposed anesthesia with the patient or authorized representative who has indicated his/her understanding and acceptance.     Dental Advisory Given  Plan Discussed with: Anesthesiologist, CRNA and Surgeon  Anesthesia Plan Comments: (Patient consented for risks of anesthesia including but not limited to:  - adverse reactions to medications - damage to eyes, teeth, lips or other oral mucosa - nerve damage due to positioning  - sore throat or hoarseness - Damage to heart, brain, nerves, lungs, other parts of body or loss of life  Patient voiced understanding.)        Anesthesia Quick Evaluation

## 2023-08-12 ENCOUNTER — Other Ambulatory Visit: Payer: Self-pay

## 2023-08-12 DIAGNOSIS — R001 Bradycardia, unspecified: Secondary | ICD-10-CM | POA: Diagnosis not present

## 2023-08-12 MED ORDER — CIPROFLOXACIN-DEXAMETHASONE 0.3-0.1 % OT SUSP
4.0000 [drp] | Freq: Two times a day (BID) | OTIC | 0 refills | Status: DC
  Filled 2023-08-12: qty 7.5, 5d supply, fill #0

## 2023-08-13 ENCOUNTER — Encounter: Payer: Self-pay | Admitting: Otolaryngology

## 2023-08-18 ENCOUNTER — Other Ambulatory Visit: Payer: Self-pay | Admitting: Physician Assistant

## 2023-08-18 DIAGNOSIS — R918 Other nonspecific abnormal finding of lung field: Secondary | ICD-10-CM

## 2023-08-19 ENCOUNTER — Encounter: Payer: Self-pay | Admitting: Otolaryngology

## 2023-08-19 ENCOUNTER — Ambulatory Visit: Payer: Medicare HMO | Admitting: Anesthesiology

## 2023-08-19 ENCOUNTER — Other Ambulatory Visit: Payer: Self-pay

## 2023-08-19 ENCOUNTER — Ambulatory Visit
Admission: RE | Admit: 2023-08-19 | Discharge: 2023-08-19 | Disposition: A | Discharge status: Home / Self Care | Payer: Medicare HMO | Attending: Otolaryngology | Admitting: Otolaryngology

## 2023-08-19 ENCOUNTER — Encounter
Admission: RE | Disposition: A | Discharge status: Home / Self Care | Payer: Self-pay | Source: Home / Self Care | Attending: Otolaryngology

## 2023-08-19 DIAGNOSIS — K219 Gastro-esophageal reflux disease without esophagitis: Secondary | ICD-10-CM | POA: Diagnosis not present

## 2023-08-19 DIAGNOSIS — H7191 Unspecified cholesteatoma, right ear: Secondary | ICD-10-CM | POA: Insufficient documentation

## 2023-08-19 DIAGNOSIS — E1151 Type 2 diabetes mellitus with diabetic peripheral angiopathy without gangrene: Secondary | ICD-10-CM | POA: Diagnosis not present

## 2023-08-19 DIAGNOSIS — I2510 Atherosclerotic heart disease of native coronary artery without angina pectoris: Secondary | ICD-10-CM | POA: Insufficient documentation

## 2023-08-19 DIAGNOSIS — Z87891 Personal history of nicotine dependence: Secondary | ICD-10-CM | POA: Diagnosis not present

## 2023-08-19 DIAGNOSIS — Z951 Presence of aortocoronary bypass graft: Secondary | ICD-10-CM | POA: Insufficient documentation

## 2023-08-19 DIAGNOSIS — Z8614 Personal history of Methicillin resistant Staphylococcus aureus infection: Secondary | ICD-10-CM | POA: Diagnosis not present

## 2023-08-19 DIAGNOSIS — I10 Essential (primary) hypertension: Secondary | ICD-10-CM | POA: Diagnosis not present

## 2023-08-19 DIAGNOSIS — H7291 Unspecified perforation of tympanic membrane, right ear: Secondary | ICD-10-CM | POA: Insufficient documentation

## 2023-08-19 DIAGNOSIS — I25119 Atherosclerotic heart disease of native coronary artery with unspecified angina pectoris: Secondary | ICD-10-CM | POA: Diagnosis not present

## 2023-08-19 DIAGNOSIS — H7201 Central perforation of tympanic membrane, right ear: Secondary | ICD-10-CM | POA: Diagnosis not present

## 2023-08-19 HISTORY — DX: Chronic systolic (congestive) heart failure: I50.22

## 2023-08-19 HISTORY — DX: Presence of external hearing-aid: Z97.4

## 2023-08-19 HISTORY — DX: Failed or difficult intubation, initial encounter: T88.4XXA

## 2023-08-19 HISTORY — DX: Presence of aortocoronary bypass graft: Z95.1

## 2023-08-19 HISTORY — DX: Unspecified cirrhosis of liver: K74.60

## 2023-08-19 HISTORY — PX: TYMPANOPLASTY: SHX33

## 2023-08-19 HISTORY — DX: Atherosclerosis of native arteries of extremities with intermittent claudication, unspecified extremity: I70.219

## 2023-08-19 HISTORY — DX: Ischemic cardiomyopathy: I25.5

## 2023-08-19 HISTORY — DX: Anemia, unspecified: D64.9

## 2023-08-19 SURGERY — TYMPANOPLASTY
Anesthesia: General | Site: Ear | Laterality: Right

## 2023-08-19 MED ORDER — OFLOXACIN 0.3 % OP SOLN: 4.0000 [drp] | Freq: Two times a day (BID) | OPHTHALMIC | 5 refills | Status: DC

## 2023-08-19 MED ORDER — DEXMEDETOMIDINE HCL IN NACL 80 MCG/20ML IV SOLN
INTRAVENOUS | Status: DC | PRN
Start: 2023-08-19 — End: 2023-08-19
  Administered 2023-08-19 (×2): 4 ug via INTRAVENOUS

## 2023-08-19 MED ORDER — GELATIN ABSORBABLE 12-7 MM EX MISC
CUTANEOUS | Status: DC | PRN
  Administered 2023-08-19: 1

## 2023-08-19 MED ORDER — MIDAZOLAM HCL 2 MG/2ML IJ SOLN
INTRAMUSCULAR | Status: AC
  Filled 2023-08-19: qty 2

## 2023-08-19 MED ORDER — EPHEDRINE 5 MG/ML INJ
INTRAVENOUS | Status: AC
  Filled 2023-08-19: qty 5

## 2023-08-19 MED ORDER — ONDANSETRON HCL 4 MG/2ML IJ SOLN
INTRAMUSCULAR | Status: DC | PRN
Start: 2023-08-19 — End: 2023-08-19
  Administered 2023-08-19: 4 mg via INTRAVENOUS

## 2023-08-19 MED ORDER — ONDANSETRON HCL 4 MG/2ML IJ SOLN
INTRAMUSCULAR | Status: AC
  Filled 2023-08-19: qty 2

## 2023-08-19 MED ORDER — MIDAZOLAM HCL 5 MG/5ML IJ SOLN
INTRAMUSCULAR | Status: DC | PRN
  Administered 2023-08-19: 2 mg via INTRAVENOUS

## 2023-08-19 MED ORDER — DEXAMETHASONE SODIUM PHOSPHATE 4 MG/ML IJ SOLN
INTRAMUSCULAR | Status: DC | PRN
  Administered 2023-08-19: 8 mg via INTRAVENOUS

## 2023-08-19 MED ORDER — LIDOCAINE HCL (CARDIAC) PF 100 MG/5ML IV SOSY
PREFILLED_SYRINGE | INTRAVENOUS | Status: DC | PRN
  Administered 2023-08-19: 100 mg via INTRATRACHEAL

## 2023-08-19 MED ORDER — CIPROFLOXACIN-DEXAMETHASONE 0.3-0.1 % OT SUSP: 4.0000 [drp] | Freq: Two times a day (BID) | OTIC | Status: DC

## 2023-08-19 MED ORDER — ACETAMINOPHEN 10 MG/ML IV SOLN
INTRAVENOUS | Status: AC
  Filled 2023-08-19: qty 100

## 2023-08-19 MED ORDER — HYDROCODONE-ACETAMINOPHEN 5-325 MG PO TABS: 1.0000 | ORAL_TABLET | Freq: Four times a day (QID) | ORAL | 0 refills | Status: DC | PRN

## 2023-08-19 MED ORDER — PHENYLEPHRINE 80 MCG/ML (10ML) SYRINGE FOR IV PUSH (FOR BLOOD PRESSURE SUPPORT)
PREFILLED_SYRINGE | INTRAVENOUS | Status: AC
  Filled 2023-08-19: qty 10

## 2023-08-19 MED ORDER — ACETAMINOPHEN 10 MG/ML IV SOLN
INTRAVENOUS | Status: DC | PRN
Start: 2023-08-19 — End: 2023-08-19
  Administered 2023-08-19: 1000 mg via INTRAVENOUS

## 2023-08-19 MED ORDER — PHENYLEPHRINE HCL (PRESSORS) 10 MG/ML IV SOLN
INTRAVENOUS | Status: DC | PRN
Start: 2023-08-19 — End: 2023-08-19
  Administered 2023-08-19: 80 ug via INTRAVENOUS
  Administered 2023-08-19 (×2): 160 ug via INTRAVENOUS
  Administered 2023-08-19: 100 ug via INTRAVENOUS

## 2023-08-19 MED ORDER — OXYCODONE HCL 5 MG PO TABS
ORAL_TABLET | ORAL | 0 refills | Status: DC
Start: 2023-08-19 — End: 2023-09-15

## 2023-08-19 MED ORDER — SUCCINYLCHOLINE CHLORIDE 200 MG/10ML IV SOSY
PREFILLED_SYRINGE | INTRAVENOUS | Status: AC
  Filled 2023-08-19: qty 10

## 2023-08-19 MED ORDER — LIDOCAINE-EPINEPHRINE 1 %-1:100000 IJ SOLN
INTRAMUSCULAR | Status: DC | PRN
  Administered 2023-08-19: 4 mL
  Administered 2023-08-19: .5 mL

## 2023-08-19 MED ORDER — BACITRACIN 500 UNIT/GM EX OINT
TOPICAL_OINTMENT | CUTANEOUS | Status: DC | PRN
  Administered 2023-08-19: 1 via TOPICAL

## 2023-08-19 MED ORDER — LIDOCAINE HCL (PF) 2 % IJ SOLN
INTRAMUSCULAR | Status: AC
  Filled 2023-08-19: qty 5

## 2023-08-19 MED ORDER — FENTANYL CITRATE (PF) 100 MCG/2ML IJ SOLN
INTRAMUSCULAR | Status: DC | PRN
  Administered 2023-08-19 (×4): 25 ug via INTRAVENOUS

## 2023-08-19 MED ORDER — EPHEDRINE SULFATE (PRESSORS) 50 MG/ML IJ SOLN
INTRAMUSCULAR | Status: DC | PRN
  Administered 2023-08-19: 10 mg via INTRAVENOUS
  Administered 2023-08-19: 5 mg via INTRAVENOUS
  Administered 2023-08-19: 10 mg via INTRAVENOUS

## 2023-08-19 MED ORDER — FENTANYL CITRATE (PF) 100 MCG/2ML IJ SOLN
INTRAMUSCULAR | Status: AC
  Filled 2023-08-19: qty 2

## 2023-08-19 MED ORDER — PROPOFOL 10 MG/ML IV BOLUS
INTRAVENOUS | Status: DC | PRN
  Administered 2023-08-19: 50 mg via INTRAVENOUS
  Administered 2023-08-19: 200 mg via INTRAVENOUS

## 2023-08-19 MED ORDER — LACTATED RINGERS IV SOLN: INTRAVENOUS | Status: DC

## 2023-08-19 MED ORDER — CIPROFLOXACIN-DEXAMETHASONE 0.3-0.1 % OT SUSP
OTIC | Status: DC | PRN
  Administered 2023-08-19: 4 [drp] via OTIC

## 2023-08-19 MED ORDER — DEXMEDETOMIDINE HCL IN NACL 80 MCG/20ML IV SOLN
INTRAVENOUS | Status: AC
  Filled 2023-08-19: qty 20

## 2023-08-19 MED ORDER — DEXAMETHASONE SODIUM PHOSPHATE 4 MG/ML IJ SOLN
INTRAMUSCULAR | Status: AC
  Filled 2023-08-19: qty 2

## 2023-08-19 SURGICAL SUPPLY — 33 items
ADH LQ OCL WTPRF AMP STRL LF (MISCELLANEOUS) ×1
ADHESIVE MASTISOL STRL (MISCELLANEOUS) ×2 IMPLANT
BALL CTTN LRG ABS STRL LF (GAUZE/BANDAGES/DRESSINGS) ×1
BLADE EAR TYMPAN 2.5 60D BEAV (BLADE) ×2 IMPLANT
CANISTER SUCT 1200ML W/VALVE (MISCELLANEOUS) ×2 IMPLANT
CATH IV 18X1 1/4 SAFELET (CATHETERS) ×2 IMPLANT
COTTONBALL LRG STERILE PKG (GAUZE/BANDAGES/DRESSINGS) ×2 IMPLANT
DRAIN PENROSE 12X.25 LTX STRL (MISCELLANEOUS) IMPLANT
DRAPE MICROSCOPE ZEISS INVISI (DRAPES) ×2 IMPLANT
DRAPE SURG 17X11 SM STRL (DRAPES) ×4 IMPLANT
DRSG GLASSCOCK MASTOID ADT (GAUZE/BANDAGES/DRESSINGS) IMPLANT
DRSG TELFA 4X3 1S NADH ST (GAUZE/BANDAGES/DRESSINGS) IMPLANT
ELECT CAUTERY BLADE TIP 2.5 (TIP) ×1
ELECTRODE CAUTERY BLDE TIP 2.5 (TIP) IMPLANT
GLOVE SURG ENC MOIS LTX SZ7.5 (GLOVE) ×4 IMPLANT
GLOVE SURG TRIUMPH 8.0 PF LTX (GLOVE) ×2 IMPLANT
GOWN STRL REUS W/ TWL LRG LVL3 (GOWN DISPOSABLE) ×2 IMPLANT
GOWN STRL REUS W/TWL LRG LVL3 (GOWN DISPOSABLE) ×1
IV CATH 18X1 1/4 SAFELET (CATHETERS) ×1
KIT TURNOVER KIT A (KITS) ×2 IMPLANT
NS IRRIG 500ML POUR BTL (IV SOLUTION) ×2 IMPLANT
PACK ENT CUSTOM (PACKS) ×2 IMPLANT
PENCIL SMOKE EVACUATOR (MISCELLANEOUS) IMPLANT
SLEEVE PROTECTION STRL DISP (MISCELLANEOUS) ×4 IMPLANT
SOL PREP PVP 2OZ (MISCELLANEOUS) ×1
SOLUTION PREP PVP 2OZ (MISCELLANEOUS) ×2 IMPLANT
STRAP BODY AND KNEE 60X3 (MISCELLANEOUS) ×2 IMPLANT
SUT PLAIN GUT FAST 5-0 (SUTURE) ×2 IMPLANT
SUT VIC AB 4-0 RB1 27 (SUTURE) ×1
SUT VIC AB 4-0 RB1 27X BRD (SUTURE) ×2 IMPLANT
SYR 10ML LL (SYRINGE) ×2 IMPLANT
SYR 3ML LL SCALE MARK (SYRINGE) ×2 IMPLANT
SYR EAR/ULCER 2OZ (SYRINGE) ×2 IMPLANT

## 2023-08-19 NOTE — Transfer of Care (Signed)
Immediate Anesthesia Transfer of Care Note  Patient: [name]  Procedure(s) Performed: TYMPANOPLASTY WITH REMOVAL OF CHOLESTEATOMA (Right: Ear)  Patient Location: PACU  Anesthesia Type: General  Level of Consciousness: awake, alert  and patient cooperative  Airway and Oxygen Therapy: Patient Spontanous Breathing and Patient connected to supplemental oxygen  Post-op Assessment: Post-op Vital signs reviewed, Patient's Cardiovascular Status Stable, Respiratory Function Stable, Patent Airway and No signs of Nausea or vomiting  Post-op Vital Signs: Reviewed and stable  Complications: No notable events documented.

## 2023-08-19 NOTE — Anesthesia Procedure Notes (Signed)
Procedure Name: LMA Insertion Date/Time: 08/19/2023 9:00 AM  Performed by: Truman Aceituno, [place], CRNAPre-anesthesia Checklist: Patient identified, Patient being monitored, Timeout performed, Emergency Drugs available and Suction available Patient Re-evaluated:Patient Re-evaluated prior to induction Oxygen Delivery Method: Circle system utilized Preoxygenation: Pre-oxygenation with 100% oxygen Induction Type: IV induction Ventilation: Mask ventilation without difficulty LMA: LMA inserted LMA Size: 4.0 Tube type: Oral Number of attempts: 1 Placement Confirmation: positive ETCO2 and breath sounds checked- equal and bilateral Tube secured with: Tape Dental Injury: Teeth and Oropharynx as per pre-operative assessment

## 2023-08-19 NOTE — Op Note (Signed)
08/19/2023  11:16 AM    [name]  [id number]   Pre-Op Diagnosis:  Right tympanic membrane perforation, cholesteatoma  Post-op Diagnosis: Right tympanic membrane perforation, cholesteatoma  Procedure:   RIGHT TYMPANOPLASTY WITH REMOVAL OF CHOLESTEATOMA  Surgeon:  Sandi Mealy  Anesthesia:  General endotracheal  EBL:  Less than 25 cc  Complications:  None  Findings: 4mm perforation anterior to the malleus with extensive keratin ingrowth into the middle ear, covering the full inner surface of the TM, encasing the malleus and the chorda tympani nerve, focal areas of incus involvement without erosion.    Procedure: The patient was taken to the Operating Room and placed in the supine position.  After induction of general endotracheal anesthesia, the patient was turned 90 degrees. After a time-out confirming the consented procedure and site, 1% lidocaine with epinephrine 1: 100,000 was injected into the postauricular region and, under visualization with the operating microscope, into the canal skin in the posterior and inferior canal. The right ear was then prepped and draped in the usual sterile fashion. The ear was evaluated under the operating microscope through an ear speculum, and the canal cleaned and irrigated with saline, and the perforation inspected. The findings were as described above. Vertical canal incisions were then made at 12:00 and 6:00, followed by a horizontal incision approximately 4mm posterior to the annulus.    An incision was made with a 15 blade just behind the post-auricular crease below the hairline. The incision was carried down through the subcutaneous tissues with the Bovie, and dissection carried down to the temporalis fascia. Scissors were used to harvest an adequately sized fascia graft, which was set aside, pressed and dried for later use. Next dissection proceeded down into the canal, elevating the posterior canal vascular strip until the previously  made canal cuts were encountered. The ear was then retracted forward with a Penrose drain through the canal, and a self retainer.   A weapon was used to elevate the canal skin down to the annulus, and the annulus was then carefully elevated from the tympanic ring with a Rosen needle, taking care to avoid injury to the Chorda Tympani nerve. It became immediately evident that there was extensive keratin ingrowth into the middle ear as skin was encountered during elevation of the TM inferiorly and continued up more superiorly there was found to be encasing the chorda tympani nerve.  The TM was elevated and during the course of this it was clear, although some of the chorda tympani nerve could be complained of this debris, that the keratin surrounded the chorda tympani and was scarred to it particularly around the region of the malleus.  In order to provide adequate clearance of the cholesteatoma it was felt the chorda tympani nerve required sacrifice so this was divided with Bellucci scissors.  An attempt was made to try to clean the keratin from the tympanic membrane but there was too much inflammation adhering it to the TM, so the decision was made that the involved tympanic membrane would be removed and completely grafted.  Dissection then proceeded, resecting the involved tympanic membrane, carefully removing the keratin debris from the incus, which it contacted, dividing some synechiae attaching the incus to the keratin.  The keratin debris did not appear to extend under the incus fortunately.  More anteriorly the keratin was growing under the malleus along its entire length.  The tympanic membrane was basically resected from the malleus including the periosteum around the malleus to make sure  that all debris was completely removed from the undersurface of the malleus.  Some of the debris was adherent to the tensor tympani but was easily cleared away with no apparent residual.  There was some minimal extension  into the epitympanum however this was easily cleared during the course of dissection.  Removal of the involved tympanic membrane ultimately involve removing all but a small strip of TM anteriorly in addition to sparing the annulus.  Once this resolved cleared and the specimen passed off, irrigated to remove any loose debris.  A weapon was then used to work around the margins of the tympanic ring, gently scraping the undersurface to remove any residual epithelial debris.  Fortunately this appeared to be minimal and there did not appear to be any extension anteriorly towards the eustachian tube or into the hypotympanum or facial recess areas.  Once again the ear was generously irrigated.  The fascia graft was then inspected and a small knot placed superiorly to help it slide under the annulus and the tensor tympani.  This was placed into position.  To be an excellent fit with all edges secured under the small amount of TM remnant.  Ciprodex moistened Gelfoam was then used to pack the middle ear, taking care to make sure the graft was well supported particularly anteriorly and superiorly.  Once the middle ear was adequately packed, the vascular strip was placed back into position over the fascial graft, taking care to evert the skin edges.  Ciprodex moistened Gelfoam was then packed lateral to the graft until the graft and vascular strip were will covered.  Hemostasis was obtained and the post-auricular wound closed with 4-0 Vicryl suture for the subcutaneous closure, followed by 5-0 fast absorbing gut suture in a running locked stitch for the skin.   The proximal canal skin was then inspected was then inspected and everted, followed by carefully draping it out over the posterior canal, overlapping the posterior edge of the graft.  The canal was further packed with Ciprodex moistened Gelfoam.  Bacitracin ointment was applied to the postauricular incision. A cotton ball was placed at the external meatus, and a  Glasscock dressing applied to the ear.    The patient was then returned to the anesthesiologist for awakening, and was taken to the Recovery Room in stable condition.   Disposition:   PACU then discharge home   Plan: Remove the ear dressing as instructed, then start Ciprodex ear drops as prescribed and water precautions.  Antibiotic ointment to the postauricular wound. Follow-up at my office as scheduled.  Sandi Mealy 08/19/2023 11:16 AM

## 2023-08-19 NOTE — H&P (Signed)
History and physical reviewed and will be scanned in later. No change in medical status reported by the patient or family, appears stable for surgery. All questions regarding the procedure answered, and patient (or family if a child) expressed understanding of the procedure. ? ?Sandi Mealy ?@TODAY @ ?

## 2023-08-19 NOTE — Anesthesia Postprocedure Evaluation (Signed)
Anesthesia Post Note  Patient: [name]  Procedure(s) Performed: TYMPANOPLASTY WITH REMOVAL OF CHOLESTEATOMA (Right: Ear)  Patient location during evaluation: PACU Anesthesia Type: General Level of consciousness: awake and alert Pain management: pain level controlled Vital Signs Assessment: post-procedure vital signs reviewed and stable Respiratory status: spontaneous breathing, nonlabored ventilation, respiratory function stable and patient connected to nasal cannula oxygen Cardiovascular status: blood pressure returned to baseline and stable Postop Assessment: no apparent nausea or vomiting Anesthetic complications: no   No notable events documented.   Last Vitals:  Vitals:   08/19/23 1200 08/19/23 1214  BP: (!) 142/87 136/79  Pulse: 66 66  Resp: 18 15  Temp:  36.5 C  SpO2: 92% 94%    Last Pain:  Vitals:   08/19/23 1214  TempSrc:   PainSc: 0-No pain                 Chasidy Janak C Nashalie Sallis

## 2023-08-20 ENCOUNTER — Encounter: Payer: Self-pay | Admitting: Otolaryngology

## 2023-08-21 DIAGNOSIS — R001 Bradycardia, unspecified: Secondary | ICD-10-CM | POA: Diagnosis not present

## 2023-08-21 LAB — SURGICAL PATHOLOGY

## 2023-08-26 DIAGNOSIS — I255 Ischemic cardiomyopathy: Secondary | ICD-10-CM | POA: Diagnosis not present

## 2023-08-26 DIAGNOSIS — I10 Essential (primary) hypertension: Secondary | ICD-10-CM | POA: Diagnosis not present

## 2023-09-08 DIAGNOSIS — Z720 Tobacco use: Secondary | ICD-10-CM | POA: Diagnosis not present

## 2023-09-08 DIAGNOSIS — R001 Bradycardia, unspecified: Secondary | ICD-10-CM | POA: Diagnosis not present

## 2023-09-08 DIAGNOSIS — I6523 Occlusion and stenosis of bilateral carotid arteries: Secondary | ICD-10-CM | POA: Diagnosis not present

## 2023-09-08 DIAGNOSIS — I25810 Atherosclerosis of coronary artery bypass graft(s) without angina pectoris: Secondary | ICD-10-CM | POA: Diagnosis not present

## 2023-09-08 DIAGNOSIS — E782 Mixed hyperlipidemia: Secondary | ICD-10-CM | POA: Diagnosis not present

## 2023-09-08 DIAGNOSIS — I10 Essential (primary) hypertension: Secondary | ICD-10-CM | POA: Diagnosis not present

## 2023-09-08 DIAGNOSIS — I255 Ischemic cardiomyopathy: Secondary | ICD-10-CM | POA: Diagnosis not present

## 2023-09-08 DIAGNOSIS — R0989 Other specified symptoms and signs involving the circulatory and respiratory systems: Secondary | ICD-10-CM | POA: Diagnosis not present

## 2023-09-10 ENCOUNTER — Other Ambulatory Visit: Payer: Self-pay

## 2023-09-15 ENCOUNTER — Encounter: Payer: Self-pay | Admitting: Gastroenterology

## 2023-09-15 ENCOUNTER — Ambulatory Visit: Payer: Medicare HMO | Admitting: Gastroenterology

## 2023-09-15 VITALS — BP 147/77 | HR 60 | Temp 97.7°F | Ht 71.0 in | Wt 182.0 lb

## 2023-09-15 DIAGNOSIS — K7581 Nonalcoholic steatohepatitis (NASH): Secondary | ICD-10-CM

## 2023-09-15 DIAGNOSIS — K7460 Unspecified cirrhosis of liver: Secondary | ICD-10-CM

## 2023-09-15 DIAGNOSIS — E559 Vitamin D deficiency, unspecified: Secondary | ICD-10-CM

## 2023-09-15 NOTE — Progress Notes (Signed)
[name], MD [address]  [address]  [place], [place] [address]  Main: [phone]  Fax: [phone]    Gastroenterology Consultation  Referring Provider:     [name], PA-C Primary Care Physician:  [name], PA-C Primary Gastroenterologist:  Dr. [name] Reason for Consultation: Cirrhosis of liver        HPI:   [name] is a 67 y.o. male referred by [name], PA-C  for consultation & management of cirrhosis of liver.  Patient underwent right upper quadrant ultrasound on 01/31/2023, found to have cirrhosis of liver.  This was performed for elevated LFTs.  Therefore, referred to GI to establish care.  He had elevated alkaline phosphatase in 2019, mildly elevated AST and ALT in 2012.  History of COPD, diabetes diet controlled, coronary artery disease, underwent CABG several years ago Patient smokes tobacco, accompanied by his daughter and son-in-law.  He denies any swelling of legs, abdominal distention, rectal bleeding, melena, nausea or vomiting, pruritus, jaundice. He reports having blood transfusion when he had CABG.  Denies any IV drug abuse, no known history of hepatitis B or C  Follow-up visit 09/15/2023 [name] is here for follow-up of MASH cirrhosis.  He underwent secondary liver disease workup came back unremarkable.  He has compensated cirrhosis of liver.  EGD did not reveal any varices, H. pylori was positive, treated with triple therapy.  Confirmed eradication by H. pylori breath test.  Patient does not have any concerns today other than symptoms of fibromyalgia.  He continues to smoke tobacco.  NSAIDs: None  Antiplts/Anticoagulants/Anti thrombotics: None  GI Procedures:   EGD and colonoscopy 03/26/2023 - Normal duodenal bulb and second portion of the duodenum. - Erythematous mucosa in the stomach. Biopsied. - Esophagogastric landmarks identified. - Normal gastroesophageal junction and esophagus.  - One 4 mm polyp in the  proximal rectum, removed with a cold snare. Resected and retrieved. - The distal rectum and anal verge are normal on retroflexion view.  DIAGNOSIS:  A. STOMACH; COLD BIOPSY:  - MODERATE CHRONIC ACTIVE HELICOBACTER-ASSOCIATED GASTRITIS.  - INCIDENTAL BENIGN TACTILE CORPUSCLE-LIKE BODIES.  - NEGATIVE FOR INTESTINAL METAPLASIA, DYSPLASIA, AND MALIGNANCY.   B.  COLON POLYP, DESCENDING; COLD SNARE:  - TUBULAR ADENOMA.  - NEGATIVE FOR HIGH-GRADE DYSPLASIA AND MALIGNANCY.   Past Medical History:  Diagnosis Date   Anemia    Anginal pain (HCC)    Arthritis    Atherosclerotic peripheral vascular disease with intermittent claudication (HCC)    Bronchitis    h/o   Cirrhosis, non-alcoholic (HCC)    Coronary artery disease    Depression    Diabetes mellitus without complication (HCC)    diet controlled   Difficult intubation    Pt Denies   Dyspnea    with exertion   GERD (gastroesophageal reflux disease)    Heart failure with mid-range ejection fraction (HFmEF) (HCC)    History of MRSA infection 2010   Hx of CABG    Hyperlipidemia    Hypertension    Insomnia    Ischemic cardiomyopathy    Peripheral vascular disease (HCC)    Stomach ulcer    Stomach ulcer    Wears hearing aid in both ears     Past Surgical History:  Procedure Laterality Date   ANTERIOR CERVICAL DECOMP/DISCECTOMY FUSION N/A 01/28/2018   Procedure: ANTERIOR CERVICAL DECOMPRESSION/DISCECTOMY FUSION 2 LEVELS C3-5;  Surgeon: [name], MD;  Location: [facility] ORS;  Service: Neurosurgery;  Laterality: N/A;   COLON  SURGERY  2004   due to diverticulitis.  Partail colectomy with Anastamosis   COLONOSCOPY N/A 03/27/2023   Procedure: COLONOSCOPY;  Surgeon: [name], MD;  Location: [facility] ENDOSCOPY;  Service: Gastroenterology;  Laterality: N/A;   COLONOSCOPY WITH PROPOFOL N/A 03/26/2023   Procedure: COLONOSCOPY WITH PROPOFOL;  Surgeon: [name], MD;  Location: [facility] ENDOSCOPY;  Service:  Gastroenterology;  Laterality: N/A;   CORONARY ARTERY BYPASS GRAFT     ESOPHAGOGASTRODUODENOSCOPY (EGD) WITH PROPOFOL N/A 03/26/2023   Procedure: ESOPHAGOGASTRODUODENOSCOPY (EGD) WITH PROPOFOL;  Surgeon: [name], MD;  Location: [facility] ENDOSCOPY;  Service: Gastroenterology;  Laterality: N/A;   LUMBAR LAMINECTOMY/DECOMPRESSION MICRODISCECTOMY N/A 09/01/2019   Procedure: LUMBAR LAMINECTOMY/DECOMPRESSION MICRODISCECTOMY 3 LEVELS L2-L5;  Surgeon: [name], MD;  Location: [facility] ORS;  Service: Neurosurgery;  Laterality: N/A;   neck fusion     SHOULDER ARTHROSCOPY WITH OPEN ROTATOR CUFF REPAIR Right 04/08/2016   Procedure: SHOULDER ARTHROSCOPY WITH OPEN ROTATOR CUFF REPAIR;  Surgeon: [name], MD;  Location: [facility] ORS;  Service: Orthopedics;  Laterality: Right;   TONSILLECTOMY     TRANSFORAMINAL LUMBAR INTERBODY FUSION W/ MIS 1 LEVEL N/A 09/01/2019   Procedure: MINIMALLY INVASIVE (MIS) TRANSFORAMINAL LUMBAR INTERBODY FUSION (TLIF) 1 LEVEL L5-S1;  Surgeon: [name], MD;  Location: [facility] ORS;  Service: Neurosurgery;  Laterality: N/A;   TYMPANOPLASTY Right 08/19/2023   Procedure: TYMPANOPLASTY WITH REMOVAL OF CHOLESTEATOMA;  Surgeon: Geanie Logan, MD;  Location: [facility];  Service: ENT;  Laterality: Right;     Current Outpatient Medications:    albuterol (VENTOLIN HFA) 108 (90 Base) MCG/ACT inhaler, Inhale 1-2 puffs into the lungs every 6 (six) hours as needed for wheezing or shortness of breath., Disp: , Rfl:    amitriptyline (ELAVIL) 50 MG tablet, Take 50 mg by mouth at bedtime., Disp: , Rfl:    ciprofloxacin-dexamethasone (CIPRODEX) OTIC suspension, Place 4 drops into both ears 2 (two) times daily for 5 days. DOS 08/19/23., Disp: 7.5 mL, Rfl: 0   citalopram (CELEXA) 40 MG tablet, Take 40 mg by mouth every morning. , Disp: , Rfl:    DULoxetine (CYMBALTA) 60 MG capsule, Take 60 mg by mouth 2 (two) times daily., Disp: , Rfl:    fluticasone (FLONASE) 50 MCG/ACT  nasal spray, Place 2 sprays into both nostrils daily., Disp: , Rfl:    isosorbide mononitrate (IMDUR) 30 MG 24 hr tablet, Take 30 mg by mouth daily., Disp: , Rfl:    meloxicam (MOBIC) 15 MG tablet, Take 15 mg by mouth every morning., Disp: , Rfl:    nitroGLYCERIN (NITROSTAT) 0.4 MG SL tablet, Place 0.4 mg under the tongue every 5 (five) minutes as needed for chest pain., Disp: , Rfl:    ofloxacin (OCUFLOX) 0.3 % ophthalmic solution, Place 4 drops into the left ear 2 (two) times daily., Disp: 5 mL, Rfl: 5   omeprazole (PRILOSEC) 20 MG capsule, Take 20 mg by mouth daily., Disp: , Rfl:    pregabalin (LYRICA) 300 MG capsule, Take 300 mg by mouth 2 (two) times daily., Disp: , Rfl:    rosuvastatin (CRESTOR) 20 MG tablet, Take 20 mg by mouth daily., Disp: , Rfl:    spironolactone (ALDACTONE) 25 MG tablet, Take 12.5 mg by mouth daily., Disp: , Rfl:    tamsulosin (FLOMAX) 0.4 MG CAPS capsule, Take 0.4 mg by mouth daily after supper., Disp: , Rfl:    valsartan (DIOVAN) 320 MG tablet, Take 320 mg by mouth daily., Disp: , Rfl:    No family history  on file.   Social History   Tobacco Use   Smoking status: Former    Current packs/day: 0.00    Average packs/day: 1 pack/day for 50.0 years (50.0 ttl pk-yrs)    Types: Cigarettes    Start date: 02/08/1968    Quit date: 02/07/2018    Years since quitting: 5.6   Smokeless tobacco: Never  Vaping Use   Vaping status: Former  Substance Use Topics   Alcohol use: Not Currently    Comment: rarely   Drug use: No    Allergies as of 09/15/2023 - Review Complete 09/15/2023  Allergen Reaction Noted   Penicillins Hives 07/23/2007   Atorvastatin Other (See Comments) 01/16/2016    Review of Systems:    All systems reviewed and negative except where noted in HPI.   Physical Exam:  BP (!) 147/77 (BP Location: Left Arm, Patient Position: Sitting, Cuff Size: Normal)   Pulse 60   Temp 97.7 F (36.5 C) (Oral)   Ht 5\' 11"  (1.803 m)   Wt 182 lb (82.6 kg)   BMI  25.38 kg/m  No LMP for male patient.  General:   Alert,  Well-developed, well-nourished, pleasant and cooperative in NAD Head:  Normocephalic and atraumatic. Eyes:  Sclera clear, no icterus.   Conjunctiva pink. Ears:  Normal auditory acuity. Nose:  No deformity, discharge, or lesions. Mouth:  No deformity or lesions,oropharynx pink & moist. Neck:  Supple; no masses or thyromegaly. Lungs:  Respirations even and unlabored.  Clear throughout to auscultation.   No wheezes, crackles, or rhonchi. No acute distress. Heart:  Regular rate and rhythm; no murmurs, clicks, rubs, or gallops. Abdomen:  Normal bowel sounds. Soft, non-tender and non-distended without masses, hepatosplenomegaly or hernias noted.  No guarding or rebound tenderness.   Rectal: Not performed Msk:  Symmetrical without gross deformities. Good, equal movement & strength bilaterally. Pulses:  Normal pulses noted. Extremities:  No clubbing or edema.  No cyanosis. Neurologic:  Alert and oriented x3;  grossly normal neurologically. Skin:  Intact without significant lesions or rashes. No jaundice. Psych:  Alert and cooperative. Normal mood and affect.  Imaging Studies: Reviewed  Assessment and Plan:   [name] is a 67 y.o. male with history of chronic tobacco use, diabetes, hypertension, hyperlipidemia, chronic back pain, coronary artery disease s/p CABG, depression is seen in consultation for compensated cirrhosis of liver secondary to [facility].  MASH Cirrhosis, well compensated secondary liver disease workup was unremarkable Recommend hepatitis A and B vaccine No evidence of varices on EGD Reiterated about low-sodium diet Right upper quadrant ultrasound for HCC surveillance and serum AFP levels  Mild vitamin D deficiency, s/p treatment Recheck vitamin D levels   Follow up in 6 months   [name], MD

## 2023-09-15 NOTE — Patient Instructions (Addendum)
Your ultrasound is schedule for   09/23/2023 arrive at 7:45am at 8:00am at outpatient imaging. Nothing to eat or drink after midnight. Address is [address], [place], [place] [address]. Phone number is [phone]. If you need to reschedule please call [phone] option 3 and then option 2.

## 2023-09-16 LAB — VITAMIN D 25 HYDROXY (VIT D DEFICIENCY, FRACTURES): Vit D, 25-Hydroxy: 31.4 ng/mL (ref 30.0–100.0)

## 2023-09-16 LAB — AFP TUMOR MARKER: AFP, Serum, Tumor Marker: 4.9 ng/mL (ref 0.0–8.4)

## 2023-09-23 ENCOUNTER — Ambulatory Visit: Payer: Medicare HMO

## 2023-09-25 ENCOUNTER — Ambulatory Visit
Admission: RE | Admit: 2023-09-25 | Discharge: 2023-09-25 | Disposition: A | Discharge status: Home / Self Care | Payer: Medicare HMO | Source: Ambulatory Visit | Attending: Gastroenterology | Admitting: Gastroenterology

## 2023-09-25 DIAGNOSIS — K7689 Other specified diseases of liver: Secondary | ICD-10-CM | POA: Diagnosis not present

## 2023-09-25 DIAGNOSIS — K7460 Unspecified cirrhosis of liver: Secondary | ICD-10-CM | POA: Insufficient documentation

## 2023-09-29 ENCOUNTER — Telehealth: Payer: Self-pay

## 2023-09-29 NOTE — Telephone Encounter (Signed)
-----   Message from [facility] sent at 09/26/2023  3:19 PM EDT ----- Ultrasound liver showed cirrhosis with no liver lesions.  Recommend repeat ultrasound liver in 6 months for HCC screening  RV

## 2023-09-29 NOTE — Telephone Encounter (Signed)
Put a 6 month a reminder for ultrasound. Patient verbalized understanding of results

## 2023-09-30 DIAGNOSIS — I25810 Atherosclerosis of coronary artery bypass graft(s) without angina pectoris: Secondary | ICD-10-CM | POA: Diagnosis not present

## 2023-09-30 DIAGNOSIS — I255 Ischemic cardiomyopathy: Secondary | ICD-10-CM | POA: Diagnosis not present

## 2023-09-30 DIAGNOSIS — E782 Mixed hyperlipidemia: Secondary | ICD-10-CM | POA: Diagnosis not present

## 2023-10-01 DIAGNOSIS — R0989 Other specified symptoms and signs involving the circulatory and respiratory systems: Secondary | ICD-10-CM | POA: Diagnosis not present

## 2023-10-31 DIAGNOSIS — H90A31 Mixed conductive and sensorineural hearing loss, unilateral, right ear with restricted hearing on the contralateral side: Secondary | ICD-10-CM | POA: Diagnosis not present

## 2023-11-12 DIAGNOSIS — I2510 Atherosclerotic heart disease of native coronary artery without angina pectoris: Secondary | ICD-10-CM | POA: Diagnosis not present

## 2023-11-12 DIAGNOSIS — M542 Cervicalgia: Secondary | ICD-10-CM | POA: Diagnosis not present

## 2023-11-12 DIAGNOSIS — R918 Other nonspecific abnormal finding of lung field: Secondary | ICD-10-CM | POA: Diagnosis not present

## 2023-11-12 DIAGNOSIS — I10 Essential (primary) hypertension: Secondary | ICD-10-CM | POA: Diagnosis not present

## 2023-11-12 DIAGNOSIS — J309 Allergic rhinitis, unspecified: Secondary | ICD-10-CM | POA: Diagnosis not present

## 2023-11-12 DIAGNOSIS — E1169 Type 2 diabetes mellitus with other specified complication: Secondary | ICD-10-CM | POA: Diagnosis not present

## 2023-11-12 DIAGNOSIS — E7800 Pure hypercholesterolemia, unspecified: Secondary | ICD-10-CM | POA: Diagnosis not present

## 2023-11-12 DIAGNOSIS — Z125 Encounter for screening for malignant neoplasm of prostate: Secondary | ICD-10-CM | POA: Diagnosis not present

## 2023-11-12 DIAGNOSIS — K7460 Unspecified cirrhosis of liver: Secondary | ICD-10-CM | POA: Diagnosis not present

## 2023-11-12 DIAGNOSIS — F324 Major depressive disorder, single episode, in partial remission: Secondary | ICD-10-CM | POA: Diagnosis not present

## 2023-11-12 DIAGNOSIS — G629 Polyneuropathy, unspecified: Secondary | ICD-10-CM | POA: Diagnosis not present

## 2023-11-12 DIAGNOSIS — L03011 Cellulitis of right finger: Secondary | ICD-10-CM | POA: Diagnosis not present

## 2023-11-12 DIAGNOSIS — G8929 Other chronic pain: Secondary | ICD-10-CM | POA: Diagnosis not present

## 2023-11-13 ENCOUNTER — Other Ambulatory Visit: Payer: Self-pay | Admitting: Physician Assistant

## 2023-11-13 DIAGNOSIS — R918 Other nonspecific abnormal finding of lung field: Secondary | ICD-10-CM

## 2023-11-24 DIAGNOSIS — Z9181 History of falling: Secondary | ICD-10-CM | POA: Diagnosis not present

## 2023-11-24 DIAGNOSIS — Z139 Encounter for screening, unspecified: Secondary | ICD-10-CM | POA: Diagnosis not present

## 2023-11-24 DIAGNOSIS — Z1331 Encounter for screening for depression: Secondary | ICD-10-CM | POA: Diagnosis not present

## 2023-11-24 DIAGNOSIS — Z0000 Encounter for general adult medical examination without abnormal findings: Secondary | ICD-10-CM | POA: Diagnosis not present

## 2023-11-25 ENCOUNTER — Ambulatory Visit
Admission: RE | Admit: 2023-11-25 | Discharge: 2023-11-25 | Disposition: A | Discharge status: Home / Self Care | Payer: Medicare HMO | Source: Ambulatory Visit | Attending: Physician Assistant | Admitting: Physician Assistant

## 2023-11-25 DIAGNOSIS — R918 Other nonspecific abnormal finding of lung field: Secondary | ICD-10-CM | POA: Diagnosis not present

## 2023-12-10 DIAGNOSIS — R918 Other nonspecific abnormal finding of lung field: Secondary | ICD-10-CM | POA: Diagnosis not present

## 2023-12-10 DIAGNOSIS — L03011 Cellulitis of right finger: Secondary | ICD-10-CM | POA: Diagnosis not present

## 2023-12-17 ENCOUNTER — Ambulatory Visit: Payer: Medicare HMO | Admitting: Student in an Organized Health Care Education/Training Program

## 2023-12-17 ENCOUNTER — Encounter: Payer: Self-pay | Admitting: Student in an Organized Health Care Education/Training Program

## 2023-12-17 ENCOUNTER — Institutional Professional Consult (permissible substitution): Payer: Medicare HMO | Admitting: Student in an Organized Health Care Education/Training Program

## 2023-12-17 VITALS — BP 122/66 | HR 66 | Temp 97.6°F | Ht 71.0 in | Wt 180.0 lb

## 2023-12-17 DIAGNOSIS — R911 Solitary pulmonary nodule: Secondary | ICD-10-CM | POA: Diagnosis not present

## 2023-12-17 NOTE — H&P (View-Only) (Signed)
Synopsis: Referred in pulmonary nodule by [name], PA-C  Assessment & Plan:   1. Lung nodule (Primary)  Nodule Location: LUL Nodule Size: 4.4 cm Nodule Spiculation: No Associated Lymphadenopathy: yes Smoking Status (current) and pack years: 55-60 Extrathoracic cancer > 5 years prior (no) SPN malignancy risk score [place]): 95 %risk of malignancy ECOG: 0  The patient is here to discuss their imaging abnormalities which include a LUL pulmonary nodule noted initially on LDCT for lung cancer screening, with growth on the interval CT. The risk for malignancy is very high based on the mayoclinic SPN model and he will require biopsy to establish the diagnosis. Will obtain a PET/CT for potential staging and a PFT to assess his lung volumes and DLCO should he be a candidate for surgery.  We discussed the importance of diagnosis and staging in lung malignancies, and the approach to obtaining a tissue diagnosis which would include robotic assisted navigational bronchoscopy with endobronchial ultrasound guided sampling.  We also discussed the risks associated with the procedure which include a 2% risk of pneumothorax, infection, bleeding, and nondiagnostic procedure in detail.  I explained that patients typically are able to return home the same day of the procedure, but in rare cases admission to the hospital for observation and treatment is required.  After our discussion, the patient elected to proceed with the procedure  - Procedural/ Surgical Case Request: ROBOTIC ASSISTED NAVIGATIONAL BRONCHOSCOPY; Future - CT SUPER D CHEST WO CONTRAST; Future - Ambulatory referral to Pulmonology - Pulmonary Function Test [facility] Only; Future - NM PET Image Initial (PI) Skull Base To Thigh (F-18 FDG); Future   Return in about 2 weeks (around 12/31/2023).  I spent 60 minutes caring for this patient today, including preparing to see the patient, obtaining a medical history , reviewing a  separately obtained history, performing a medically appropriate examination and/or evaluation, counseling and educating the patient/family/caregiver, ordering medications, tests, or procedures, documenting clinical information in the electronic health record, and independently interpreting results (not separately reported/billed) and communicating results to the patient/family/caregiver  Raechel Chute, MD [facility] 12/17/2023 12:10 PM    End of visit medications:  No orders of the defined types were placed in this encounter.    Current Outpatient Medications:    albuterol (VENTOLIN HFA) 108 (90 Base) MCG/ACT inhaler, Inhale 1-2 puffs into the lungs every 6 (six) hours as needed for wheezing or shortness of breath., Disp: , Rfl:    amitriptyline (ELAVIL) 50 MG tablet, Take 50 mg by mouth at bedtime., Disp: , Rfl:    ciprofloxacin-dexamethasone (CIPRODEX) OTIC suspension, Place 4 drops into both ears 2 (two) times daily for 5 days. DOS 08/19/23., Disp: 7.5 mL, Rfl: 0   citalopram (CELEXA) 40 MG tablet, Take 40 mg by mouth every morning. , Disp: , Rfl:    DULoxetine (CYMBALTA) 60 MG capsule, Take 60 mg by mouth 2 (two) times daily., Disp: , Rfl:    fluticasone (FLONASE) 50 MCG/ACT nasal spray, Place 2 sprays into both nostrils daily., Disp: , Rfl:    isosorbide mononitrate (IMDUR) 30 MG 24 hr tablet, Take 30 mg by mouth daily., Disp: , Rfl:    meloxicam (MOBIC) 15 MG tablet, Take 15 mg by mouth every morning., Disp: , Rfl:    nitroGLYCERIN (NITROSTAT) 0.4 MG SL tablet, Place 0.4 mg under the tongue every 5 (five) minutes as needed for chest pain., Disp: , Rfl:    ofloxacin (OCUFLOX) 0.3 % ophthalmic solution, Place 4  drops into the left ear 2 (two) times daily., Disp: 5 mL, Rfl: 5   omeprazole (PRILOSEC) 20 MG capsule, Take 20 mg by mouth daily., Disp: , Rfl:    pregabalin (LYRICA) 300 MG capsule, Take 300 mg by mouth 2 (two) times daily., Disp: , Rfl:    rosuvastatin  (CRESTOR) 20 MG tablet, Take 20 mg by mouth daily., Disp: , Rfl:    spironolactone (ALDACTONE) 25 MG tablet, Take 12.5 mg by mouth daily., Disp: , Rfl:    tamsulosin (FLOMAX) 0.4 MG CAPS capsule, Take 0.4 mg by mouth daily after supper., Disp: , Rfl:    valsartan (DIOVAN) 320 MG tablet, Take 320 mg by mouth daily., Disp: , Rfl:    Subjective:   PATIENT ID: [name] GENDER: male DOB: October 17, 1956, MRN: [id number]  Chief Complaint  Patient presents with   Consult    Shortness of breath on exertion. No cough or wheezing.    HPI  The patient is a pleasant 68 year old male with a past medical history of coronary artery disease as well as smoking who presents to clinic for the evaluation of a pulmonary nodule.  Patient was enrolled in low-dose CT for lung cancer screening by his primary care physician.  He had his initial CT scan in March 2024 that showed multiple subcentimeter pulmonary nodules with plans for repeat CT at 4-month mark.  Repeat CT was performed on 11/25/2023 showing enlargement of the left upper lobe pulmonary nodule, now measuring 4.4 cm.  He is presenting today for consideration of biopsy.  Patient reports a chronic cough that has been ongoing for years, sometimes productive of white/yellow sputum.  He has occasional exertional dyspnea.  Over the past few months, he is experienced night sweats as well as chills that have been around 2-3 times a week.  He has not had any weight changes nor has he experienced any rashes.  Past medical history is notable for coronary artery disease, with bypass surgery performed 18 years ago.  He is followed by [facility] cardiology.  He was last seen in October 2024 and prior to that in September 2024 for preoperative risk assessment.  At that time, he was felt to be low risk for procedural intervention.  Patient is also followed by gastroenterology with Dr. Allegra Lai, last seen in April 2024 in clinic and diagnosed with compensated cirrhosis of the liver  which is managed clinically.  He underwent EGD which did not show any varices but was notable for erythematous mucosa in the stomach and noted to have H. pylori which was treated.  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  He has a significant smoking history, having started smoking at the age of 71 with 2 packs/day at his peak.  He did quit for 4 years after his open heart surgery.  He probably has between 55 and 60 pack years of smoking history.  Ancillary information including prior medications, full medical/surgical/family/social histories, and PFTs (when available) are listed below and have been reviewed.   Review of Systems  Constitutional:  Positive for chills and diaphoresis. Negative for fever, malaise/fatigue and weight loss.  Respiratory:  Positive for cough and sputum production. Negative for hemoptysis, shortness of breath and wheezing.   Cardiovascular:  Negative for chest pain.     Objective:   Vitals:   12/17/23 1112  BP: 122/66  Pulse: 66  Temp: 97.6 F (  36.4 C)  TempSrc: Temporal  SpO2: 96%  Weight: 180 lb (81.6 kg)  Height: 5\' 11"  (1.803 m)   96% on RA  BMI Readings from Last 3 Encounters:  12/17/23 25.10 kg/m  09/15/23 25.38 kg/m  08/19/23 24.34 kg/m   Wt Readings from Last 3 Encounters:  12/17/23 180 lb (81.6 kg)  09/15/23 182 lb (82.6 kg)  08/19/23 174 lb 8 oz (79.2 kg)    Physical Exam Constitutional:      Appearance: Normal appearance.  Cardiovascular:     Rate and Rhythm: Normal rate and regular rhythm.     Pulses: Normal pulses.     Heart sounds: Normal heart sounds.  Pulmonary:     Effort: Pulmonary effort is normal.     Breath sounds: Normal breath sounds. No wheezing or rales.  Musculoskeletal:     Right lower leg: No edema.     Left lower leg: No edema.  Neurological:     General: No focal deficit present.     Mental Status: He is  alert and oriented to person, place, and time. Mental status is at baseline.     Ancillary Information    Past Medical History:  Diagnosis Date   Anemia    Anginal pain (HCC)    Arthritis    Atherosclerotic peripheral vascular disease with intermittent claudication (HCC)    Bronchitis    h/o   Cirrhosis, non-alcoholic (HCC)    Coronary artery disease    Depression    Diabetes mellitus without complication (HCC)    diet controlled   Difficult intubation    Pt Denies   Dyspnea    with exertion   GERD (gastroesophageal reflux disease)    Heart failure with mid-range ejection fraction (HFmEF) (HCC)    History of MRSA infection 2010   Hx of CABG    Hyperlipidemia    Hypertension    Insomnia    Ischemic cardiomyopathy    Peripheral vascular disease (HCC)    Stomach ulcer    Stomach ulcer    Wears hearing aid in both ears      No family history on file.   Past Surgical History:  Procedure Laterality Date   ANTERIOR CERVICAL DECOMP/DISCECTOMY FUSION N/A 01/28/2018   Procedure: ANTERIOR CERVICAL DECOMPRESSION/DISCECTOMY FUSION 2 LEVELS C3-5;  Surgeon: [name], MD;  Location: [facility] ORS;  Service: Neurosurgery;  Laterality: N/A;   COLON SURGERY  2004   due to diverticulitis.  Partail colectomy with Anastamosis   COLONOSCOPY N/A 03/27/2023   Procedure: COLONOSCOPY;  Surgeon: [name], MD;  Location: [facility]COPY;  Service: Gastroenterology;  Laterality: N/A;   COLONOSCOPY WITH PROPOFOL N/A 03/26/2023   Procedure: COLONOSCOPY WITH PROPOFOL;  Surgeon: [name], MD;  Location: [facility] ENDOSCOPY;  Service: Gastroenterology;  Laterality: N/A;   CORONARY ARTERY BYPASS GRAFT     ESOPHAGOGASTRODUODENOSCOPY (EGD) WITH PROPOFOL N/A 03/26/2023   Procedure: ESOPHAGOGASTRODUODENOSCOPY (EGD) WITH PROPOFOL;  Surgeon: [name], MD;  Location: [facility] ENDOSCOPY;  Service: Gastroenterology;  Laterality: N/A;   LUMBAR LAMINECTOMY/DECOMPRESSION  MICRODISCECTOMY N/A 09/01/2019   Procedure: LUMBAR LAMINECTOMY/DECOMPRESSION MICRODISCECTOMY 3 LEVELS L2-L5;  Surgeon: [name], MD;  Location: [facility] ORS;  Service: Neurosurgery;  Laterality: N/A;   neck fusion     SHOULDER ARTHROSCOPY WITH OPEN ROTATOR CUFF REPAIR Right 04/08/2016   Procedure: SHOULDER ARTHROSCOPY WITH OPEN ROTATOR CUFF REPAIR;  Surgeon: [name], MD;  Location: [facility] ORS;  Service: Orthopedics;  Laterality: Right;   TONSILLECTOMY  TRANSFORAMINAL LUMBAR INTERBODY FUSION W/ MIS 1 LEVEL N/A 09/01/2019   Procedure: MINIMALLY INVASIVE (MIS) TRANSFORAMINAL LUMBAR INTERBODY FUSION (TLIF) 1 LEVEL L5-S1;  Surgeon: [name], MD;  Location: [facility] ORS;  Service: Neurosurgery;  Laterality: N/A;   TYMPANOPLASTY Right 08/19/2023   Procedure: TYMPANOPLASTY WITH REMOVAL OF CHOLESTEATOMA;  Surgeon: Geanie Logan, MD;  Location: [facility];  Service: ENT;  Laterality: Right;    Social History   Socioeconomic History   Marital status: Married    Spouse name: Not on file   Number of children: Not on file   Years of education: Not on file   Highest education level: Not on file  Occupational History   Not on file  Tobacco Use   Smoking status: Former    Current packs/day: 0.00    Average packs/day: 1 pack/day for 50.0 years (50.0 ttl pk-yrs)    Types: Cigarettes    Start date: 02/08/1968    Quit date: 02/07/2018    Years since quitting: 5.8   Smokeless tobacco: Never  Vaping Use   Vaping status: Former  Substance and Sexual Activity   Alcohol use: Not Currently    Comment: rarely   Drug use: No   Sexual activity: Yes  Other Topics Concern   Not on file  Social History Narrative   Not on file   Social Drivers of Health   Financial Resource Strain: Not on file  Food Insecurity: Not on file  Transportation Needs: Not on file  Physical Activity: Not on file  Stress: Not on file  Social Connections: Not on file  Intimate Partner Violence: Not  on file     Allergies  Allergen Reactions   Penicillins Hives    Has patient had a PCN reaction causing immediate rash, facial/tongue/throat swelling, SOB or lightheadedness with hypotension: Yes Has patient had a PCN reaction causing severe rash involving mucus membranes or skin necrosis: Unknown Has patient had a PCN reaction that required hospitalization: Yes Has patient had a PCN reaction occurring within the last 10 years: No If all of the above answers are "NO", then may proceed with Cephalosporin use.    Atorvastatin Other (See Comments)    Body Aches     CBC    Component Value Date/Time   WBC 8.2 03/12/2023 1332   WBC 10.6 (H) 08/11/2019 0959   RBC 4.76 03/12/2023 1332   RBC 4.79 08/11/2019 0959   HGB 15.3 03/12/2023 1332   HCT 45.5 03/12/2023 1332   PLT 171 03/12/2023 1332   MCV 96 03/12/2023 1332   MCH 32.1 03/12/2023 1332   MCH 31.5 08/11/2019 0959   MCHC 33.6 03/12/2023 1332   MCHC 33.6 08/11/2019 0959   RDW 12.3 03/12/2023 1332   LYMPHSABS 3.5 01/20/2018 1454   MONOABS 0.5 01/20/2018 1454   EOSABS 0.4 01/20/2018 1454   BASOSABS 0.1 01/20/2018 1454    Pulmonary Functions Testing Results:     No data to display          Outpatient Medications Prior to Visit  Medication Sig Dispense Refill   albuterol (VENTOLIN HFA) 108 (90 Base) MCG/ACT inhaler Inhale 1-2 puffs into the lungs every 6 (six) hours as needed for wheezing or shortness of breath.     amitriptyline (ELAVIL) 50 MG tablet Take 50 mg by mouth at bedtime.     ciprofloxacin-dexamethasone (CIPRODEX) OTIC suspension Place 4 drops into both ears 2 (two) times daily for 5 days. DOS 08/19/23. 7.5 mL 0   citalopram (CELEXA)  40 MG tablet Take 40 mg by mouth every morning.      DULoxetine (CYMBALTA) 60 MG capsule Take 60 mg by mouth 2 (two) times daily.     fluticasone (FLONASE) 50 MCG/ACT nasal spray Place 2 sprays into both nostrils daily.     isosorbide mononitrate (IMDUR) 30 MG 24 hr tablet Take 30 mg  by mouth daily.     meloxicam (MOBIC) 15 MG tablet Take 15 mg by mouth every morning.     nitroGLYCERIN (NITROSTAT) 0.4 MG SL tablet Place 0.4 mg under the tongue every 5 (five) minutes as needed for chest pain.     ofloxacin (OCUFLOX) 0.3 % ophthalmic solution Place 4 drops into the left ear 2 (two) times daily. 5 mL 5   omeprazole (PRILOSEC) 20 MG capsule Take 20 mg by mouth daily.     pregabalin (LYRICA) 300 MG capsule Take 300 mg by mouth 2 (two) times daily.     rosuvastatin (CRESTOR) 20 MG tablet Take 20 mg by mouth daily.     spironolactone (ALDACTONE) 25 MG tablet Take 12.5 mg by mouth daily.     tamsulosin (FLOMAX) 0.4 MG CAPS capsule Take 0.4 mg by mouth daily after supper.     valsartan (DIOVAN) 320 MG tablet Take 320 mg by mouth daily.     No facility-administered medications prior to visit.

## 2023-12-17 NOTE — Progress Notes (Signed)
Synopsis: Referred in pulmonary nodule by [name], PA-C  Assessment & Plan:   1. Lung nodule (Primary)  Nodule Location: LUL Nodule Size: 4.4 cm Nodule Spiculation: No Associated Lymphadenopathy: yes Smoking Status (current) and pack years: 55-60 Extrathoracic cancer > 5 years prior (no) SPN malignancy risk score [facility]): 95 %risk of malignancy ECOG: 0  The patient is here to discuss their imaging abnormalities which include a LUL pulmonary nodule noted initially on LDCT for lung cancer screening, with growth on the interval CT. The risk for malignancy is very high based on the mayoclinic SPN model and he will require biopsy to establish the diagnosis. Will obtain a PET/CT for potential staging and a PFT to assess his lung volumes and DLCO should he be a candidate for surgery.  We discussed the importance of diagnosis and staging in lung malignancies, and the approach to obtaining a tissue diagnosis which would include robotic assisted navigational bronchoscopy with endobronchial ultrasound guided sampling.  We also discussed the risks associated with the procedure which include a 2% risk of pneumothorax, infection, bleeding, and nondiagnostic procedure in detail.  I explained that patients typically are able to return home the same day of the procedure, but in rare cases admission to the hospital for observation and treatment is required.  After our discussion, the patient elected to proceed with the procedure  - Procedural/ Surgical Case Request: ROBOTIC ASSISTED NAVIGATIONAL BRONCHOSCOPY; Future - CT SUPER D CHEST WO CONTRAST; Future - Ambulatory referral to Pulmonology - Pulmonary Function Test [facility] Only; Future - NM PET Image Initial (PI) Skull Base To Thigh (F-18 FDG); Future   Return in about 2 weeks (around 12/31/2023).  I spent 60 minutes caring for this patient today, including preparing to see the patient, obtaining a medical history , reviewing a  separately obtained history, performing a medically appropriate examination and/or evaluation, counseling and educating the patient/family/caregiver, ordering medications, tests, or procedures, documenting clinical information in the electronic health record, and independently interpreting results (not separately reported/billed) and communicating results to the patient/family/caregiver  Raechel Chute, MD  Pulmonary Critical Care 12/17/2023 12:10 PM    End of visit medications:  No orders of the defined types were placed in this encounter.    Current Outpatient Medications:    albuterol (VENTOLIN HFA) 108 (90 Base) MCG/ACT inhaler, Inhale 1-2 puffs into the lungs every 6 (six) hours as needed for wheezing or shortness of breath., Disp: , Rfl:    amitriptyline (ELAVIL) 50 MG tablet, Take 50 mg by mouth at bedtime., Disp: , Rfl:    ciprofloxacin-dexamethasone (CIPRODEX) OTIC suspension, Place 4 drops into both ears 2 (two) times daily for 5 days. DOS 08/19/23., Disp: 7.5 mL, Rfl: 0   citalopram (CELEXA) 40 MG tablet, Take 40 mg by mouth every morning. , Disp: , Rfl:    DULoxetine (CYMBALTA) 60 MG capsule, Take 60 mg by mouth 2 (two) times daily., Disp: , Rfl:    fluticasone (FLONASE) 50 MCG/ACT nasal spray, Place 2 sprays into both nostrils daily., Disp: , Rfl:    isosorbide mononitrate (IMDUR) 30 MG 24 hr tablet, Take 30 mg by mouth daily., Disp: , Rfl:    meloxicam (MOBIC) 15 MG tablet, Take 15 mg by mouth every morning., Disp: , Rfl:    nitroGLYCERIN (NITROSTAT) 0.4 MG SL tablet, Place 0.4 mg under the tongue every 5 (five) minutes as needed for chest pain., Disp: , Rfl:    ofloxacin (OCUFLOX) 0.3 % ophthalmic solution, Place 4  drops into the left ear 2 (two) times daily., Disp: 5 mL, Rfl: 5   omeprazole (PRILOSEC) 20 MG capsule, Take 20 mg by mouth daily., Disp: , Rfl:    pregabalin (LYRICA) 300 MG capsule, Take 300 mg by mouth 2 (two) times daily., Disp: , Rfl:    rosuvastatin  (CRESTOR) 20 MG tablet, Take 20 mg by mouth daily., Disp: , Rfl:    spironolactone (ALDACTONE) 25 MG tablet, Take 12.5 mg by mouth daily., Disp: , Rfl:    tamsulosin (FLOMAX) 0.4 MG CAPS capsule, Take 0.4 mg by mouth daily after supper., Disp: , Rfl:    valsartan (DIOVAN) 320 MG tablet, Take 320 mg by mouth daily., Disp: , Rfl:    Subjective:   PATIENT ID: [name] GENDER: male DOB: September 18, 1956, MRN: [id number]  Chief Complaint  Patient presents with   Consult    Shortness of breath on exertion. No cough or wheezing.    HPI  The patient is a pleasant 68 year old male with a past medical history of coronary artery disease as well as smoking who presents to clinic for the evaluation of a pulmonary nodule.  Patient was enrolled in low-dose CT for lung cancer screening by his primary care physician.  He had his initial CT scan in March 2024 that showed multiple subcentimeter pulmonary nodules with plans for repeat CT at 57-month mark.  Repeat CT was performed on 11/25/2023 showing enlargement of the left upper lobe pulmonary nodule, now measuring 4.4 cm.  He is presenting today for consideration of biopsy.  Patient reports a chronic cough that has been ongoing for years, sometimes productive of white/yellow sputum.  He has occasional exertional dyspnea.  Over the past few months, he is experienced night sweats as well as chills that have been around 2-3 times a week.  He has not had any weight changes nor has he experienced any rashes.  Past medical history is notable for coronary artery disease, with bypass surgery performed 18 years ago.  He is followed by [facility] cardiology.  He was last seen in October 2024 and prior to that in September 2024 for preoperative risk assessment.  At that time, he was felt to be low risk for procedural intervention.  Patient is also followed by gastroenterology with Dr. Allegra Lai, last seen in April 2024 in clinic and diagnosed with compensated cirrhosis of the liver  which is managed clinically.  He underwent EGD which did not show any varices but was notable for erythematous mucosa in the stomach and noted to have H. pylori which was treated.  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  He has a significant smoking history, having started smoking at the age of 56 with 2 packs/day at his peak.  He did quit for 4 years after his open heart surgery.  He probably has between 55 and 60 pack years of smoking history.  Ancillary information including prior medications, full medical/surgical/family/social histories, and PFTs (when available) are listed below and have been reviewed.   Review of Systems  Constitutional:  Positive for chills and diaphoresis. Negative for fever, malaise/fatigue and weight loss.  Respiratory:  Positive for cough and sputum production. Negative for hemoptysis, shortness of breath and wheezing.   Cardiovascular:  Negative for chest pain.     Objective:   Vitals:   12/17/23 1112  BP: 122/66  Pulse: 66  Temp: 97.6 F (  36.4 C)  TempSrc: Temporal  SpO2: 96%  Weight: 180 lb (81.6 kg)  Height: 5\' 11"  (1.803 m)   96% on RA  BMI Readings from Last 3 Encounters:  12/17/23 25.10 kg/m  09/15/23 25.38 kg/m  08/19/23 24.34 kg/m   Wt Readings from Last 3 Encounters:  12/17/23 180 lb (81.6 kg)  09/15/23 182 lb (82.6 kg)  08/19/23 174 lb 8 oz (79.2 kg)    Physical Exam Constitutional:      Appearance: Normal appearance.  Cardiovascular:     Rate and Rhythm: Normal rate and regular rhythm.     Pulses: Normal pulses.     Heart sounds: Normal heart sounds.  Pulmonary:     Effort: Pulmonary effort is normal.     Breath sounds: Normal breath sounds. No wheezing or rales.  Musculoskeletal:     Right lower leg: No edema.     Left lower leg: No edema.  Neurological:     General: No focal deficit present.     Mental Status: He is  alert and oriented to person, place, and time. Mental status is at baseline.     Ancillary Information    Past Medical History:  Diagnosis Date   Anemia    Anginal pain (HCC)    Arthritis    Atherosclerotic peripheral vascular disease with intermittent claudication (HCC)    Bronchitis    h/o   Cirrhosis, non-alcoholic (HCC)    Coronary artery disease    Depression    Diabetes mellitus without complication (HCC)    diet controlled   Difficult intubation    Pt Denies   Dyspnea    with exertion   GERD (gastroesophageal reflux disease)    Heart failure with mid-range ejection fraction (HFmEF) (HCC)    History of MRSA infection 2010   Hx of CABG    Hyperlipidemia    Hypertension    Insomnia    Ischemic cardiomyopathy    Peripheral vascular disease (HCC)    Stomach ulcer    Stomach ulcer    Wears hearing aid in both ears      No family history on file.   Past Surgical History:  Procedure Laterality Date   ANTERIOR CERVICAL DECOMP/DISCECTOMY FUSION N/A 01/28/2018   Procedure: ANTERIOR CERVICAL DECOMPRESSION/DISCECTOMY FUSION 2 LEVELS C3-5;  Surgeon: [name], MD;  Location: [facility] ORS;  Service: Neurosurgery;  Laterality: N/A;   COLON SURGERY  2004   due to diverticulitis.  Partail colectomy with Anastamosis   COLONOSCOPY N/A 03/27/2023   Procedure: COLONOSCOPY;  Surgeon: [name], MD;  Location: [facility] ENDOSCOPY;  Service: Gastroenterology;  Laterality: N/A;   COLONOSCOPY WITH PROPOFOL N/A 03/26/2023   Procedure: COLONOSCOPY WITH PROPOFOL;  Surgeon: [name], MD;  Location: [facility] ENDOSCOPY;  Service: Gastroenterology;  Laterality: N/A;   CORONARY ARTERY BYPASS GRAFT     ESOPHAGOGASTRODUODENOSCOPY (EGD) WITH PROPOFOL N/A 03/26/2023   Procedure: ESOPHAGOGASTRODUODENOSCOPY (EGD) WITH PROPOFOL;  Surgeon: [name], MD;  Location: [facility] ENDOSCOPY;  Service: Gastroenterology;  Laterality: N/A;   LUMBAR LAMINECTOMY/DECOMPRESSION  MICRODISCECTOMY N/A 09/01/2019   Procedure: LUMBAR LAMINECTOMY/DECOMPRESSION MICRODISCECTOMY 3 LEVELS L2-L5;  Surgeon: [name], MD;  Location: [facility] ORS;  Service: Neurosurgery;  Laterality: N/A;   neck fusion     SHOULDER ARTHROSCOPY WITH OPEN ROTATOR CUFF REPAIR Right 04/08/2016   Procedure: SHOULDER ARTHROSCOPY WITH OPEN ROTATOR CUFF REPAIR;  Surgeon: [name], MD;  Location: [facility] ORS;  Service: Orthopedics;  Laterality: Right;   TONSILLECTOMY  TRANSFORAMINAL LUMBAR INTERBODY FUSION W/ MIS 1 LEVEL N/A 09/01/2019   Procedure: MINIMALLY INVASIVE (MIS) TRANSFORAMINAL LUMBAR INTERBODY FUSION (TLIF) 1 LEVEL L5-S1;  Surgeon: [name], MD;  Location: [facility] ORS;  Service: Neurosurgery;  Laterality: N/A;   TYMPANOPLASTY Right 08/19/2023   Procedure: TYMPANOPLASTY WITH REMOVAL OF CHOLESTEATOMA;  Surgeon: Geanie Logan, MD;  Location: [facility];  Service: ENT;  Laterality: Right;    Social History   Socioeconomic History   Marital status: Married    Spouse name: Not on file   Number of children: Not on file   Years of education: Not on file   Highest education level: Not on file  Occupational History   Not on file  Tobacco Use   Smoking status: Former    Current packs/day: 0.00    Average packs/day: 1 pack/day for 50.0 years (50.0 ttl pk-yrs)    Types: Cigarettes    Start date: 02/08/1968    Quit date: 02/07/2018    Years since quitting: 5.8   Smokeless tobacco: Never  Vaping Use   Vaping status: Former  Substance and Sexual Activity   Alcohol use: Not Currently    Comment: rarely   Drug use: No   Sexual activity: Yes  Other Topics Concern   Not on file  Social History Narrative   Not on file   Social Drivers of Health   Financial Resource Strain: Not on file  Food Insecurity: Not on file  Transportation Needs: Not on file  Physical Activity: Not on file  Stress: Not on file  Social Connections: Not on file  Intimate Partner Violence: Not  on file     Allergies  Allergen Reactions   Penicillins Hives    Has patient had a PCN reaction causing immediate rash, facial/tongue/throat swelling, SOB or lightheadedness with hypotension: Yes Has patient had a PCN reaction causing severe rash involving mucus membranes or skin necrosis: Unknown Has patient had a PCN reaction that required hospitalization: Yes Has patient had a PCN reaction occurring within the last 10 years: No If all of the above answers are "NO", then may proceed with Cephalosporin use.    Atorvastatin Other (See Comments)    Body Aches     CBC    Component Value Date/Time   WBC 8.2 03/12/2023 1332   WBC 10.6 (H) 08/11/2019 0959   RBC 4.76 03/12/2023 1332   RBC 4.79 08/11/2019 0959   HGB 15.3 03/12/2023 1332   HCT 45.5 03/12/2023 1332   PLT 171 03/12/2023 1332   MCV 96 03/12/2023 1332   MCH 32.1 03/12/2023 1332   MCH 31.5 08/11/2019 0959   MCHC 33.6 03/12/2023 1332   MCHC 33.6 08/11/2019 0959   RDW 12.3 03/12/2023 1332   LYMPHSABS 3.5 01/20/2018 1454   MONOABS 0.5 01/20/2018 1454   EOSABS 0.4 01/20/2018 1454   BASOSABS 0.1 01/20/2018 1454    Pulmonary Functions Testing Results:     No data to display          Outpatient Medications Prior to Visit  Medication Sig Dispense Refill   albuterol (VENTOLIN HFA) 108 (90 Base) MCG/ACT inhaler Inhale 1-2 puffs into the lungs every 6 (six) hours as needed for wheezing or shortness of breath.     amitriptyline (ELAVIL) 50 MG tablet Take 50 mg by mouth at bedtime.     ciprofloxacin-dexamethasone (CIPRODEX) OTIC suspension Place 4 drops into both ears 2 (two) times daily for 5 days. DOS 08/19/23. 7.5 mL 0   citalopram (CELEXA)  40 MG tablet Take 40 mg by mouth every morning.      DULoxetine (CYMBALTA) 60 MG capsule Take 60 mg by mouth 2 (two) times daily.     fluticasone (FLONASE) 50 MCG/ACT nasal spray Place 2 sprays into both nostrils daily.     isosorbide mononitrate (IMDUR) 30 MG 24 hr tablet Take 30 mg  by mouth daily.     meloxicam (MOBIC) 15 MG tablet Take 15 mg by mouth every morning.     nitroGLYCERIN (NITROSTAT) 0.4 MG SL tablet Place 0.4 mg under the tongue every 5 (five) minutes as needed for chest pain.     ofloxacin (OCUFLOX) 0.3 % ophthalmic solution Place 4 drops into the left ear 2 (two) times daily. 5 mL 5   omeprazole (PRILOSEC) 20 MG capsule Take 20 mg by mouth daily.     pregabalin (LYRICA) 300 MG capsule Take 300 mg by mouth 2 (two) times daily.     rosuvastatin (CRESTOR) 20 MG tablet Take 20 mg by mouth daily.     spironolactone (ALDACTONE) 25 MG tablet Take 12.5 mg by mouth daily.     tamsulosin (FLOMAX) 0.4 MG CAPS capsule Take 0.4 mg by mouth daily after supper.     valsartan (DIOVAN) 320 MG tablet Take 320 mg by mouth daily.     No facility-administered medications prior to visit.

## 2023-12-24 ENCOUNTER — Encounter (HOSPITAL_COMMUNITY): Payer: Self-pay | Admitting: Student in an Organized Health Care Education/Training Program

## 2023-12-24 ENCOUNTER — Other Ambulatory Visit: Payer: Self-pay

## 2023-12-24 ENCOUNTER — Ambulatory Visit
Admission: RE | Admit: 2023-12-24 | Discharge: 2023-12-24 | Disposition: A | Discharge status: Home / Self Care | Payer: Medicare HMO | Source: Ambulatory Visit | Attending: Student in an Organized Health Care Education/Training Program | Admitting: Student in an Organized Health Care Education/Training Program

## 2023-12-24 ENCOUNTER — Telehealth: Payer: Self-pay | Admitting: Student in an Organized Health Care Education/Training Program

## 2023-12-24 DIAGNOSIS — R911 Solitary pulmonary nodule: Secondary | ICD-10-CM

## 2023-12-24 LAB — GLUCOSE, CAPILLARY: Glucose-Capillary: 179 mg/dL — ABNORMAL HIGH (ref 70–99)

## 2023-12-24 MED ORDER — FLUDEOXYGLUCOSE F - 18 (FDG) INJECTION
9.3000 | Freq: Once | INTRAVENOUS | Status: AC | PRN
  Administered 2023-12-24: 10.2 via INTRAVENOUS

## 2023-12-24 NOTE — Telephone Encounter (Signed)
Pre services is asking for CPT codes. You can call her at [phone] ext [phone] or email it to [email or web address]

## 2023-12-24 NOTE — Telephone Encounter (Signed)
Called Star back and left her a voicemail with the CPT code.  Nothing further needed.

## 2023-12-24 NOTE — Anesthesia Preprocedure Evaluation (Signed)
Anesthesia Evaluation  Patient identified by MRN, date of birth, ID band Patient awake    Reviewed: Allergy & Precautions, NPO status , Patient's Chart, lab work & pertinent test results  History of Anesthesia Complications (+) DIFFICULT AIRWAY and history of anesthetic complications (last airway note: Grade 1 view with Mcgrath 4)  Airway Mallampati: II  TM Distance: >3 FB Neck ROM: Full    Dental  (+) Edentulous Upper, Edentulous Lower, Dental Advisory Given   Pulmonary Current Smoker and Patient abstained from smoking.   Pulmonary exam normal breath sounds clear to auscultation       Cardiovascular hypertension, + angina  + CAD, + CABG, + Peripheral Vascular Disease and +CHF  Normal cardiovascular exam Rhythm:Regular Rate:Normal  Lexiscan with Myoview 07/21/23 showed normal LV function EF 56%, no regional wall abnormalities, no e/o scar or ischemia.    ECHO 07/21/23 with EF 45-50% and septal hypocontractility, trivial valvular regurg, no valvular stenosis. No significant change when compared to 2022 ECHO.     Neuro/Psych  PSYCHIATRIC DISORDERS  Depression    negative neurological ROS     GI/Hepatic PUD,GERD  ,,(+) Cirrhosis         Endo/Other  diabetes, Well Controlled, Type 2    Renal/GU negative Renal ROS  negative genitourinary   Musculoskeletal  (+) Arthritis ,  S/p ACDF   Abdominal   Peds  Hematology negative hematology ROS (+)   Anesthesia Other Findings   Reproductive/Obstetrics                             Anesthesia Physical Anesthesia Plan  ASA: 3  Anesthesia Plan: General   Post-op Pain Management: Minimal or no pain anticipated   Induction: Intravenous  PONV Risk Score and Plan: 1 and Midazolam, Dexamethasone and Ondansetron  Airway Management Planned: Oral ETT and Video Laryngoscope Planned  Additional Equipment:   Intra-op Plan:   Post-operative Plan:  Extubation in OR  Informed Consent: I have reviewed the patients History and Physical, chart, labs and discussed the procedure including the risks, benefits and alternatives for the proposed anesthesia with the patient or authorized representative who has indicated his/her understanding and acceptance.     Dental advisory given  Plan Discussed with: CRNA  Anesthesia Plan Comments:        Anesthesia Quick Evaluation

## 2023-12-24 NOTE — Progress Notes (Signed)
SDW CALL  Patient was given pre-op instructions over the phone. The opportunity was given for the patient to ask questions. No further questions asked. Patient verbalized understanding of instructions given.   PCP - [name] in [place] Cardiologist - Shirley Muscat  PPM/ICD - denies Device Orders - n/a Rep Notified - n/a  Chest x-ray - denies Patient is scheduled for his chest CT today EKG - 10/24 - requested tracing from cardiologist Stress Test - 8/24 - CE ECHO - 8/24 - CE Cardiac Cath -   Sleep Study - denies CPAP - n/a  Patient does not check CBG at home  Last dose of GLP1 agonist-  n/a GLP1 instructions: n/a  Blood Thinner Instructions: n/a Aspirin Instructions:  per patient, last dose of Aspirin was 1/27 and he was told to hold it for 5 days.  Dr. Aundria Rud notified that patient's last dose of Aspiring was 1/27 and he stated that it should not be an issue.  No new orders received.   ERAS Protcol - NPO PRE-SURGERY Ensure or G2- n/a  COVID TEST- n/a   Anesthesia review: yes - sent to [facility] on 1/29 for cardiac history and difficult intubation flag.  Patient stated that he was not aware of a difficult intubation.    Patient denies shortness of breath, fever, cough and chest pain over the phone call   All instructions explained to the patient, with a verbal understanding of the material. Patient agrees to go over the instructions while at home for a better understanding.

## 2023-12-25 ENCOUNTER — Encounter (HOSPITAL_COMMUNITY)
Admission: RE | Disposition: A | Discharge status: Home / Self Care | Payer: Self-pay | Source: Home / Self Care | Attending: Student in an Organized Health Care Education/Training Program

## 2023-12-25 ENCOUNTER — Encounter (HOSPITAL_COMMUNITY): Payer: Self-pay | Admitting: Student in an Organized Health Care Education/Training Program

## 2023-12-25 ENCOUNTER — Ambulatory Visit (HOSPITAL_COMMUNITY): Payer: Medicare HMO

## 2023-12-25 ENCOUNTER — Ambulatory Visit (HOSPITAL_BASED_OUTPATIENT_CLINIC_OR_DEPARTMENT_OTHER): Payer: Self-pay | Admitting: Physician Assistant

## 2023-12-25 ENCOUNTER — Ambulatory Visit (HOSPITAL_COMMUNITY)
Admission: RE | Admit: 2023-12-25 | Discharge: 2023-12-25 | Disposition: A | Discharge status: Home / Self Care | Payer: Medicare HMO | Attending: Student in an Organized Health Care Education/Training Program | Admitting: Student in an Organized Health Care Education/Training Program

## 2023-12-25 ENCOUNTER — Other Ambulatory Visit: Payer: Self-pay

## 2023-12-25 ENCOUNTER — Ambulatory Visit (HOSPITAL_COMMUNITY): Payer: Self-pay | Admitting: Physician Assistant

## 2023-12-25 DIAGNOSIS — Z01818 Encounter for other preprocedural examination: Secondary | ICD-10-CM

## 2023-12-25 DIAGNOSIS — I2510 Atherosclerotic heart disease of native coronary artery without angina pectoris: Secondary | ICD-10-CM | POA: Diagnosis not present

## 2023-12-25 DIAGNOSIS — K219 Gastro-esophageal reflux disease without esophagitis: Secondary | ICD-10-CM | POA: Diagnosis not present

## 2023-12-25 DIAGNOSIS — Z981 Arthrodesis status: Secondary | ICD-10-CM | POA: Diagnosis not present

## 2023-12-25 DIAGNOSIS — I110 Hypertensive heart disease with heart failure: Secondary | ICD-10-CM | POA: Diagnosis not present

## 2023-12-25 DIAGNOSIS — Z951 Presence of aortocoronary bypass graft: Secondary | ICD-10-CM | POA: Insufficient documentation

## 2023-12-25 DIAGNOSIS — M1990 Unspecified osteoarthritis, unspecified site: Secondary | ICD-10-CM | POA: Insufficient documentation

## 2023-12-25 DIAGNOSIS — R911 Solitary pulmonary nodule: Secondary | ICD-10-CM

## 2023-12-25 DIAGNOSIS — C3412 Malignant neoplasm of upper lobe, left bronchus or lung: Secondary | ICD-10-CM | POA: Insufficient documentation

## 2023-12-25 DIAGNOSIS — E1151 Type 2 diabetes mellitus with diabetic peripheral angiopathy without gangrene: Secondary | ICD-10-CM | POA: Diagnosis not present

## 2023-12-25 DIAGNOSIS — I25119 Atherosclerotic heart disease of native coronary artery with unspecified angina pectoris: Secondary | ICD-10-CM | POA: Diagnosis not present

## 2023-12-25 DIAGNOSIS — I509 Heart failure, unspecified: Secondary | ICD-10-CM | POA: Diagnosis not present

## 2023-12-25 DIAGNOSIS — I5020 Unspecified systolic (congestive) heart failure: Secondary | ICD-10-CM | POA: Diagnosis not present

## 2023-12-25 DIAGNOSIS — F32A Depression, unspecified: Secondary | ICD-10-CM | POA: Diagnosis not present

## 2023-12-25 DIAGNOSIS — K7469 Other cirrhosis of liver: Secondary | ICD-10-CM

## 2023-12-25 DIAGNOSIS — Z8711 Personal history of peptic ulcer disease: Secondary | ICD-10-CM | POA: Diagnosis not present

## 2023-12-25 DIAGNOSIS — I10 Essential (primary) hypertension: Secondary | ICD-10-CM

## 2023-12-25 DIAGNOSIS — E119 Type 2 diabetes mellitus without complications: Secondary | ICD-10-CM

## 2023-12-25 DIAGNOSIS — K7460 Unspecified cirrhosis of liver: Secondary | ICD-10-CM | POA: Diagnosis not present

## 2023-12-25 DIAGNOSIS — Z48813 Encounter for surgical aftercare following surgery on the respiratory system: Secondary | ICD-10-CM | POA: Diagnosis not present

## 2023-12-25 HISTORY — DX: Heart failure, unspecified: I50.9

## 2023-12-25 HISTORY — PX: BRONCHIAL NEEDLE ASPIRATION BIOPSY: SHX5106

## 2023-12-25 HISTORY — DX: Unspecified cirrhosis of liver: K74.60

## 2023-12-25 HISTORY — PX: BRONCHIAL BRUSHINGS: SHX5108

## 2023-12-25 HISTORY — PX: VIDEO BRONCHOSCOPY WITH RADIAL ENDOBRONCHIAL ULTRASOUND: SHX6849

## 2023-12-25 HISTORY — PX: BRONCHIAL WASHINGS: SHX5105

## 2023-12-25 HISTORY — PX: VIDEO BRONCHOSCOPY WITH ENDOBRONCHIAL ULTRASOUND: SHX6177

## 2023-12-25 HISTORY — PX: FINE NEEDLE ASPIRATION: SHX5430

## 2023-12-25 HISTORY — PX: BRONCHIAL BIOPSY: SHX5109

## 2023-12-25 LAB — CBC
HCT: 43.3 % (ref 39.0–52.0)
Hemoglobin: 14.4 g/dL (ref 13.0–17.0)
MCH: 31.9 pg (ref 26.0–34.0)
MCHC: 33.3 g/dL (ref 30.0–36.0)
MCV: 95.8 fL (ref 80.0–100.0)
Platelets: 194 10*3/uL (ref 150–400)
RBC: 4.52 MIL/uL (ref 4.22–5.81)
RDW: 13.1 % (ref 11.5–15.5)
WBC: 9.7 10*3/uL (ref 4.0–10.5)
nRBC: 0 % (ref 0.0–0.2)

## 2023-12-25 LAB — COMPREHENSIVE METABOLIC PANEL
ALT: 32 U/L (ref 0–44)
AST: 36 U/L (ref 15–41)
Albumin: 3.7 g/dL (ref 3.5–5.0)
Alkaline Phosphatase: 120 U/L (ref 38–126)
Anion gap: 11 (ref 5–15)
BUN: 12 mg/dL (ref 8–23)
CO2: 23 mmol/L (ref 22–32)
Calcium: 8.6 mg/dL — ABNORMAL LOW (ref 8.9–10.3)
Chloride: 102 mmol/L (ref 98–111)
Creatinine, Ser: 1.1 mg/dL (ref 0.61–1.24)
GFR, Estimated: >60 mL/min (ref >60)
Glucose, Bld: 196 mg/dL — ABNORMAL HIGH (ref 70–99)
Potassium: 3.8 mmol/L (ref 3.5–5.1)
Sodium: 136 mmol/L (ref 135–145)
Total Bilirubin: 0.7 mg/dL (ref 0.0–1.2)
Total Protein: 7 g/dL (ref 6.5–8.1)

## 2023-12-25 LAB — GLUCOSE, CAPILLARY
Glucose-Capillary: 187 mg/dL — ABNORMAL HIGH (ref 70–99)
Glucose-Capillary: 158 mg/dL — ABNORMAL HIGH (ref 70–99)

## 2023-12-25 SURGERY — BRONCHOSCOPY, WITH BIOPSY USING ELECTROMAGNETIC NAVIGATION
Anesthesia: General | Laterality: Bilateral

## 2023-12-25 MED ORDER — CHLORHEXIDINE GLUCONATE 0.12 % MT SOLN
15.0000 mL | Freq: Once | OROMUCOSAL | Status: AC
  Administered 2023-12-25: 15 mL via OROMUCOSAL

## 2023-12-25 MED ORDER — ROCURONIUM BROMIDE 10 MG/ML (PF) SYRINGE
PREFILLED_SYRINGE | INTRAVENOUS | Status: DC | PRN
  Administered 2023-12-25: 50 mg via INTRAVENOUS
  Administered 2023-12-25: 20 mg via INTRAVENOUS

## 2023-12-25 MED ORDER — IPRATROPIUM-ALBUTEROL 0.5-2.5 (3) MG/3ML IN SOLN: 3.0000 mL | RESPIRATORY_TRACT | Status: DC

## 2023-12-25 MED ORDER — EPHEDRINE SULFATE-NACL 50-0.9 MG/10ML-% IV SOSY
PREFILLED_SYRINGE | INTRAVENOUS | Status: DC | PRN
  Administered 2023-12-25 (×2): 10 mg via INTRAVENOUS

## 2023-12-25 MED ORDER — PROPOFOL 10 MG/ML IV BOLUS
INTRAVENOUS | Status: DC | PRN
  Administered 2023-12-25: 70 mg via INTRAVENOUS
  Administered 2023-12-25: 100 ug/kg/min via INTRAVENOUS
  Administered 2023-12-25: 130 mg via INTRAVENOUS

## 2023-12-25 MED ORDER — PHENYLEPHRINE HCL-NACL 20-0.9 MG/250ML-% IV SOLN
INTRAVENOUS | Status: DC | PRN
  Administered 2023-12-25: 60 ug/min via INTRAVENOUS

## 2023-12-25 MED ORDER — LIDOCAINE 2% (20 MG/ML) 5 ML SYRINGE
INTRAMUSCULAR | Status: DC | PRN
  Administered 2023-12-25: 50 mg via INTRAVENOUS

## 2023-12-25 MED ORDER — ONDANSETRON HCL 4 MG/2ML IJ SOLN
INTRAMUSCULAR | Status: DC | PRN
  Administered 2023-12-25: 4 mg via INTRAVENOUS

## 2023-12-25 MED ORDER — INSULIN ASPART 100 UNIT/ML IJ SOLN
INTRAMUSCULAR | Status: AC
  Filled 2023-12-25: qty 1

## 2023-12-25 MED ORDER — DEXAMETHASONE SODIUM PHOSPHATE 10 MG/ML IJ SOLN
INTRAMUSCULAR | Status: DC | PRN
  Administered 2023-12-25: 4 mg via INTRAVENOUS

## 2023-12-25 MED ORDER — FENTANYL CITRATE (PF) 250 MCG/5ML IJ SOLN
INTRAMUSCULAR | Status: DC | PRN
  Administered 2023-12-25 (×2): 50 ug via INTRAVENOUS

## 2023-12-25 MED ORDER — SUGAMMADEX SODIUM 200 MG/2ML IV SOLN
INTRAVENOUS | Status: DC | PRN
  Administered 2023-12-25: 200 mg via INTRAVENOUS

## 2023-12-25 MED ORDER — FENTANYL CITRATE (PF) 100 MCG/2ML IJ SOLN
INTRAMUSCULAR | Status: AC
  Filled 2023-12-25: qty 2

## 2023-12-25 MED ORDER — LACTATED RINGERS IV SOLN: INTRAVENOUS | Status: DC

## 2023-12-25 MED ORDER — INSULIN ASPART 100 UNIT/ML IJ SOLN
0.0000 [IU] | INTRAMUSCULAR | Status: DC | PRN
  Administered 2023-12-25: 2 [IU] via SUBCUTANEOUS

## 2023-12-25 NOTE — Interval H&P Note (Signed)
S: Feels well, no new symptoms O: Vitals:   12/25/23 0717  BP: (!) 149/73  Pulse: 80  Resp: 18  Temp: 97.7 F (36.5 C)  SpO2: 96%   General: Well-appearing and in no distress. Well-nourished Eyes: Anicteric, no conjunctival pallor HEENT: Mucous membranes moist Respiratory: Trachea is midline, no respiratory distress, good bilateral air entry, no wheezes, rales, or rhonchi Cardiovascular: Heart with regular rate and rhythm, normal S1 and S2, no murmurs, rubs, or gallops Neuro: Alert and oriented, no gross focal deficits  A/P: 68 year old male presenting with LUL nodule that is FDG avid on PET/CT. Plan for robotic assisted navigational bronchoscopy with EBUS for staging of the mediastinum. Patient is appropriate for the procedure.  Raechel Chute, MD [place] [facility] 12/25/2023 9:10 AM

## 2023-12-25 NOTE — Anesthesia Procedure Notes (Signed)
Procedure Name: Intubation Date/Time: 12/25/2023 9:48 AM  Performed by: Debbe Odea, CRNAPre-anesthesia Checklist: Patient identified, Emergency Drugs available, Suction available and Patient being monitored Patient Re-evaluated:Patient Re-evaluated prior to induction Oxygen Delivery Method: Circle System Utilized Preoxygenation: Pre-oxygenation with 100% oxygen Induction Type: IV induction Ventilation: Mask ventilation without difficulty and Oral airway inserted - appropriate to patient size Laryngoscope Size: Glidescope and 3 (hyperangulated S3) Grade View: Grade I Tube type: Oral Tube size: 8.5 mm Number of attempts: 1 Airway Equipment and Method: Stylet and Oral airway Placement Confirmation: ETT inserted through vocal cords under direct vision, positive ETCO2 and breath sounds checked- equal and bilateral Secured at: 24 cm Tube secured with: Tape Dental Injury: Teeth and Oropharynx as per pre-operative assessment

## 2023-12-25 NOTE — Anesthesia Postprocedure Evaluation (Signed)
Anesthesia Post Note  Patient: [name]  Procedure(s) Performed: ROBOTIC ASSISTED NAVIGATIONAL BRONCHOSCOPY (Bilateral) VIDEO BRONCHOSCOPY WITH RADIAL ENDOBRONCHIAL ULTRASOUND BRONCHIAL NEEDLE ASPIRATION BIOPSIES BRONCHIAL BRUSHINGS BRONCHIAL BIOPSIES VIDEO BRONCHOSCOPY WITH ENDOBRONCHIAL ULTRASOUND BRONCHIAL WASHINGS     Patient location during evaluation: PACU Anesthesia Type: General Level of consciousness: awake and alert Pain management: pain level controlled Vital Signs Assessment: post-procedure vital signs reviewed and stable Respiratory status: spontaneous breathing, nonlabored ventilation, respiratory function stable and patient connected to nasal cannula oxygen Cardiovascular status: blood pressure returned to baseline and stable Postop Assessment: no apparent nausea or vomiting Anesthetic complications: no  No notable events documented.  Last Vitals:  Vitals:   12/25/23 1200 12/25/23 1215  BP: 107/65 106/63  Pulse: 60 (!) 57  Resp: 16 13  Temp:  36.7 C  SpO2: 91% 93%    Last Pain:  Vitals:   12/25/23 1215  TempSrc:   PainSc: 0-No pain                 [name] [name]

## 2023-12-25 NOTE — Op Note (Signed)
Video Bronchoscopy with Robotic Assisted Bronchoscopic Navigation   Date of Operation: 12/25/2023   Pre-op Diagnosis: Lung nodule  Surgeon: Raechel Chute, MD  Anesthesia: General endotracheal anesthesia  Operation: Flexible video fiberoptic bronchoscopy with robotic assistance and biopsies.  Estimated Blood Loss: Minimal  Complications: None  Indications and History: [name] is a 68 y.o. male with history of lung nodule presenting for biopsy. The risks, benefits, complications, treatment options and expected outcomes were discussed with the patient.  The possibilities of pneumothorax, pneumonia, reaction to medication, pulmonary aspiration, perforation of a viscus, bleeding, failure to diagnose a condition and creating a complication requiring transfusion or operation were discussed with the patient who freely signed the consent.    Description of Procedure: The patient was seen in the Preoperative Area, was examined and was deemed appropriate to proceed.  The patient was taken to [facility] endoscopy room 3, identified as [name] and the procedure verified as Flexible Video Fiberoptic Bronchoscopy.  A Time Out was held and the above information confirmed.   Prior to the date of the procedure a high-resolution CT scan of the chest was performed. Utilizing ION software program a virtual tracheobronchial tree was generated to allow the creation of distinct navigation pathways to the patient's parenchymal abnormalities. After being taken to the operating room general anesthesia was initiated and the patient  was orally intubated. The video fiberoptic bronchoscope was introduced via the endotracheal tube and a general inspection was performed which showed normal right and left lung anatomy, aspiration of the bilateral mainstems was completed to remove any remaining secretions. Robotic catheter inserted into patient's endotracheal tube.   Target #1 LUL nodule: The distinct navigation  pathways prepared prior to this procedure were then utilized to navigate to patient's lesion identified on CT scan. The robotic catheter was secured into place and the vision probe was withdrawn.  Lesion location was approximated using fluoroscopy, 3D cone beam CT (with tool in lesion confirmation) and radial endobronchial ultrasound for peripheral targeting. Under fluoroscopic guidance transbronchial brushings, transbronchial needle biopsies, and transbronchial forceps biopsies were performed to be sent for cytology and culture. A bronchioalveolar lavage was performed in the LUL and sent for cytology.  EBUS:  We then introduced the EBUS bronchoscope and evaluated the hilar and mediastinal lymph nodes. Stations 7 and 11L inferior were noted to be enlarged and were biopsied with a 21G Olympus ViziShot 1 needle. 4 passes obtained at station 7 and 3 passes obtained at station 11L.  At the end of the procedure a general airway inspection was performed and there was no evidence of active bleeding. The bronchoscope was removed.  The patient tolerated the procedure well. There was no significant blood loss and there were no obvious complications. A post-procedural chest x-ray is pending.  Samples Target #1: LUL nodule 1. Transbronchial brushings from LUL 2. Transbronchial Wang needle biopsies from LUL 3. Transbronchial forceps biopsies from LUL 4. Bronchoalveolar lavage from LUL  EBUS at station 7 and 11L  Plans:  The patient will be discharged from the PACU to home when recovered from anesthesia and after chest x-ray is reviewed. We will review the cytology, pathology and microbiology results with the patient when they become available. Outpatient followup will be with me.  Raechel Chute, MD [facility] 12/25/2023 11:16 AM

## 2023-12-25 NOTE — Transfer of Care (Signed)
Immediate Anesthesia Transfer of Care Note  Patient: [name]  Procedure(s) Performed: ROBOTIC ASSISTED NAVIGATIONAL BRONCHOSCOPY (Bilateral) VIDEO BRONCHOSCOPY WITH RADIAL ENDOBRONCHIAL ULTRASOUND BRONCHIAL NEEDLE ASPIRATION BIOPSIES BRONCHIAL BRUSHINGS BRONCHIAL BIOPSIES VIDEO BRONCHOSCOPY WITH ENDOBRONCHIAL ULTRASOUND BRONCHIAL WASHINGS  Patient Location: PACU  Anesthesia Type:General  Level of Consciousness: awake and sedated  Airway & Oxygen Therapy: Patient Spontanous Breathing and Patient connected to face mask oxygen  Post-op Assessment: Report given to RN and Post -op Vital signs reviewed and stable  Post vital signs: Reviewed and stable  Last Vitals:  Vitals Value Taken Time  BP    Temp    Pulse 62 12/25/23 1117  Resp 21 12/25/23 1117  SpO2 97 % 12/25/23 1117  Vitals shown include unfiled device data.  Last Pain:  Vitals:   12/25/23 0733  TempSrc:   PainSc: 5       Patients Stated Pain Goal: 0 (12/25/23 0733)  Complications: No notable events documented.

## 2023-12-26 ENCOUNTER — Encounter (HOSPITAL_COMMUNITY): Payer: Self-pay | Admitting: Student in an Organized Health Care Education/Training Program

## 2023-12-26 LAB — ACID FAST SMEAR (AFB, MYCOBACTERIA): Acid Fast Smear: NEGATIVE

## 2023-12-26 LAB — CYTOLOGY - NON PAP

## 2023-12-29 ENCOUNTER — Other Ambulatory Visit (HOSPITAL_COMMUNITY): Payer: Self-pay | Admitting: Student in an Organized Health Care Education/Training Program

## 2023-12-29 DIAGNOSIS — C3492 Malignant neoplasm of unspecified part of left bronchus or lung: Secondary | ICD-10-CM

## 2023-12-30 LAB — CYTOLOGY - NON PAP

## 2023-12-30 LAB — AEROBIC/ANAEROBIC CULTURE W GRAM STAIN (SURGICAL/DEEP WOUND): Culture: NO GROWTH

## 2024-01-01 ENCOUNTER — Other Ambulatory Visit: Payer: Self-pay

## 2024-01-01 ENCOUNTER — Ambulatory Visit: Payer: Medicare HMO | Attending: Student in an Organized Health Care Education/Training Program

## 2024-01-01 DIAGNOSIS — F17210 Nicotine dependence, cigarettes, uncomplicated: Secondary | ICD-10-CM | POA: Insufficient documentation

## 2024-01-01 DIAGNOSIS — R911 Solitary pulmonary nodule: Secondary | ICD-10-CM | POA: Diagnosis not present

## 2024-01-01 LAB — PULMONARY FUNCTION TEST ARMC ONLY
DL/VA % pred: 37 %
DL/VA: 1.54 ml/min/mmHg/L
DLCO unc % pred: 35 %
DLCO unc: 9.6 ml/min/mmHg
FEF 25-75 Post: 1.31 L/s
FEF 25-75 Pre: 1.28 L/s
FEF2575-%Change-Post: 1 %
FEF2575-%Pred-Post: 47 %
FEF2575-%Pred-Pre: 46 %
FEV1-%Change-Post: -7 %
FEV1-%Pred-Post: 68 %
FEV1-%Pred-Pre: 73 %
FEV1-Post: 2.4 L
FEV1-Pre: 2.59 L
FEV1FVC-%Change-Post: -8 %
FEV1FVC-%Pred-Pre: 85 %
FEV6-%Change-Post: 0 %
FEV6-%Pred-Post: 88 %
FEV6-%Pred-Pre: 88 %
FEV6-Post: 4 L
FEV6-Pre: 3.98 L
FEV6FVC-%Change-Post: 0 %
FEV6FVC-%Pred-Post: 101 %
FEV6FVC-%Pred-Pre: 102 %
FVC-%Change-Post: 1 %
FVC-%Pred-Post: 87 %
FVC-%Pred-Pre: 86 %
FVC-Post: 4.14 L
FVC-Pre: 4.09 L
Post FEV1/FVC ratio: 58 %
Post FEV6/FVC ratio: 97 %
Pre FEV1/FVC ratio: 63 %
Pre FEV6/FVC Ratio: 97 %
RV % pred: 80 %
RV: 1.96 L
TLC % pred: 92 %
TLC: 6.64 L

## 2024-01-01 MED ORDER — ALBUTEROL SULFATE (2.5 MG/3ML) 0.083% IN NEBU
2.5000 mg | INHALATION_SOLUTION | Freq: Once | RESPIRATORY_TRACT | Status: AC
Start: 2024-01-01 — End: 2024-01-01
  Administered 2024-01-01: 2.5 mg via RESPIRATORY_TRACT
  Filled 2024-01-01: qty 3

## 2024-01-02 NOTE — Progress Notes (Signed)
 The proposed treatment discussed in conference is for discussion purpose only and is not a binding recommendation.  The patients have not been physically examined, or presented with their treatment options.  Therefore, final treatment plans cannot be decided.

## 2024-01-06 ENCOUNTER — Telehealth: Payer: Self-pay | Admitting: Oncology

## 2024-01-06 ENCOUNTER — Inpatient Hospital Stay: Payer: Medicare HMO

## 2024-01-06 ENCOUNTER — Encounter: Payer: Self-pay | Admitting: *Deleted

## 2024-01-06 ENCOUNTER — Inpatient Hospital Stay: Payer: Medicare HMO | Attending: Oncology | Admitting: Oncology

## 2024-01-06 ENCOUNTER — Encounter: Payer: Self-pay | Admitting: Oncology

## 2024-01-06 VITALS — BP 148/73 | HR 59 | Temp 96.8°F | Resp 18 | Wt 175.9 lb

## 2024-01-06 DIAGNOSIS — K7469 Other cirrhosis of liver: Secondary | ICD-10-CM | POA: Diagnosis not present

## 2024-01-06 DIAGNOSIS — M25511 Pain in right shoulder: Secondary | ICD-10-CM | POA: Insufficient documentation

## 2024-01-06 DIAGNOSIS — G8929 Other chronic pain: Secondary | ICD-10-CM

## 2024-01-06 DIAGNOSIS — C7A1 Malignant poorly differentiated neuroendocrine tumors: Secondary | ICD-10-CM | POA: Diagnosis not present

## 2024-01-06 DIAGNOSIS — F17210 Nicotine dependence, cigarettes, uncomplicated: Secondary | ICD-10-CM | POA: Diagnosis not present

## 2024-01-06 DIAGNOSIS — F17200 Nicotine dependence, unspecified, uncomplicated: Secondary | ICD-10-CM

## 2024-01-06 NOTE — Assessment & Plan Note (Signed)
Recommend smoke cessation.

## 2024-01-06 NOTE — Assessment & Plan Note (Signed)
Compensated.  Recommend patient continue follow-up with GI

## 2024-01-06 NOTE — Assessment & Plan Note (Signed)
Imaging results and bronchoscopy biopsy pathology results were reviewed with patient. 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 Recommend MRI brain with and without contrast to complete staging. Recommend patient to establish care with thoracic surgery for evaluation of resection. I plan to see patient 2 weeks after surgery to go over final pathological results and staging and adjuvant plan.

## 2024-01-06 NOTE — Progress Notes (Signed)
Met with patient during initial consult with Dr. Cathie Hoops. All questions answered during visit. Reviewed upcoming appts. Resources regarding diagnosis and supportive services given to patient. Contact info given and instructed to call with any questions or needs. Pt verbalized understanding.

## 2024-01-06 NOTE — Assessment & Plan Note (Addendum)
There is intense activity on PET scan, concerning for infection or inflammation recommend patient to contact orthopedic surgeon for further evaluation.

## 2024-01-06 NOTE — Progress Notes (Signed)
Hematology/Oncology Consult Note [phone] [phone]     REFERRING PROVIDER: Raechel Chute, MD    CHIEF COMPLAINTS/PURPOSE OF CONSULTATION:  Stage I high-grade neuroendocrine carcinoma  ASSESSMENT & PLAN:   High grade neuroendocrine carcinoma of lung [facility]) Imaging results and bronchoscopy biopsy pathology results were reviewed with patient. 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 Recommend MRI brain with and without contrast to complete staging. Recommend patient to establish care with thoracic surgery for evaluation of resection. I plan to see patient 2 weeks after surgery to go over final pathological results and staging and adjuvant plan.  Right shoulder pain There is intense activity on PET scan, concerning for infection or inflammation recommend patient to contact orthopedic surgeon for further evaluation.  NICOTINE ADDICTION Recommend smoke cessation.  Other cirrhosis of liver (HCC) Compensated.  Recommend patient continue follow-up with GI   Orders Placed This Encounter  Procedures   MR Brain W Wo Contrast    Standing Status:   Future    Expected Date:   01/13/2024    Expiration Date:   01/05/2025    If indicated for the ordered procedure, I authorize the administration of contrast media per Radiology protocol:   Yes    What is the patient's sedation requirement?:   No Sedation    Does the patient have a pacemaker or implanted devices?:   No    Use SRS Protocol?:   Yes    Preferred imaging location?:   [facility] (table limit - 550lbs)    All questions were answered. The patient knows to call the clinic with any problems, questions or concerns.  Rickard Patience, MD, PhD [facility] Hematology Oncology 01/06/2024    HISTORY OF PRESENTING ILLNESS:  [name] 68 y.o. 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 I have reviewed his chart and  materials related to his cancer extensively and collaborated history with the patient. 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 Enlarging microlobulated lingular nodule, now 15.9 mm in size. Lung-RADS 4B, suspicious. Additional imaging evaluation or consultation with Pulmonology or Thoracic Surgery recommended. These results will be called to the ordering clinician or representative by the Radiologist Assistant, and communication documented in the PACS or [employer]. 2. New 4.4 cm lingular nodule.  Recommend attention on follow-up. 3. Cirrhosis. 4.  Aortic atherosclerosis (ICD10-I70.0). 5.  Emphysema    01/06/2024 Initial Diagnosis   High grade neuroendocrine carcinoma of lung   Patient was evaluated by pulmonology and underwent biopsy via bronchoscopy. A.  LEFT LUNG, UPPER LOBE, NODULE, FINE NEEDLE ASPIRATION  BIOPSY:  - Malignant  - Non-small cell carcinoma (see comment)   B.  LEFT LUNG, UPPER LOBE, NODULE, BRUSHING:  - Malignant  - Non-small cell carcinoma (see comment)   ADDENDUM 1 :  Immunohistochemistry is performed to classify the carcinoma.  The tumor cells are negative with TTF-1 and p40.  Additional  immunohistochemistry will be performed and reported in a subsequent  addendum.   ADDENDUM 2: Additional immunohistochemistry is performed to better  characterize the carcinoma and the malignant cells are positive with  synaptophysin and negative with cytokeratin 5/6, CD56 and chromogranin.  Ki-67 shows a moderately increased proliferation rate.  The presence of  synaptophysin positivity is most consistent with intermediate to  high-grade neuroendocrine carcinoma    D. LYMPH  NODE, STATION 7, FINE NEEDLE ASPIRATION:  - Negative for malignancy  - Compatible with a benign lymph node   E. LYMPH NODE, STATION 11L, FINE NEEDLE ASPIRATION:  - Negative for  malignancy  - Compatible with a benign lymph node   F. LUNG, LUL, LAVAGE:   FINAL MICROSCOPIC DIAGNOSIS:  - No malignant cells identified  - Pulmonary macrophages and benign bronchial cells    01/06/2024 Cancer Staging   Staging form: Lung, AJCC V9 - Clinical: Stage IA2 (cT1b, cN0, cM0) - Signed by Rickard Patience, MD on 01/06/2024   10/27/2024 Imaging   PET scan showed 1. Intense metabolic activity associated with a LEFT upper lobe pulmonary nodule consistent primary bronchogenic carcinoma. 2. No evidence of metastatic adenopathy in the mediastinum. No distant metastatic disease. 3. Intense metabolic activity associated with the RIGHT shoulder versus arthroplasty. Recommend correlation for loosening or infection. 4. Cirrhotic morphology of the liver. 5. Post CABG.   Patient was accompanied by daughter today. Denies unintentional weight loss, hemoptysis headache double vision. He has a history of smoking, currently smoking about a pack of cigarettes per day.  Denies any alcohol use. He experiences ongoing pain and swelling in his right shoulder, where he had a reverse shoulder replacement approximately four to five years ago. The PET scan showed activity in this area, raising concerns about possible infection or chronic inflammation. He has limited range of motion and is unable to lift his arm beyond a certain point.    MEDICAL HISTORY:  Past Medical History:  Diagnosis Date   Anemia    Anginal pain (HCC)    Arthritis    Atherosclerotic peripheral vascular disease with intermittent claudication (HCC)    Bronchitis    h/o   CHF (congestive heart failure) (HCC)    Cirrhosis, non-alcoholic (HCC)    Coronary artery disease    Depression    Diabetes mellitus without complication (HCC)    diet controlled   Difficult intubation    Pt Denies   Dyspnea    with exertion   GERD (gastroesophageal reflux disease)    Heart failure with mid-range ejection fraction (HFmEF) (HCC)    History  of MRSA infection 2010   Hx of CABG    Hyperlipidemia    Hypertension    Insomnia    Ischemic cardiomyopathy    Liver cirrhosis (HCC)    Peripheral vascular disease (HCC)    Stomach ulcer    Stomach ulcer    Wears hearing aid in both ears     SURGICAL HISTORY: Past Surgical History:  Procedure Laterality Date   ANTERIOR CERVICAL DECOMP/DISCECTOMY FUSION N/A 01/28/2018   Procedure: ANTERIOR CERVICAL DECOMPRESSION/DISCECTOMY FUSION 2 LEVELS C3-5;  Surgeon: [name], MD;  Location: [facility] ORS;  Service: Neurosurgery;  Laterality: N/A;   BRONCHIAL BIOPSY  12/25/2023   Procedure: BRONCHIAL BIOPSIES;  Surgeon: Raechel Chute, MD;  Location: [facility] END[facility]COPY;  Service: Pulmonary;;   BRONCHIAL BRUSHINGS  12/25/2023   Procedure: BRONCHIAL BRUSHINGS;  Surgeon: Raechel Chute, MD;  Location: [facility] ENDOSCOPY;  Service: Pulmonary;;   BRONCHIAL NEEDLE ASPIRATION BIOPSY  12/25/2023   Procedure: BRONCHIAL NEEDLE ASPIRATION BIOPSIES;  Surgeon: Raechel Chute, MD;  Location: [facility] ENDOSCOPY;  Service: Pulmonary;;   BRONCHIAL WASHINGS  12/25/2023   Procedure: BRONCHIAL WASHINGS;  Surgeon: Raechel Chute, MD;  Location: [facility] ENDOSCOPY;  Service: Pulmonary;;   COLON SURGERY  2004   due to diverticulitis.  Partail colectomy with Anastamosis   COLONOSCOPY N/A 03/27/2023   Procedure: COLONOSCOPY;  Surgeon: Lannette Donath  Betti Cruz, MD;  Location: [facility] ENDOSCOPY;  Service: Gastroenterology;  Laterality: N/A;   COLONOSCOPY WITH PROPOFOL N/A 03/26/2023   Procedure: COLONOSCOPY WITH PROPOFOL;  Surgeon: [name], MD;  Location: [facility] ENDOSCOPY;  Service: Gastroenterology;  Laterality: N/A;   CORONARY ARTERY BYPASS GRAFT     ESOPHAGOGASTRODUODENOSCOPY (EGD) WITH PROPOFOL N/A 03/26/2023   Procedure: ESOPHAGOGASTRODUODENOSCOPY (EGD) WITH PROPOFOL;  Surgeon: [name], MD;  Location: [facility] ENDOSCOPY;  Service: Gastroenterology;  Laterality: N/A;   FINE NEEDLE ASPIRATION  12/25/2023   Procedure: FINE NEEDLE  ASPIRATION (FNA) LINEAR;  Surgeon: Raechel Chute, MD;  Location: [facility] ENDOSCOPY;  Service: Pulmonary;;   HAND TENDON SURGERY Right    right thumb   LUMBAR LAMINECTOMY/DECOMPRESSION MICRODISCECTOMY N/A 09/01/2019   Procedure: LUMBAR LAMINECTOMY/DECOMPRESSION MICRODISCECTOMY 3 LEVELS L2-L5;  Surgeon: [name], MD;  Location: [facility] ORS;  Service: Neurosurgery;  Laterality: N/A;   neck fusion     SHOULDER ARTHROSCOPY WITH OPEN ROTATOR CUFF REPAIR Right 04/08/2016   Procedure: SHOULDER ARTHROSCOPY WITH OPEN ROTATOR CUFF REPAIR;  Surgeon: [name], MD;  Location: [facility] ORS;  Service: Orthopedics;  Laterality: Right;   TONSILLECTOMY     TRANSFORAMINAL LUMBAR INTERBODY FUSION W/ MIS 1 LEVEL N/A 09/01/2019   Procedure: MINIMALLY INVASIVE (MIS) TRANSFORAMINAL LUMBAR INTERBODY FUSION (TLIF) 1 LEVEL L5-S1;  Surgeon: [name], MD;  Location: [facility] ORS;  Service: Neurosurgery;  Laterality: N/A;   TYMPANOPLASTY Right 08/19/2023   Procedure: TYMPANOPLASTY WITH REMOVAL OF CHOLESTEATOMA;  Surgeon: Geanie Logan, MD;  Location: [facility];  Service: ENT;  Laterality: Right;   VIDEO BRONCHOSCOPY WITH ENDOBRONCHIAL ULTRASOUND  12/25/2023   Procedure: VIDEO BRONCHOSCOPY WITH ENDOBRONCHIAL ULTRASOUND;  Surgeon: Raechel Chute, MD;  Location: [facility] ENDOSCOPY;  Service: Pulmonary;;   VIDEO BRONCHOSCOPY WITH RADIAL ENDOBRONCHIAL ULTRASOUND  12/25/2023   Procedure: VIDEO BRONCHOSCOPY WITH RADIAL ENDOBRONCHIAL ULTRASOUND;  Surgeon: Raechel Chute, MD;  Location: [facility] ENDOSCOPY;  Service: Pulmonary;;    SOCIAL HISTORY: Social History   Socioeconomic History   Marital status: Married    Spouse name: Not on file   Number of children: Not on file   Years of education: Not on file   Highest education level: Not on file  Occupational History   Not on file  Tobacco Use   Smoking status: Every Day    Current packs/day: 1.00    Average packs/day: 1 pack/day for 50.2 years (50.2 ttl pk-yrs)     Types: Cigarettes    Start date: 02/08/1968    Last attempt to quit: 02/07/2018   Smokeless tobacco: Never  Vaping Use   Vaping status: Former  Substance and Sexual Activity   Alcohol use: Not Currently    Comment: rarely   Drug use: No   Sexual activity: Yes  Other Topics Concern   Not on file  Social History Narrative   Not on file   Social Drivers of Health   Financial Resource Strain: Not on file  Food Insecurity: No Food Insecurity (01/06/2024)   Hunger Vital Sign    Worried About Running Out of Food in the Last Year: Never true    Ran Out of Food in the Last Year: Never true  Transportation Needs: No Transportation Needs (01/06/2024)   PRAPARE - Administrator, Civil Service (Medical): No    Lack of Transportation (Non-Medical): No  Physical Activity: Not on file  Stress: No Stress Concern Present (01/06/2024)   [employer] of Occupational Health - Occupational Stress Questionnaire    Feeling of Stress :  Not at all  Social Connections: Not on file  Intimate Partner Violence: Not At Risk (01/06/2024)   Humiliation, Afraid, Rape, and Kick questionnaire    Fear of Current or Ex-Partner: No    Emotionally Abused: No    Physically Abused: No    Sexually Abused: No    FAMILY HISTORY: History reviewed. No pertinent family history.  ALLERGIES:  is allergic to penicillins and atorvastatin.  MEDICATIONS:  Current Outpatient Medications  Medication Sig Dispense Refill   albuterol (VENTOLIN HFA) 108 (90 Base) MCG/ACT inhaler Inhale 1-2 puffs into the lungs every 6 (six) hours as needed for wheezing or shortness of breath.     amitriptyline (ELAVIL) 50 MG tablet Take 50 mg by mouth at bedtime.     aspirin 325 MG tablet Take 325 mg by mouth daily.     ciprofloxacin-dexamethasone (CIPRODEX) OTIC suspension Place 4 drops into both ears 2 (two) times daily for 5 days. DOS 08/19/23. 7.5 mL 0   citalopram (CELEXA) 40 MG tablet Take 40 mg by mouth every morning.       DULoxetine (CYMBALTA) 60 MG capsule Take 60 mg by mouth 2 (two) times daily.     fluticasone (FLONASE) 50 MCG/ACT nasal spray Place 2 sprays into both nostrils daily.     isosorbide mononitrate (IMDUR) 30 MG 24 hr tablet Take 30 mg by mouth daily.     lansoprazole (PREVACID) 15 MG capsule Take 15 mg by mouth daily at 12 noon.     meloxicam (MOBIC) 15 MG tablet Take 15 mg by mouth every morning.     nitroGLYCERIN (NITROSTAT) 0.4 MG SL tablet Place 0.4 mg under the tongue every 5 (five) minutes as needed for chest pain.     ofloxacin (OCUFLOX) 0.3 % ophthalmic solution Place 4 drops into the left ear 2 (two) times daily. 5 mL 5   omeprazole (PRILOSEC) 20 MG capsule Take 20 mg by mouth daily.     pregabalin (LYRICA) 300 MG capsule Take 300 mg by mouth 2 (two) times daily.     rosuvastatin (CRESTOR) 20 MG tablet Take 20 mg by mouth daily.     spironolactone (ALDACTONE) 25 MG tablet Take 12.5 mg by mouth daily.     tamsulosin (FLOMAX) 0.4 MG CAPS capsule Take 0.4 mg by mouth daily after supper.     valsartan (DIOVAN) 320 MG tablet Take 320 mg by mouth daily.     No current facility-administered medications for this visit.    Review of Systems  Constitutional:  Negative for appetite change, chills, fatigue, fever and unexpected weight change.  HENT:   Negative for hearing loss and voice change.   Eyes:  Negative for eye problems and icterus.  Respiratory:  Negative for chest tightness, cough and shortness of breath.   Cardiovascular:  Negative for chest pain and leg swelling.  Gastrointestinal:  Negative for abdominal distention and abdominal pain.  Endocrine: Negative for hot flashes.  Genitourinary:  Negative for difficulty urinating, dysuria and frequency.   Musculoskeletal:  Positive for arthralgias.       Right shoulder pain with limited motion  Skin:  Negative for itching and rash.  Neurological:  Negative for light-headedness and numbness.  Hematological:  Negative for adenopathy. Does  not bruise/bleed easily.  Psychiatric/Behavioral:  Negative for confusion.      PHYSICAL EXAMINATION: ECOG PERFORMANCE STATUS: 0 - Asymptomatic  Vitals:   01/06/24 1113  BP: (!) 148/73  Pulse: (!) 59  Resp: 18  Temp: (!) 96.8  F (36 C)  SpO2: 99%   Filed Weights   01/06/24 1113  Weight: 175 lb 14.4 oz (79.8 kg)    Physical Exam Constitutional:      General: He is not in acute distress.    Appearance: He is not diaphoretic.  HENT:     Head: Normocephalic and atraumatic.     Nose: Nose normal.  Eyes:     General: No scleral icterus. Cardiovascular:     Rate and Rhythm: Normal rate and regular rhythm.     Heart sounds: No murmur heard. Pulmonary:     Effort: Pulmonary effort is normal. No respiratory distress.     Breath sounds: No wheezing.  Abdominal:     General: Bowel sounds are normal. There is no distension.     Palpations: Abdomen is soft.     Tenderness: There is no abdominal tenderness.  Musculoskeletal:     Cervical back: Normal range of motion and neck supple.     Comments: Right shoulder limited range of motion.  Skin:    General: Skin is warm and dry.     Findings: No erythema.  Neurological:     Mental Status: He is alert and oriented to person, place, and time. Mental status is at baseline.     Motor: No abnormal muscle tone.  Psychiatric:        Mood and Affect: Mood and affect normal.      LABORATORY DATA:  I have reviewed the data as listed    Latest Ref Rng & Units 12/25/2023    7:44 AM 03/12/2023    1:32 PM 08/11/2019    9:59 AM  CBC  WBC 4.0 - 10.5 K/uL 9.7  8.2  10.6   Hemoglobin 13.0 - 17.0 g/dL 40.9  81.1  91.4   Hematocrit 39.0 - 52.0 % 43.3  45.5  44.9   Platelets 150 - 400 K/uL 194  171  200       Latest Ref Rng & Units 12/25/2023    7:44 AM 03/12/2023    1:32 PM 08/11/2019    9:59 AM  CMP  Glucose 70 - 99 mg/dL 782  956  213   BUN 8 - 23 mg/dL 12  14  11    Creatinine 0.61 - 1.24 mg/dL 0.86  5.78  4.69   Sodium 135 - 145  mmol/L 136  143  138   Potassium 3.5 - 5.1 mmol/L 3.8  3.7  3.6   Chloride 98 - 111 mmol/L 102  102  102   CO2 22 - 32 mmol/L 23  25  26    Calcium 8.9 - 10.3 mg/dL 8.6  9.0  9.0   Total Protein 6.5 - 8.1 g/dL 7.0  7.0    Total Bilirubin 0.0 - 1.2 mg/dL 0.7  0.5    Alkaline Phos 38 - 126 U/L 120  142    AST 15 - 41 U/L 36  22    ALT 0 - 44 U/L 32  24       RADIOGRAPHIC STUDIES: I have personally reviewed the radiological images as listed and agreed with the findings in the report. CT SUPER D CHEST WO CONTRAST Result Date: 12/29/2023 CLINICAL DATA:  Left upper lobe pulmonary nodule. Pre EMB and biopsy. EXAM: CT CHEST WITHOUT CONTRAST TECHNIQUE: Multidetector CT imaging of the chest was performed using thin slice collimation for electromagnetic bronchoscopy planning purposes, without intravenous contrast. RADIATION DOSE REDUCTION: This exam was performed according to the departmental  dose-optimization program which includes automated exposure control, adjustment of the mA and/or kV according to patient size and/or use of iterative reconstruction technique. COMPARISON:  PET-CT 12/24/2023 FINDINGS: Cardiovascular: The heart is normal in size. No pericardial effusion. The aorta is normal in caliber. Scattered atherosclerotic calcifications. Three-vessel coronary artery calcifications and evidence of prior coronary artery bypass surgery. Mediastinum/Nodes: Scattered mediastinal and hilar lymph nodes are stable. No overt adenopathy. The esophagus is grossly normal. Lungs/Pleura: Stable 16 mm left upper lobe pulmonary nodule. This was hypermetabolic on the PET-CT and is consistent with a small primary lung neoplasm. No metastatic pulmonary nodules are identified. Stable significant underlying lung disease with emphysema and pulmonary scarring. No pleural effusions or pleural lesions. Upper Abdomen: Stable cirrhotic changes involving the liver. No obvious hepatic lesions. Evidence of portal venous hypertension  and portal venous collaterals. Upper abdominal lymph nodes typical with cirrhosis. No adrenal gland lesions are identified. Musculoskeletal: No significant bony findings. Age advanced degenerative changes involving the thoracic spine and cervical spine fusion hardware noted. IMPRESSION: 1. Stable 16 mm left upper lobe pulmonary nodule. This was hypermetabolic on the PET-CT and is consistent with a small primary lung neoplasm. 2. No metastatic pulmonary nodules are identified. 3. Stable significant underlying lung disease with emphysema and pulmonary scarring. 4. Stable cirrhotic changes involving the liver. Evidence of portal venous hypertension and portal venous collaterals. 5. Age advanced degenerative changes involving the thoracic spine and cervical spine fusion hardware noted. Electronically Signed   By: Rudie Meyer M.D.   On: 12/29/2023 21:25   NM PET Image Initial (PI) Skull Base To Thigh (F-18 FDG) Result Date: 12/29/2023 CLINICAL DATA:  Subsequent treatment strategy for pulmonary nodule. EXAM: NUCLEAR MEDICINE PET SKULL BASE TO THIGH TECHNIQUE: 10.2 mCi F-18 FDG was injected intravenously. Full-ring PET imaging was performed from the skull base to thigh after the radiotracer. CT data was obtained and used for attenuation correction and anatomic localization. Fasting blood glucose: 179 mg/dl COMPARISON:  Chest CT 96/02/5408 FINDINGS: NECK: No hypermetabolic lymph nodes in the neck. Incidental CT findings: None. CHEST: Lobular LEFT upper lobe pulmonary nodule measures 17 mm (image 52) with SUV max equal 10.5. No additional hypermetabolic or enlarged pulmonary nodules. No hypermetabolic mediastinal lymph nodes. Incidental CT findings: Post CABG ABDOMEN/PELVIS: No abnormal hypermetabolic activity within the liver, pancreas, adrenal glands, or spleen. No hypermetabolic lymph nodes in the abdomen or pelvis. Incidental CT findings: Liver has a nodular contour caudate lobe enlarged. SKELETON: Intense activity  associated with the RIGHT shoulder reverse arthroplasty. Intense mint metabolic activity involving the joint space and extending along the stem of the prosthetic Posterior lumbar fusion. Incidental CT findings: None. IMPRESSION: 1. Intense metabolic activity associated with a LEFT upper lobe pulmonary nodule consistent primary bronchogenic carcinoma. 2. No evidence of metastatic adenopathy in the mediastinum. No distant metastatic disease. 3. Intense metabolic activity associated with the RIGHT shoulder versus arthroplasty. Recommend correlation for loosening or infection. 4. Cirrhotic morphology of the liver. 5. Post CABG. Electronically Signed   By: Genevive Bi M.D.   On: 12/29/2023 16:34   DG Chest Port 1 View Result Date: 12/25/2023 CLINICAL DATA:  Post bronchoscopy. EXAM: PORTABLE CHEST 1 VIEW COMPARISON:  CT 12/24/2023 FINDINGS: Subtle haziness surrounding the LEFT upper lobe nodule. No pneumothorax. No pleural fluid. Midline sternotomy. Anterior cervical fusion. IMPRESSION: 1. No pneumothorax following bronchoscopy. 2. Mild haziness around the LEFT upper lobe nodule. Electronically Signed   By: Genevive Bi M.D.   On: 12/25/2023 12:01   DG  C-ARM BRONCHOSCOPY Result Date: 12/25/2023 C-ARM BRONCHOSCOPY: Fluoroscopy was utilized by the requesting physician.  No radiographic interpretation.

## 2024-01-06 NOTE — Telephone Encounter (Signed)
Received insurance authorization for brain MRI. I was able to r/s the appt to be sooner (01/08/24). I left a vm for pt about the new date and time as pt wanted to get this done asap.

## 2024-01-07 ENCOUNTER — Encounter: Payer: Self-pay | Admitting: Student in an Organized Health Care Education/Training Program

## 2024-01-07 ENCOUNTER — Ambulatory Visit: Payer: Medicare HMO | Admitting: Student in an Organized Health Care Education/Training Program

## 2024-01-07 VITALS — BP 132/82 | HR 64 | Temp 97.1°F | Ht 71.0 in | Wt 178.8 lb

## 2024-01-07 DIAGNOSIS — F17200 Nicotine dependence, unspecified, uncomplicated: Secondary | ICD-10-CM | POA: Diagnosis not present

## 2024-01-07 DIAGNOSIS — C7A1 Malignant poorly differentiated neuroendocrine tumors: Secondary | ICD-10-CM | POA: Diagnosis not present

## 2024-01-07 DIAGNOSIS — J4489 Other specified chronic obstructive pulmonary disease: Secondary | ICD-10-CM | POA: Diagnosis not present

## 2024-01-07 DIAGNOSIS — J439 Emphysema, unspecified: Secondary | ICD-10-CM

## 2024-01-07 MED ORDER — ANORO ELLIPTA 62.5-25 MCG/ACT IN AEPB: 1.0000 | INHALATION_SPRAY | Freq: Every day | RESPIRATORY_TRACT | 11 refills | Status: DC

## 2024-01-07 NOTE — Progress Notes (Signed)
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  This was biopsied with robotic assisted navigational bronchoscopy with finding of high-grade neuroendocrine carcinoma of the lung.  EBUS to the mediastinal lymph node stations was negative.  Patient has been referred to thoracic surgery for consideration of resection.  He is also referred to oncology and MRI of the brain is currently pending.  -referred to oncology -MRI brain -referred to thoracic surgery  #COPD  He has a longstanding history of smoking and reports symptoms of cough, wheeze, and sputum production.  Pulmonary function testing showed FEV1/FVC of 0.63 with FEV1 at 73% of predicted.  This is overall consistent with COPD for which we will prescribe long-acting inhalers.  Will initiate LABA/LAMA therapy with Anoro Ellipta.  -Start Anoro ellipta one puff once daily  #Tobacco Use disorder  Counseled patient on the importance of smoking cessation given his diagnosis of lung malignancy, COPD, and potential upcoming pulmonary resection.  Discussed different strategies.  Return in about 6 months (around 07/06/2024).  I spent 34 minutes caring for this patient today, including preparing to see the patient, obtaining a medical history , reviewing a separately obtained history, performing a medically appropriate examination and/or evaluation, counseling and educating the patient/family/caregiver, ordering medications, tests, or procedures, documenting clinical information in the electronic health record, and independently interpreting results (not separately reported/billed) and communicating results to the patient/family/caregiver.  3 minutes utilized for counseling patient regarding smoking cessation.  Raechel Chute, MD [facility]  End of visit medications:  No orders of the defined types were  placed in this encounter.    Current Outpatient Medications:    albuterol (VENTOLIN HFA) 108 (90 Base) MCG/ACT inhaler, Inhale 1-2 puffs into the lungs every 6 (six) hours as needed for wheezing or shortness of breath., Disp: , Rfl:    amitriptyline (ELAVIL) 50 MG tablet, Take 50 mg by mouth at bedtime., Disp: , Rfl:    aspirin 325 MG tablet, Take 325 mg by mouth daily., Disp: , Rfl:    ciprofloxacin-dexamethasone (CIPRODEX) OTIC suspension, Place 4 drops into both ears 2 (two) times daily for 5 days. DOS 08/19/23., Disp: 7.5 mL, Rfl: 0   citalopram (CELEXA) 40 MG tablet, Take 40 mg by mouth every morning. , Disp: , Rfl:    DULoxetine (CYMBALTA) 60 MG capsule, Take 60 mg by mouth 2 (two) times daily., Disp: , Rfl:    fluticasone (FLONASE) 50 MCG/ACT nasal spray, Place 2 sprays into both nostrils daily., Disp: , Rfl:    isosorbide mononitrate (IMDUR) 30 MG 24 hr tablet, Take 30 mg by mouth daily., Disp: , Rfl:    lansoprazole (PREVACID) 15 MG capsule, Take 15 mg by mouth daily at 12 noon., Disp: , Rfl:    meloxicam (MOBIC) 15 MG tablet, Take 15 mg by mouth every morning., Disp: , Rfl:    nitroGLYCERIN (NITROSTAT) 0.4 MG SL tablet, Place 0.4 mg under the tongue every 5 (five) minutes as needed for chest pain., Disp: , Rfl:    ofloxacin (OCUFLOX) 0.3 % ophthalmic solution, Place 4 drops into the left ear 2 (two) times daily., Disp: 5 mL, Rfl: 5   omeprazole (PRILOSEC) 20 MG capsule, Take 20 mg by mouth daily., Disp: , Rfl:    pregabalin (LYRICA) 300 MG capsule, Take 300 mg by mouth 2 (two) times daily., Disp: , Rfl:  rosuvastatin (CRESTOR) 20 MG tablet, Take 20 mg by mouth daily., Disp: , Rfl:    spironolactone (ALDACTONE) 25 MG tablet, Take 12.5 mg by mouth daily., Disp: , Rfl:    tamsulosin (FLOMAX) 0.4 MG CAPS capsule, Take 0.4 mg by mouth daily after supper., Disp: , Rfl:    valsartan (DIOVAN) 320 MG tablet, Take 320 mg by mouth daily., Disp: , Rfl:    Subjective:   PATIENT ID: [name] GENDER: male DOB: 07/30/1956, MRN: [id number]  Chief Complaint  Patient presents with   Follow-up    Nodule. Bronch was on 1/30.    HPI  The patient is a pleasant 68 year old male with a past medical history of coronary artery disease as well as smoking who presents to clinic for follow up after robotic assisted navigational bronchoscopy.   Patient was enrolled in low-dose CT for lung cancer screening by his primary care physician.  He had his initial CT scan in March 2024 that showed multiple subcentimeter pulmonary nodules with plans for repeat CT at 18-month mark.  Repeat CT was performed on 11/25/2023 showing enlargement of the left upper lobe pulmonary nodule, measuring 4.4 cm. He underwent robotic assisted navigational bronchoscopy on 12/25/2023 with pathology showing high grade neuro-endocrine carcinoma of the lung. EBUS to stations 7 and 11L were negative for malignancy. PET/CT only showed the LUL nodule to be metabolically active (right shoulder does have metabolic activity though suspect this is no related to malignancy but arthroplasty). He has MRI of the brain scheduled for tomorrow and visit with thoracic surgery on 01/09/2024.   Patient reports a chronic cough that has been ongoing for years, sometimes productive of white/yellow sputum.  He has occasional exertional dyspnea.  Over the past few months, he has experienced night sweats as well as chills that have been around 2-3 times a week.  He has not had any weight changes nor has he experienced any rashes.   Past medical history is notable for coronary artery disease, with bypass surgery performed 18 years ago.  He is followed by [facility] cardiology.  He was last seen in October 2024 and prior to that in September 2024 for preoperative risk assessment.  At that time, he was felt to be low risk for procedural intervention.  Patient is also followed by gastroenterology with Dr. Allegra Lai, last seen in April 2024 in clinic and diagnosed with  compensated cirrhosis of the liver which is managed clinically.  He underwent EGD which did not show any varices but was notable for erythematous mucosa in the stomach and noted to have H. pylori which was treated.   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  He has a significant smoking history, having started smoking at the age of 83 with 2 packs/day at his peak.  He did quit for 4 years after his open heart surgery.  He probably has between 55 and 60 pack years of smoking history.  Ancillary information including prior medications, full medical/surgical/family/social histories, and PFTs (when available) are listed below and have been reviewed.   Review of Systems  Constitutional:  Negative for chills, diaphoresis, fever, malaise/fatigue and weight loss.  Respiratory:  Positive for cough and sputum production. Negative for hemoptysis, shortness of breath and wheezing.   Cardiovascular:  Negative for chest pain.     Objective:   Vitals:   01/07/24 1311  BP: 132/82  Pulse:  64  Temp: (!) 97.1 F (36.2 C)  SpO2: 98%  Weight: 178 lb 12.8 oz (81.1 kg)  Height: 5\' 11"  (1.803 m)   98% on RA BMI Readings from Last 3 Encounters:  01/07/24 24.94 kg/m  01/06/24 24.53 kg/m  12/25/23 25.10 kg/m   Wt Readings from Last 3 Encounters:  01/07/24 178 lb 12.8 oz (81.1 kg)  01/06/24 175 lb 14.4 oz (79.8 kg)  12/25/23 180 lb (81.6 kg)    Physical Exam Constitutional:      Appearance: Normal appearance.  Cardiovascular:     Rate and Rhythm: Normal rate and regular rhythm.     Pulses: Normal pulses.     Heart sounds: Normal heart sounds.  Pulmonary:     Effort: Pulmonary effort is normal.     Breath sounds: Normal breath sounds. No wheezing or rales.  Musculoskeletal:     Right lower leg: No edema.     Left lower leg: No edema.  Neurological:     General: No focal deficit present.      Mental Status: He is alert and oriented to person, place, and time. Mental status is at baseline.       Ancillary Information    Past Medical History:  Diagnosis Date   Anemia    Anginal pain (HCC)    Arthritis    Atherosclerotic peripheral vascular disease with intermittent claudication (HCC)    Bronchitis    h/o   CHF (congestive heart failure) (HCC)    Cirrhosis, non-alcoholic (HCC)    Coronary artery disease    Depression    Diabetes mellitus without complication (HCC)    diet controlled   Difficult intubation    Pt Denies   Dyspnea    with exertion   GERD (gastroesophageal reflux disease)    Heart failure with mid-range ejection fraction (HFmEF) (HCC)    History of MRSA infection 2010   Hx of CABG    Hyperlipidemia    Hypertension    Insomnia    Ischemic cardiomyopathy    Liver cirrhosis (HCC)    Peripheral vascular disease (HCC)    Stomach ulcer    Stomach ulcer    Wears hearing aid in both ears      History reviewed. No pertinent family history.   Past Surgical History:  Procedure Laterality Date   ANTERIOR CERVICAL DECOMP/DISCECTOMY FUSION N/A 01/28/2018   Procedure: ANTERIOR CERVICAL DECOMPRESSION/DISCECTOMY FUSION 2 LEVELS C3-5;  Surgeon: [name], MD;  Location: [facility] ORS;  Service: Neurosurgery;  Laterality: N/A;   BRONCHIAL BIOPSY  12/25/2023   Procedure: BRONCHIAL BIOPSIES;  Surgeon: Raechel Chute, MD;  Location: [facility];  Service: Pulmonary;;   BRONCHIAL BRUSHINGS  12/25/2023   Procedure: BRONCHIAL BRUSHINGS;  Surgeon: Raechel Chute, MD;  Location: [facility] ENDOSCOPY;  Service: Pulmonary;;   BRONCHIAL NEEDLE ASPIRATION BIOPSY  12/25/2023   Procedure: BRONCHIAL NEEDLE ASPIRATION BIOPSIES;  Surgeon: Raechel Chute, MD;  Location: [facility];  Service: Pulmonary;;   BRONCHIAL WASHINGS  12/25/2023   Procedure: BRONCHIAL WASHINGS;  Surgeon: Raechel Chute, MD;  Location: [facility];  Service: Pulmonary;;   COLON SURGERY  2004   due to  diverticulitis.  Partail colectomy with Anastamosis   COLONOSCOPY N/A 03/27/2023   Procedure: COLONOSCOPY;  Surgeon: [name], MD;  Location: [facility] ENDOSCOPY;  Service: Gastroenterology;  Laterality: N/A;   COLONOSCOPY WITH PROPOFOL N/A 03/26/2023   Procedure: COLONOSCOPY WITH PROPOFOL;  Surgeon: [name], MD;  Location: [facility] ENDOSCOPY;  Service: Gastroenterology;  Laterality: N/A;  CORONARY ARTERY BYPASS GRAFT     ESOPHAGOGASTRODUODENOSCOPY (EGD) WITH PROPOFOL N/A 03/26/2023   Procedure: ESOPHAGOGASTRODUODENOSCOPY (EGD) WITH PROPOFOL;  Surgeon: [name], MD;  Location: [facility] ENDOSCOPY;  Service: Gastroenterology;  Laterality: N/A;   FINE NEEDLE ASPIRATION  12/25/2023   Procedure: FINE NEEDLE ASPIRATION (FNA) LINEAR;  Surgeon: Raechel Chute, MD;  Location: [facility];  Service: Pulmonary;;   HAND TENDON SURGERY Right    right thumb   LUMBAR LAMINECTOMY/DECOMPRESSION MICRODISCECTOMY N/A 09/01/2019   Procedure: LUMBAR LAMINECTOMY/DECOMPRESSION MICRODISCECTOMY 3 LEVELS L2-L5;  Surgeon: [name], MD;  Location: [facility] ORS;  Service: Neurosurgery;  Laterality: N/A;   neck fusion     SHOULDER ARTHROSCOPY WITH OPEN ROTATOR CUFF REPAIR Right 04/08/2016   Procedure: SHOULDER ARTHROSCOPY WITH OPEN ROTATOR CUFF REPAIR;  Surgeon: [name], MD;  Location: [facility] ORS;  Service: Orthopedics;  Laterality: Right;   TONSILLECTOMY     TRANSFORAMINAL LUMBAR INTERBODY FUSION W/ MIS 1 LEVEL N/A 09/01/2019   Procedure: MINIMALLY INVASIVE (MIS) TRANSFORAMINAL LUMBAR INTERBODY FUSION (TLIF) 1 LEVEL L5-S1;  Surgeon: [name], MD;  Location: [facility] ORS;  Service: Neurosurgery;  Laterality: N/A;   TYMPANOPLASTY Right 08/19/2023   Procedure: TYMPANOPLASTY WITH REMOVAL OF CHOLESTEATOMA;  Surgeon: Geanie Logan, MD;  Location: [facility];  Service: ENT;  Laterality: Right;   VIDEO BRONCHOSCOPY WITH ENDOBRONCHIAL ULTRASOUND  12/25/2023   Procedure: VIDEO  BRONCHOSCOPY WITH ENDOBRONCHIAL ULTRASOUND;  Surgeon: Raechel Chute, MD;  Location: [facility];  Service: Pulmonary;;   VIDEO BRONCHOSCOPY WITH RADIAL ENDOBRONCHIAL ULTRASOUND  12/25/2023   Procedure: VIDEO BRONCHOSCOPY WITH RADIAL ENDOBRONCHIAL ULTRASOUND;  Surgeon: Raechel Chute, MD;  Location: [facility];  Service: Pulmonary;;    Social History   Socioeconomic History   Marital status: Married    Spouse name: Not on file   Number of children: Not on file   Years of education: Not on file   Highest education level: Not on file  Occupational History   Not on file  Tobacco Use   Smoking status: Every Day    Current packs/day: 1.00    Average packs/day: 1 pack/day for 50.2 years (50.2 ttl pk-yrs)    Types: Cigarettes    Start date: 02/08/1968    Last attempt to quit: 02/07/2018   Smokeless tobacco: Never   Tobacco comments:    1PPD khj 01/07/2024  Vaping Use   Vaping status: Former  Substance and Sexual Activity   Alcohol use: Not Currently    Comment: rarely   Drug use: No   Sexual activity: Yes  Other Topics Concern   Not on file  Social History Narrative   Not on file   Social Drivers of Health   Financial Resource Strain: Not on file  Food Insecurity: No Food Insecurity (01/06/2024)   Hunger Vital Sign    Worried About Running Out of Food in the Last Year: Never true    Ran Out of Food in the Last Year: Never true  Transportation Needs: No Transportation Needs (01/06/2024)   PRAPARE - Administrator, Civil Service (Medical): No    Lack of Transportation (Non-Medical): No  Physical Activity: Not on file  Stress: No Stress Concern Present (01/06/2024)   [employer] of Occupational Health - Occupational Stress Questionnaire    Feeling of Stress : Not at all  Social Connections: Not on file  Intimate Partner Violence: Not At Risk (01/06/2024)   Humiliation, Afraid, Rape, and Kick questionnaire    Fear of Current or Ex-Partner: No  Emotionally  Abused: No    Physically Abused: No    Sexually Abused: No     Allergies  Allergen Reactions   Penicillins Hives    Has patient had a PCN reaction causing immediate rash, facial/tongue/throat swelling, SOB or lightheadedness with hypotension: Yes Has patient had a PCN reaction causing severe rash involving mucus membranes or skin necrosis: Unknown Has patient had a PCN reaction that required hospitalization: Yes Has patient had a PCN reaction occurring within the last 10 years: No If all of the above answers are "NO", then may proceed with Cephalosporin use.    Atorvastatin Other (See Comments)    Body Aches     CBC    Component Value Date/Time   WBC 9.7 12/25/2023 0744   RBC 4.52 12/25/2023 0744   HGB 14.4 12/25/2023 0744   HGB 15.3 03/12/2023 1332   HCT 43.3 12/25/2023 0744   HCT 45.5 03/12/2023 1332   PLT 194 12/25/2023 0744   PLT 171 03/12/2023 1332   MCV 95.8 12/25/2023 0744   MCV 96 03/12/2023 1332   MCH 31.9 12/25/2023 0744   MCHC 33.3 12/25/2023 0744   RDW 13.1 12/25/2023 0744   RDW 12.3 03/12/2023 1332   LYMPHSABS 3.5 01/20/2018 1454   MONOABS 0.5 01/20/2018 1454   EOSABS 0.4 01/20/2018 1454   BASOSABS 0.1 01/20/2018 1454    Pulmonary Functions Testing Results:    Latest Ref Rng & Units 01/01/2024    3:50 PM  PFT Results  FVC-Pre L 4.09   FVC-Predicted Pre % 86   FVC-Post L 4.14   FVC-Predicted Post % 87   Pre FEV1/FVC % % 63   Post FEV1/FCV % % 58   FEV1-Pre L 2.59   FEV1-Predicted Pre % 73   FEV1-Post L 2.40   DLCO uncorrected ml/min/mmHg 9.60   DLCO UNC% % 35   DLVA Predicted % 37   TLC L 6.64   TLC % Predicted % 92   RV % Predicted % 80     Outpatient Medications Prior to Visit  Medication Sig Dispense Refill   albuterol (VENTOLIN HFA) 108 (90 Base) MCG/ACT inhaler Inhale 1-2 puffs into the lungs every 6 (six) hours as needed for wheezing or shortness of breath.     amitriptyline (ELAVIL) 50 MG tablet Take 50 mg by mouth at bedtime.      aspirin 325 MG tablet Take 325 mg by mouth daily.     ciprofloxacin-dexamethasone (CIPRODEX) OTIC suspension Place 4 drops into both ears 2 (two) times daily for 5 days. DOS 08/19/23. 7.5 mL 0   citalopram (CELEXA) 40 MG tablet Take 40 mg by mouth every morning.      DULoxetine (CYMBALTA) 60 MG capsule Take 60 mg by mouth 2 (two) times daily.     fluticasone (FLONASE) 50 MCG/ACT nasal spray Place 2 sprays into both nostrils daily.     isosorbide mononitrate (IMDUR) 30 MG 24 hr tablet Take 30 mg by mouth daily.     lansoprazole (PREVACID) 15 MG capsule Take 15 mg by mouth daily at 12 noon.     meloxicam (MOBIC) 15 MG tablet Take 15 mg by mouth every morning.     nitroGLYCERIN (NITROSTAT) 0.4 MG SL tablet Place 0.4 mg under the tongue every 5 (five) minutes as needed for chest pain.     ofloxacin (OCUFLOX) 0.3 % ophthalmic solution Place 4 drops into the left ear 2 (two) times daily. 5 mL 5   omeprazole (PRILOSEC) 20 MG  capsule Take 20 mg by mouth daily.     pregabalin (LYRICA) 300 MG capsule Take 300 mg by mouth 2 (two) times daily.     rosuvastatin (CRESTOR) 20 MG tablet Take 20 mg by mouth daily.     spironolactone (ALDACTONE) 25 MG tablet Take 12.5 mg by mouth daily.     tamsulosin (FLOMAX) 0.4 MG CAPS capsule Take 0.4 mg by mouth daily after supper.     valsartan (DIOVAN) 320 MG tablet Take 320 mg by mouth daily.     No facility-administered medications prior to visit.

## 2024-01-08 ENCOUNTER — Ambulatory Visit
Admission: RE | Admit: 2024-01-08 | Discharge: 2024-01-08 | Disposition: A | Discharge status: Home / Self Care | Payer: Medicare HMO | Source: Ambulatory Visit | Attending: Oncology | Admitting: Oncology

## 2024-01-08 DIAGNOSIS — R001 Bradycardia, unspecified: Secondary | ICD-10-CM | POA: Diagnosis not present

## 2024-01-08 DIAGNOSIS — Z720 Tobacco use: Secondary | ICD-10-CM | POA: Diagnosis not present

## 2024-01-08 DIAGNOSIS — I6782 Cerebral ischemia: Secondary | ICD-10-CM | POA: Diagnosis not present

## 2024-01-08 DIAGNOSIS — I255 Ischemic cardiomyopathy: Secondary | ICD-10-CM | POA: Diagnosis not present

## 2024-01-08 DIAGNOSIS — C3490 Malignant neoplasm of unspecified part of unspecified bronchus or lung: Secondary | ICD-10-CM | POA: Diagnosis not present

## 2024-01-08 DIAGNOSIS — C7A1 Malignant poorly differentiated neuroendocrine tumors: Secondary | ICD-10-CM | POA: Diagnosis not present

## 2024-01-08 DIAGNOSIS — Z01810 Encounter for preprocedural cardiovascular examination: Secondary | ICD-10-CM | POA: Diagnosis not present

## 2024-01-08 DIAGNOSIS — I25810 Atherosclerosis of coronary artery bypass graft(s) without angina pectoris: Secondary | ICD-10-CM | POA: Diagnosis not present

## 2024-01-08 DIAGNOSIS — I10 Essential (primary) hypertension: Secondary | ICD-10-CM | POA: Diagnosis not present

## 2024-01-08 DIAGNOSIS — E782 Mixed hyperlipidemia: Secondary | ICD-10-CM | POA: Diagnosis not present

## 2024-01-08 MED ORDER — GADOBUTROL 1 MMOL/ML IV SOLN
8.0000 mL | Freq: Once | INTRAVENOUS | Status: AC | PRN
  Administered 2024-01-08: 8 mL via INTRAVENOUS

## 2024-01-09 ENCOUNTER — Institutional Professional Consult (permissible substitution): Payer: Medicare HMO | Admitting: Thoracic Surgery (Cardiothoracic Vascular Surgery)

## 2024-01-09 ENCOUNTER — Encounter: Payer: Self-pay | Admitting: Thoracic Surgery (Cardiothoracic Vascular Surgery)

## 2024-01-09 VITALS — BP 149/69 | HR 66 | Resp 20 | Ht 71.0 in | Wt 180.8 lb

## 2024-01-09 DIAGNOSIS — C7A1 Malignant poorly differentiated neuroendocrine tumors: Secondary | ICD-10-CM

## 2024-01-09 NOTE — Progress Notes (Signed)
[address]       [place] [address]             [phone]                    [name] [facility] Medical Record #[id number] Date of Birth: Dec 31, 1955  Referring: Raechel Chute, MD Primary Care: [name], PA-C Primary Cardiologist: None  Chief Complaint:    Chief Complaint  Patient presents with   Lung Cancer    Chest CT super D 1/29, Bronch 1/30, PET 1/29, PFTs 2/6    History of Present Illness:    [name] 68 y.o. male presents for surgical evaluation of a biopsy-proven left upper lobe non-small cell lung cancer.  He has a long history of smoking and continues to smoke.  He admits to some shortness of breath with minimal exertion.      Past Medical History:  Diagnosis Date   Anemia    Anginal pain (HCC)    Arthritis    Atherosclerotic peripheral vascular disease with intermittent claudication (HCC)    Bronchitis    h/o   CHF (congestive heart failure) (HCC)    Cirrhosis, non-alcoholic (HCC)    Coronary artery disease    Depression    Diabetes mellitus without complication (HCC)    diet controlled   Difficult intubation    Pt Denies   Dyspnea    with exertion   GERD (gastroesophageal reflux disease)    Heart failure with mid-range ejection fraction (HFmEF) (HCC)    History of MRSA infection 2010   Hx of CABG    Hyperlipidemia    Hypertension    Insomnia    Ischemic cardiomyopathy    Liver cirrhosis (HCC)    Peripheral vascular disease (HCC)    Stomach ulcer    Stomach ulcer    Wears hearing aid in both ears     Past Surgical History:  Procedure Laterality Date   ANTERIOR CERVICAL DECOMP/DISCECTOMY FUSION N/A 01/28/2018   Procedure: ANTERIOR CERVICAL DECOMPRESSION/DISCECTOMY FUSION 2 LEVELS C3-5;  Surgeon: [name], MD;  Location: [facility] ORS;  Service: Neurosurgery;  Laterality: N/A;   BRONCHIAL BIOPSY  12/25/2023   Procedure: BRONCHIAL BIOPSIES;  Surgeon: Raechel Chute, MD;  Location: [facility]  ENDOSCOPY;  Service: Pulmonary;;   BRONCHIAL BRUSHINGS  12/25/2023   Procedure: BRONCHIAL BRUSHINGS;  Surgeon: Raechel Chute, MD;  Location: [facility] ENDOSCOPY;  Service: Pulmonary;;   BRONCHIAL NEEDLE ASPIRATION BIOPSY  12/25/2023   Procedure: BRONCHIAL NEEDLE ASPIRATION BIOPSIES;  Surgeon: Raechel Chute, MD;  Location: [facility] ENDOSCOPY;  Service: Pulmonary;;   BRONCHIAL WASHINGS  12/25/2023   Procedure: BRONCHIAL WASHINGS;  Surgeon: Raechel Chute, MD;  Location: [facility] ENDOSCOPY;  Service: Pulmonary;;   COLON SURGERY  2004   due to diverticulitis.  Partail colectomy with Anastamosis   COLONOSCOPY N/A 03/27/2023   Procedure: COLONOSCOPY;  Surgeon: [name], MD;  Location: [facility] ENDOSCOPY;  Service: Gastroenterology;  Laterality: N/A;   COLONOSCOPY WITH PROPOFOL N/A 03/26/2023   Procedure: COLONOSCOPY WITH PROPOFOL;  Surgeon: [name], MD;  Location: [facility] ENDOSCOPY;  Service: Gastroenterology;  Laterality: N/A;   CORONARY ARTERY BYPASS GRAFT     ESOPHAGOGASTRODUODENOSCOPY (EGD) WITH PROPOFOL N/A 03/26/2023   Procedure: ESOPHAGOGASTRODUODENOSCOPY (EGD) WITH PROPOFOL;  Surgeon: [name], MD;  Location: [facility] ENDOSCOPY;  Service: Gastroenterology;  Laterality: N/A;   FINE NEEDLE ASPIRATION  12/25/2023   Procedure: FINE NEEDLE ASPIRATION (FNA) LINEAR;  Surgeon: Aundria Rud,  Lianne Bushy, MD;  Location: [facility] ENDOSCOPY;  Service: Pulmonary;;   HAND TENDON SURGERY Right    right thumb   LUMBAR LAMINECTOMY/DECOMPRESSION MICRODISCECTOMY N/A 09/01/2019   Procedure: LUMBAR LAMINECTOMY/DECOMPRESSION MICRODISCECTOMY 3 LEVELS L2-L5;  Surgeon: [name], MD;  Location: [facility] ORS;  Service: Neurosurgery;  Laterality: N/A;   neck fusion     SHOULDER ARTHROSCOPY WITH OPEN ROTATOR CUFF REPAIR Right 04/08/2016   Procedure: SHOULDER ARTHROSCOPY WITH OPEN ROTATOR CUFF REPAIR;  Surgeon: [name], MD;  Location: [facility] ORS;  Service: Orthopedics;  Laterality: Right;   TONSILLECTOMY      TRANSFORAMINAL LUMBAR INTERBODY FUSION W/ MIS 1 LEVEL N/A 09/01/2019   Procedure: MINIMALLY INVASIVE (MIS) TRANSFORAMINAL LUMBAR INTERBODY FUSION (TLIF) 1 LEVEL L5-S1;  Surgeon: [name], MD;  Location: [facility] ORS;  Service: Neurosurgery;  Laterality: N/A;   TYMPANOPLASTY Right 08/19/2023   Procedure: TYMPANOPLASTY WITH REMOVAL OF CHOLESTEATOMA;  Surgeon: Geanie Logan, MD;  Location: [facility];  Service: ENT;  Laterality: Right;   VIDEO BRONCHOSCOPY WITH ENDOBRONCHIAL ULTRASOUND  12/25/2023   Procedure: VIDEO BRONCHOSCOPY WITH ENDOBRONCHIAL ULTRASOUND;  Surgeon: Raechel Chute, MD;  Location: [facility] ENDOSCOPY;  Service: Pulmonary;;   VIDEO BRONCHOSCOPY WITH RADIAL ENDOBRONCHIAL ULTRASOUND  12/25/2023   Procedure: VIDEO BRONCHOSCOPY WITH RADIAL ENDOBRONCHIAL ULTRASOUND;  Surgeon: Raechel Chute, MD;  Location: [facility] ENDOSCOPY;  Service: Pulmonary;;    No family history on file.   Social History   Tobacco Use  Smoking Status Every Day   Current packs/day: 1.00   Average packs/day: 1 pack/day for 50.2 years (50.2 ttl pk-yrs)   Types: Cigarettes   Start date: 02/08/1968   Last attempt to quit: 02/07/2018  Smokeless Tobacco Never  Tobacco Comments   1PPD khj 01/07/2024    Social History   Substance and Sexual Activity  Alcohol Use Not Currently   Comment: rarely     Allergies  Allergen Reactions   Penicillins Hives    Has patient had a PCN reaction causing immediate rash, facial/tongue/throat swelling, SOB or lightheadedness with hypotension: Yes Has patient had a PCN reaction causing severe rash involving mucus membranes or skin necrosis: Unknown Has patient had a PCN reaction that required hospitalization: Yes Has patient had a PCN reaction occurring within the last 10 years: No If all of the above answers are "NO", then may proceed with Cephalosporin use.    Atorvastatin Other (See Comments)    Body Aches    Current Outpatient Medications  Medication Sig  Dispense Refill   albuterol (VENTOLIN HFA) 108 (90 Base) MCG/ACT inhaler Inhale 1-2 puffs into the lungs every 6 (six) hours as needed for wheezing or shortness of breath.     amitriptyline (ELAVIL) 50 MG tablet Take 50 mg by mouth at bedtime.     aspirin 325 MG tablet Take 325 mg by mouth daily.     ciprofloxacin-dexamethasone (CIPRODEX) OTIC suspension Place 4 drops into both ears 2 (two) times daily for 5 days. DOS 08/19/23. 7.5 mL 0   citalopram (CELEXA) 40 MG tablet Take 40 mg by mouth every morning.      DULoxetine (CYMBALTA) 60 MG capsule Take 60 mg by mouth 2 (two) times daily.     fluticasone (FLONASE) 50 MCG/ACT nasal spray Place 2 sprays into both nostrils daily.     isosorbide mononitrate (IMDUR) 30 MG 24 hr tablet Take 30 mg by mouth daily.     lansoprazole (PREVACID) 15 MG capsule Take 15 mg by mouth daily at 12 noon.     meloxicam (MOBIC)  15 MG tablet Take 15 mg by mouth every morning.     nitroGLYCERIN (NITROSTAT) 0.4 MG SL tablet Place 0.4 mg under the tongue every 5 (five) minutes as needed for chest pain.     ofloxacin (OCUFLOX) 0.3 % ophthalmic solution Place 4 drops into the left ear 2 (two) times daily. 5 mL 5   omeprazole (PRILOSEC) 20 MG capsule Take 20 mg by mouth daily.     pregabalin (LYRICA) 300 MG capsule Take 300 mg by mouth 2 (two) times daily.     rosuvastatin (CRESTOR) 20 MG tablet Take 20 mg by mouth daily.     spironolactone (ALDACTONE) 25 MG tablet Take 12.5 mg by mouth daily.     tamsulosin (FLOMAX) 0.4 MG CAPS capsule Take 0.4 mg by mouth daily after supper.     umeclidinium-vilanterol (ANORO ELLIPTA) 62.5-25 MCG/ACT AEPB Inhale 1 puff into the lungs daily. 30 each 11   valsartan (DIOVAN) 320 MG tablet Take 320 mg by mouth daily.     No current facility-administered medications for this visit.    Review of Systems  Constitutional:  Negative for malaise/fatigue.  Respiratory:  Positive for cough and shortness of breath.   Neurological: Negative.       PHYSICAL EXAMINATION: BP (!) 149/69 (BP Location: Right Arm, Patient Position: Sitting, Cuff Size: Normal)   Pulse 66   Resp 20   Ht 5\' 11"  (1.803 m)   Wt 180 lb 12.8 oz (82 kg)   SpO2 98% Comment: RA  BMI 25.22 kg/m  Physical Exam Constitutional:      General: He is not in acute distress.    Appearance: He is not ill-appearing.  HENT:     Head: Normocephalic and atraumatic.  Eyes:     Extraocular Movements: Extraocular movements intact.  Abdominal:     General: Abdomen is flat. There is no distension.  Skin:    General: Skin is warm and dry.  Neurological:     General: No focal deficit present.     Mental Status: He is alert and oriented to person, place, and time.          I have independently reviewed the above radiology studies  and reviewed the findings with the patient.   Recent Lab Findings: Lab Results  Component Value Date   WBC 9.7 12/25/2023   HGB 14.4 12/25/2023   HCT 43.3 12/25/2023   PLT 194 12/25/2023   GLUCOSE 196 (H) 12/25/2023   CHOL 224 (H) 03/18/2023   TRIG 233 (H) 03/18/2023   HDL 26 (L) 10/23/2010   LDLCALC 112 (H) 10/23/2010   ALT 32 12/25/2023   AST 36 12/25/2023   NA 136 12/25/2023   K 3.8 12/25/2023   CL 102 12/25/2023   CREATININE 1.10 12/25/2023   BUN 12 12/25/2023   CO2 23 12/25/2023   INR 1.0 03/12/2023    Diagnostic Studies & Laboratory data:     Recent Radiology Findings:   CT SUPER D CHEST WO CONTRAST Result Date: 12/29/2023 CLINICAL DATA:  Left upper lobe pulmonary nodule. Pre EMB and biopsy. EXAM: CT CHEST WITHOUT CONTRAST TECHNIQUE: Multidetector CT imaging of the chest was performed using thin slice collimation for electromagnetic bronchoscopy planning purposes, without intravenous contrast. RADIATION DOSE REDUCTION: This exam was performed according to the departmental dose-optimization program which includes automated exposure control, adjustment of the mA and/or kV according to patient size and/or use of  iterative reconstruction technique. COMPARISON:  PET-CT 12/24/2023 FINDINGS: Cardiovascular: The heart is normal  in size. No pericardial effusion. The aorta is normal in caliber. Scattered atherosclerotic calcifications. Three-vessel coronary artery calcifications and evidence of prior coronary artery bypass surgery. Mediastinum/Nodes: Scattered mediastinal and hilar lymph nodes are stable. No overt adenopathy. The esophagus is grossly normal. Lungs/Pleura: Stable 16 mm left upper lobe pulmonary nodule. This was hypermetabolic on the PET-CT and is consistent with a small primary lung neoplasm. No metastatic pulmonary nodules are identified. Stable significant underlying lung disease with emphysema and pulmonary scarring. No pleural effusions or pleural lesions. Upper Abdomen: Stable cirrhotic changes involving the liver. No obvious hepatic lesions. Evidence of portal venous hypertension and portal venous collaterals. Upper abdominal lymph nodes typical with cirrhosis. No adrenal gland lesions are identified. Musculoskeletal: No significant bony findings. Age advanced degenerative changes involving the thoracic spine and cervical spine fusion hardware noted. IMPRESSION: 1. Stable 16 mm left upper lobe pulmonary nodule. This was hypermetabolic on the PET-CT and is consistent with a small primary lung neoplasm. 2. No metastatic pulmonary nodules are identified. 3. Stable significant underlying lung disease with emphysema and pulmonary scarring. 4. Stable cirrhotic changes involving the liver. Evidence of portal venous hypertension and portal venous collaterals. 5. Age advanced degenerative changes involving the thoracic spine and cervical spine fusion hardware noted. Electronically Signed   By: Rudie Meyer M.D.   On: 12/29/2023 21:25   NM PET Image Initial (PI) Skull Base To Thigh (F-18 FDG) Result Date: 12/29/2023 CLINICAL DATA:  Subsequent treatment strategy for pulmonary nodule. EXAM: NUCLEAR MEDICINE PET SKULL  BASE TO THIGH TECHNIQUE: 10.2 mCi F-18 FDG was injected intravenously. Full-ring PET imaging was performed from the skull base to thigh after the radiotracer. CT data was obtained and used for attenuation correction and anatomic localization. Fasting blood glucose: 179 mg/dl COMPARISON:  Chest CT 16/08/9603 FINDINGS: NECK: No hypermetabolic lymph nodes in the neck. Incidental CT findings: None. CHEST: Lobular LEFT upper lobe pulmonary nodule measures 17 mm (image 52) with SUV max equal 10.5. No additional hypermetabolic or enlarged pulmonary nodules. No hypermetabolic mediastinal lymph nodes. Incidental CT findings: Post CABG ABDOMEN/PELVIS: No abnormal hypermetabolic activity within the liver, pancreas, adrenal glands, or spleen. No hypermetabolic lymph nodes in the abdomen or pelvis. Incidental CT findings: Liver has a nodular contour caudate lobe enlarged. SKELETON: Intense activity associated with the RIGHT shoulder reverse arthroplasty. Intense mint metabolic activity involving the joint space and extending along the stem of the prosthetic Posterior lumbar fusion. Incidental CT findings: None. IMPRESSION: 1. Intense metabolic activity associated with a LEFT upper lobe pulmonary nodule consistent primary bronchogenic carcinoma. 2. No evidence of metastatic adenopathy in the mediastinum. No distant metastatic disease. 3. Intense metabolic activity associated with the RIGHT shoulder versus arthroplasty. Recommend correlation for loosening or infection. 4. Cirrhotic morphology of the liver. 5. Post CABG. Electronically Signed   By: Genevive Bi M.D.   On: 12/29/2023 16:34   DG Chest Port 1 View Result Date: 12/25/2023 CLINICAL DATA:  Post bronchoscopy. EXAM: PORTABLE CHEST 1 VIEW COMPARISON:  CT 12/24/2023 FINDINGS: Subtle haziness surrounding the LEFT upper lobe nodule. No pneumothorax. No pleural fluid. Midline sternotomy. Anterior cervical fusion. IMPRESSION: 1. No pneumothorax following bronchoscopy. 2.  Mild haziness around the LEFT upper lobe nodule. Electronically Signed   By: Genevive Bi M.D.   On: 12/25/2023 12:01   DG C-ARM BRONCHOSCOPY Result Date: 12/25/2023 C-ARM BRONCHOSCOPY: Fluoroscopy was utilized by the requesting physician.  No radiographic interpretation.     PFTs:  - FVC: 86% - FEV1: 73% -DLCO: 35%  FINAL MICROSCOPIC DIAGNOSIS:  A.  LEFT LUNG, UPPER LOBE, NODULE, FINE NEEDLE ASPIRATION  BIOPSY:  - Malignant  - Non-small cell carcinoma (see comment)   B.  LEFT LUNG, UPPER LOBE, NODULE, BRUSHING:  - Malignant  - Non-small cell carcinoma (see comment)    Assessment / Plan:   68 year old male with a left upper lobe non-small cell lung cancer.  On review his pulmonary function testing, his DLCO was 35%, which would put him at high risk for requiring lifelong oxygen if he were to undergo resection.  He states that he did not have any difficulty with the pulmonary function testing, thus I do think that the results are accurate.  On review of his CT he does have significant emphysematous disease.  Given his marginal lung function, I recommended that he meet with radiation oncology for further discussion of treatment.     I  spent 40 minutes with  the patient face to face in counseling and coordination of care.    Corliss Skains 01/09/2024 11:11 AM

## 2024-01-13 ENCOUNTER — Ambulatory Visit: Payer: Medicare HMO

## 2024-01-21 ENCOUNTER — Ambulatory Visit
Admission: RE | Admit: 2024-01-21 | Discharge: 2024-01-21 | Disposition: A | Discharge status: Home / Self Care | Payer: Medicare HMO | Source: Ambulatory Visit | Attending: Radiation Oncology | Admitting: Radiation Oncology

## 2024-01-21 ENCOUNTER — Inpatient Hospital Stay: Payer: Medicare HMO | Admitting: Oncology

## 2024-01-21 ENCOUNTER — Encounter: Payer: Self-pay | Admitting: Oncology

## 2024-01-21 ENCOUNTER — Encounter: Payer: Self-pay | Admitting: *Deleted

## 2024-01-21 ENCOUNTER — Encounter: Payer: Self-pay | Admitting: Radiation Oncology

## 2024-01-21 VITALS — BP 154/75 | HR 69 | Temp 97.8°F | Resp 18 | Wt 181.9 lb

## 2024-01-21 VITALS — BP 154/75 | HR 69 | Temp 97.8°F | Resp 18 | Ht 71.0 in | Wt 181.9 lb

## 2024-01-21 DIAGNOSIS — G8929 Other chronic pain: Secondary | ICD-10-CM

## 2024-01-21 DIAGNOSIS — K7469 Other cirrhosis of liver: Secondary | ICD-10-CM

## 2024-01-21 DIAGNOSIS — M25511 Pain in right shoulder: Secondary | ICD-10-CM | POA: Diagnosis not present

## 2024-01-21 DIAGNOSIS — G939 Disorder of brain, unspecified: Secondary | ICD-10-CM | POA: Diagnosis not present

## 2024-01-21 DIAGNOSIS — C3412 Malignant neoplasm of upper lobe, left bronchus or lung: Secondary | ICD-10-CM | POA: Diagnosis not present

## 2024-01-21 DIAGNOSIS — C7A1 Malignant poorly differentiated neuroendocrine tumors: Secondary | ICD-10-CM

## 2024-01-21 DIAGNOSIS — F17210 Nicotine dependence, cigarettes, uncomplicated: Secondary | ICD-10-CM | POA: Diagnosis not present

## 2024-01-21 NOTE — Progress Notes (Signed)
 Met with patient and his daughter during follow up visit with Dr. Cathie Hoops. All questions answered during visit. Reassurance provided. Reviewed upcoming appts. Informed that will follow up at next clinic visit to check in regarding decision to start chemotherapy with radiation. Pt remains undecided and requests to further discuss with his other children. Contact info given and instructed to call with any questions or needs. Pt verbalized understanding.

## 2024-01-21 NOTE — Assessment & Plan Note (Addendum)
 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 Not a surgical candidate.  I discussed about the aggressive nature of histology and overall poor prognosis  I recommend radiation and systematic chemotherapy.  He has baseline hearing deficiency, I recommend carboplatin and Etoposide for 4 cycles. .  Rationale potential side effects of chemotherapy was reviewed and discussed with patient and daughter. Patient is undecided and would like to further discuss with family members and decide.  If patient agrees with chemotherapy, plan to arrange chemotherapy class.  Tentative plan to start chemotherapy concurrently or after radiation therapy.

## 2024-01-21 NOTE — Assessment & Plan Note (Signed)
 Compensated.  Recommend patient continue follow-up with GI

## 2024-01-21 NOTE — Consult Note (Signed)
 NEW PATIENT EVALUATION  Name: [name]  MRN: [id number]  Date:   01/21/2024     DOB: 02/03/1956   This 67 y.o. male patient presents to the clinic for initial evaluation of stage I non-small cell lung cancer of the left upper lobe.  REFERRING PHYSICIAN: Corliss Skains, MD  CHIEF COMPLAINT:  Chief Complaint  Patient presents with   Lung Cancer    Consult    DIAGNOSIS: There were no encounter diagnoses.   PREVIOUS INVESTIGATIONS:  CT scan PET CT scan reviewed Clinical notes reviewed Pathology reports reviewed  HPI: Patient is a 68 year old male who has been followed by pain lesion detected in his l lingula which presented as an enlarging microlobulated nodule 1.6 cm in size.  PET scan showed intense metabolic activity associated with left upper lobe pulmonary nodule consistent with primary bronchogenic carcinoma.  There is no evidence of adenopathy mediastinum or distant metastatic disease.  He did have intense metabolic activity associate his right shoulder for which she will be consulted by orthopedic surgery most likely nonmalignant.  Pulmonary MRI there is a single 3 mm focus of enhancement left basal ganglia without associated T2 flair favored to represent a venous anomaly.  Biopsy of left upper lobe pulmonary nodule was positive for malignant cells tumor was classified as a non-small cell lung cancer.  He has been seen by surgery and declined based on pulmonary functions for surgical resection.  He is seen today for radiation oncology evaluation he is doing fairly well does have some moderate dyspnea on exertion no significant hemoptysis no cough.  PLANNED TREATMENT REGIMEN: SBRT  PAST MEDICAL HISTORY:  has a past medical history of Anemia, Anginal pain (HCC), Arthritis, Atherosclerotic peripheral vascular disease with intermittent claudication (HCC), Bronchitis, CHF (congestive heart failure) (HCC), Cirrhosis, non-alcoholic (HCC), Coronary artery disease, Depression,  Diabetes mellitus without complication (HCC), Difficult intubation, Dyspnea, GERD (gastroesophageal reflux disease), Heart failure with mid-range ejection fraction (HFmEF) (HCC), History of MRSA infection (2010), CABG, Hyperlipidemia, Hypertension, Insomnia, Ischemic cardiomyopathy, Liver cirrhosis (HCC), Peripheral vascular disease (HCC), Stomach ulcer, Stomach ulcer, and Wears hearing aid in both ears.    PAST SURGICAL HISTORY:  Past Surgical History:  Procedure Laterality Date   ANTERIOR CERVICAL DECOMP/DISCECTOMY FUSION N/A 01/28/2018   Procedure: ANTERIOR CERVICAL DECOMPRESSION/DISCECTOMY FUSION 2 LEVELS C3-5;  Surgeon: [name], MD;  Location: [facility];  Service: Neurosurgery;  Laterality: N/A;   BRONCHIAL BIOPSY  12/25/2023   Procedure: BRONCHIAL BIOPSIES;  Surgeon: Raechel Chute, MD;  Location: [facility];  Service: Pulmonary;;   BRONCHIAL BRUSHINGS  12/25/2023   Procedure: BRONCHIAL BRUSHINGS;  Surgeon: Raechel Chute, MD;  Location: [facility];  Service: Pulmonary;;   BRONCHIAL NEEDLE ASPIRATION BIOPSY  12/25/2023   Procedure: BRONCHIAL NEEDLE ASPIRATION BIOPSIES;  Surgeon: Raechel Chute, MD;  Location: [facility];  Service: Pulmonary;;   BRONCHIAL WASHINGS  12/25/2023   Procedure: BRONCHIAL WASHINGS;  Surgeon: Raechel Chute, MD;  Location: [facility];  Service: Pulmonary;;   COLON SURGERY  2004   due to diverticulitis.  Partail colectomy with Anastamosis   COLONOSCOPY N/A 03/27/2023   Procedure: COLONOSCOPY;  Surgeon: [name], MD;  Location: [facility] ENDOSCOPY;  Service: Gastroenterology;  Laterality: N/A;   COLONOSCOPY WITH PROPOFOL N/A 03/26/2023   Procedure: COLONOSCOPY WITH PROPOFOL;  Surgeon: [name], MD;  Location: [facility] ENDOSCOPY;  Service: Gastroenterology;  Laterality: N/A;   CORONARY ARTERY BYPASS GRAFT     ESOPHAGOGASTRODUODENOSCOPY (EGD) WITH PROPOFOL N/A 03/26/2023   Procedure: ESOPHAGOGASTRODUODENOSCOPY (EGD) WITH PROPOFOL;  Surgeon: [name], MD;  Location: [facility];  Service: Gastroenterology;  Laterality: N/A;   FINE NEEDLE ASPIRATION  12/25/2023   Procedure: FINE NEEDLE ASPIRATION (FNA) LINEAR;  Surgeon: Raechel Chute, MD;  Location: [facility];  Service: Pulmonary;;   HAND TENDON SURGERY Right    right thumb   LUMBAR LAMINECTOMY/DECOMPRESSION MICRODISCECTOMY N/A 09/01/2019   Procedure: LUMBAR LAMINECTOMY/DECOMPRESSION MICRODISCECTOMY 3 LEVELS L2-L5;  Surgeon: [name], MD;  Location: [facility];  Service: Neurosurgery;  Laterality: N/A;   neck fusion     SHOULDER ARTHROSCOPY WITH OPEN ROTATOR CUFF REPAIR Right 04/08/2016   Procedure: SHOULDER ARTHROSCOPY WITH OPEN ROTATOR CUFF REPAIR;  Surgeon: [name], MD;  Location: [facility];  Service: Orthopedics;  Laterality: Right;   TONSILLECTOMY     TRANSFORAMINAL LUMBAR INTERBODY FUSION W/ MIS 1 LEVEL N/A 09/01/2019   Procedure: MINIMALLY INVASIVE (MIS) TRANSFORAMINAL LUMBAR INTERBODY FUSION (TLIF) 1 LEVEL L5-S1;  Surgeon: [name], MD;  Location: [facility];  Service: Neurosurgery;  Laterality: N/A;   TYMPANOPLASTY Right 08/19/2023   Procedure: TYMPANOPLASTY WITH REMOVAL OF CHOLESTEATOMA;  Surgeon: Geanie Logan, MD;  Location: [facility];  Service: ENT;  Laterality: Right;   VIDEO BRONCHOSCOPY WITH ENDOBRONCHIAL ULTRASOUND  12/25/2023   Procedure: VIDEO BRONCHOSCOPY WITH ENDOBRONCHIAL ULTRASOUND;  Surgeon: Raechel Chute, MD;  Location: [facility];  Service: Pulmonary;;   VIDEO BRONCHOSCOPY WITH RADIAL ENDOBRONCHIAL ULTRASOUND  12/25/2023   Procedure: VIDEO BRONCHOSCOPY WITH RADIAL ENDOBRONCHIAL ULTRASOUND;  Surgeon: Raechel Chute, MD;  Location: [facility];  Service: Pulmonary;;    FAMILY HISTORY: family history is not on file.  SOCIAL HISTORY:  reports that he has been smoking cigarettes. He started smoking about 55 years ago. He has a 50.2 pack-year smoking history. He has never used smokeless tobacco. He  reports that he does not currently use alcohol. He reports that he does not use drugs.  ALLERGIES: Penicillins and Atorvastatin  MEDICATIONS:  Current Outpatient Medications  Medication Sig Dispense Refill   albuterol (VENTOLIN HFA) 108 (90 Base) MCG/ACT inhaler Inhale 1-2 puffs into the lungs every 6 (six) hours as needed for wheezing or shortness of breath.     amitriptyline (ELAVIL) 50 MG tablet Take 50 mg by mouth at bedtime.     aspirin 325 MG tablet Take 325 mg by mouth daily.     ciprofloxacin-dexamethasone (CIPRODEX) OTIC suspension Place 4 drops into both ears 2 (two) times daily for 5 days. DOS 08/19/23. 7.5 mL 0   citalopram (CELEXA) 40 MG tablet Take 40 mg by mouth every morning.      DULoxetine (CYMBALTA) 60 MG capsule Take 60 mg by mouth 2 (two) times daily.     fluticasone (FLONASE) 50 MCG/ACT nasal spray Place 2 sprays into both nostrils daily.     isosorbide mononitrate (IMDUR) 30 MG 24 hr tablet Take 30 mg by mouth daily.     lansoprazole (PREVACID) 15 MG capsule Take 15 mg by mouth daily at 12 noon.     meloxicam (MOBIC) 15 MG tablet Take 15 mg by mouth every morning.     nitroGLYCERIN (NITROSTAT) 0.4 MG SL tablet Place 0.4 mg under the tongue every 5 (five) minutes as needed for chest pain.     ofloxacin (OCUFLOX) 0.3 % ophthalmic solution Place 4 drops into the left ear 2 (two) times daily. 5 mL 5   omeprazole (PRILOSEC) 20 MG capsule Take 20 mg by mouth daily.     pregabalin (LYRICA) 300 MG capsule Take 300 mg by mouth 2 (  two) times daily.     rosuvastatin (CRESTOR) 20 MG tablet Take 20 mg by mouth daily.     spironolactone (ALDACTONE) 25 MG tablet Take 12.5 mg by mouth daily.     tamsulosin (FLOMAX) 0.4 MG CAPS capsule Take 0.4 mg by mouth daily after supper.     umeclidinium-vilanterol (ANORO ELLIPTA) 62.5-25 MCG/ACT AEPB Inhale 1 puff into the lungs daily. 30 each 11   valsartan (DIOVAN) 320 MG tablet Take 320 mg by mouth daily.     No current facility-administered  medications for this encounter.    ECOG PERFORMANCE STATUS:  0 - Asymptomatic  REVIEW OF SYSTEMS: Patient denies any weight loss, fatigue, weakness, fever, chills or night sweats. Patient denies any loss of vision, blurred vision. Patient denies any ringing  of the ears or hearing loss. No irregular heartbeat. Patient denies heart murmur or history of fainting. Patient denies any chest pain or pain radiating to her upper extremities. Patient denies any shortness of breath, difficulty breathing at night, cough or hemoptysis. Patient denies any swelling in the lower legs. Patient denies any nausea vomiting, vomiting of blood, or coffee ground material in the vomitus. Patient denies any stomach pain. Patient states has had normal bowel movements no significant constipation or diarrhea. Patient denies any dysuria, hematuria or significant nocturia. Patient denies any problems walking, swelling in the joints or loss of balance. Patient denies any skin changes, loss of hair or loss of weight. Patient denies any excessive worrying or anxiety or significant depression. Patient denies any problems with insomnia. Patient denies excessive thirst, polyuria, polydipsia. Patient denies any swollen glands, patient denies easy bruising or easy bleeding. Patient denies any recent infections, allergies or URI. Patient "s visual fields have not changed significantly in recent time.   PHYSICAL EXAM: BP (!) 154/75   Pulse 69   Temp 97.8 F (36.6 C) (Tympanic)   Resp 18   Ht 5\' 11"  (1.803 m)   Wt 181 lb 14.4 oz (82.5 kg)   BMI 25.37 kg/m  Well-developed well-nourished patient in NAD. HEENT reveals PERLA, EOMI, discs not visualized.  Oral cavity is clear. No oral mucosal lesions are identified. Neck is clear without evidence of cervical or supraclavicular adenopathy. Lungs are clear to A&P. Cardiac examination is essentially unremarkable with regular rate and rhythm without murmur rub or thrill. Abdomen is benign with no  organomegaly or masses noted. Motor sensory and DTR levels are equal and symmetric in the upper and lower extremities. Cranial nerves II through XII are grossly intact. Proprioception is intact. No peripheral adenopathy or edema is identified. No motor or sensory levels are noted. Crude visual fields are within normal range.  LABORATORY DATA: Cytology reports reviewed    RADIOLOGY RESULTS: MRI scans CT scans and PET scan all reviewed compatible with above-stated findings   IMPRESSION: Stage I non-small cell lung cancer left upper lobe in 68 year old male  PLAN: This time I have offered SBRT to his left upper lobe non-small cell lung cancer.  Would plan on delivering 60 Gray in 5 fractions.  Risks and benefits of treatment including possible development of a cough slight fatigue architectural distortion of his lung all were discussed in detail with the patient.  He comprehends my treatment plan well.  I have personally set up and ordered CT simulation for early next week.  I would like to take this opportunity to thank you for allowing me to participate in the care of your patient.Carmina Miller, MD

## 2024-01-21 NOTE — Progress Notes (Signed)
 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 Not a surgical candidate.  I discussed about the aggressive nature of histology and overall poor prognosis  I recommend radiation and systematic chemotherapy.  He has baseline hearing deficiency, I recommend carboplatin and Etoposide for 4 cycles. .  Rationale potential side effects of chemotherapy was reviewed and discussed with patient and daughter. Patient is undecided and would like to further discuss with family members and decide.  If patient agrees with chemotherapy, plan to arrange chemotherapy class.  Tentative plan to start chemotherapy concurrently or after radiation therapy.  Right shoulder pain There is intense activity on PET scan, concerning for infection or inflammation recommend patient to contact orthopedic surgeon for further evaluation.  Other cirrhosis of liver (HCC) Compensated.  Recommend patient continue follow-up with GI   Orders Placed This Encounter  Procedures   MR Brain W Wo Contrast    Standing Status:   Future    Expected Date:   03/03/2024    Expiration Date:   01/20/2025    If indicated for the ordered procedure, I authorize the administration of contrast media per Radiology protocol:   Yes    What is the patient's sedation requirement?:   No Sedation    Does the patient have a pacemaker or implanted devices?:   No    Use SRS Protocol?:   No    Preferred imaging location?:   [facility] (table limit - 550lbs)   Follow-up to be determined. All questions were answered. The patient knows to call the clinic with any problems, questions or concerns.  Rickard Patience, MD, PhD [facility] Hematology Oncology 01/21/2024    HISTORY OF  PRESENTING ILLNESS:  [name] 68 y.o. 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 I have reviewed his chart and materials related to his cancer extensively and collaborated history with the patient. 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 Enlarging microlobulated lingular nodule, now 15.9 mm in size. Lung-RADS 4B, suspicious. Additional imaging evaluation or consultation with Pulmonology or Thoracic Surgery recommended. These results will be called to the ordering clinician or representative by the Radiologist Assistant, and communication documented in the PACS or [employer]. 2. New 4.4 cm lingular nodule.  Recommend attention on follow-up. 3. Cirrhosis. 4.  Aortic atherosclerosis (ICD10-I70.0). 5.  Emphysema    01/06/2024 Initial Diagnosis   High grade neuroendocrine carcinoma of lung   Patient was evaluated by pulmonology and underwent biopsy via bronchoscopy. A.  LEFT LUNG, UPPER LOBE, NODULE, FINE NEEDLE ASPIRATION  BIOPSY:  - Malignant  - Non-small cell carcinoma (see comment)   B.  LEFT LUNG, UPPER LOBE, NODULE, BRUSHING:  - Malignant  - Non-small cell carcinoma (see comment)   ADDENDUM 1 :  Immunohistochemistry is performed to classify the carcinoma.  The tumor cells are negative with TTF-1 and p40.  Additional  immunohistochemistry will be performed and reported in a subsequent  addendum.   ADDENDUM 2: Additional immunohistochemistry is performed to better  characterize the carcinoma and the malignant cells are positive with  synaptophysin and negative with cytokeratin 5/6, CD56 and chromogranin.  Ki-67 shows a moderately increased proliferation rate.  The presence of  synaptophysin positivity is most consistent with intermediate to  high-grade neuroendocrine carcinoma   -- I discussed  with pathologist and confirmed the large cell neuroendocrine carcinoma histology.  D. LYMPH NODE, STATION 7, FINE NEEDLE ASPIRATION:  - Negative for malignancy  - Compatible with a benign lymph node   E. LYMPH NODE, STATION 11L, FINE NEEDLE ASPIRATION:  - Negative for malignancy  - Compatible with a benign lymph node   F. LUNG, LUL, LAVAGE:   FINAL MICROSCOPIC DIAGNOSIS:  - No malignant cells identified  - Pulmonary macrophages and benign bronchial cells    01/06/2024 Cancer Staging   Staging form: Lung, AJCC V9 - Clinical: Stage IA2 (cT1b, cN0, cM0) - Signed by Rickard Patience, MD on 01/06/2024   10/27/2024 Imaging   PET scan showed 1. Intense metabolic activity associated with a LEFT upper lobe pulmonary nodule consistent primary bronchogenic carcinoma. 2. No evidence of metastatic adenopathy in the mediastinum. No distant metastatic disease. 3. Intense metabolic activity associated with the RIGHT shoulder versus arthroplasty. Recommend correlation for loosening or infection. 4. Cirrhotic morphology of the liver. 5. Post CABG.    Patient was accompanied by daughter today. Denies unintentional weight loss, hemoptysis headache double vision. He has a history of smoking, currently smoking about a pack of cigarettes per day.  Denies any alcohol use. He experiences ongoing pain and swelling in his right shoulder, where he had a reverse shoulder replacement approximately four to five years ago. The PET scan showed activity in this area, raising concerns about possible infection or chronic inflammation. He has limited range of motion and is unable to lift his arm beyond a certain point.   The patient presents for follow-up.  He was seen by cardiology recently was cleared to proceed with surgery.  However due to decreased pulmonary function, he was felt not a surgical candidate. Patient presents to discuss nonsurgical alternative treatments.  He has met with radiation oncology Dr. Rushie Chestnut  and has been offered SBRT.     MEDICAL HISTORY:  Past Medical History:  Diagnosis Date   Anemia    Anginal pain (HCC)    Arthritis    Atherosclerotic peripheral vascular disease with intermittent claudication (HCC)    Bronchitis    h/o   CHF (congestive heart failure) (HCC)    Cirrhosis, non-alcoholic (HCC)    Coronary artery disease    Depression    Diabetes mellitus without complication (HCC)    diet controlled   Difficult intubation    Pt Denies   Dyspnea    with exertion   GERD (gastroesophageal reflux disease)    Heart failure with mid-range ejection fraction (HFmEF) (HCC)    History of MRSA infection 2010   Hx of CABG    Hyperlipidemia    Hypertension    Insomnia    Ischemic cardiomyopathy    Liver cirrhosis (HCC)    Peripheral vascular disease (HCC)    Stomach ulcer    Stomach ulcer    Wears hearing aid in both ears     SURGICAL HISTORY: Past Surgical History:  Procedure Laterality Date   ANTERIOR CERVICAL DECOMP/DISCECTOMY FUSION N/A 01/28/2018   Procedure: ANTERIOR CERVICAL DECOMPRESSION/DISCECTOMY FUSION 2 LEVELS C3-5;  Surgeon: [name], MD;  Location: [facility] ORS;  Service: Neurosurgery;  Laterality: N/A;   BRONCHIAL BIOPSY  12/25/2023   Procedure: BRONCHIAL BIOPSIES;  Surgeon: Raechel Chute, MD;  Location: [facility] ENDOSCOPY;  Service: Pulmonary;;   BRONCHIAL  BRUSHINGS  12/25/2023   Procedure: BRONCHIAL BRUSHINGS;  Surgeon: Raechel Chute, MD;  Location[facility];  Service: Pulmonary;;   BRONCHIAL NEEDLE ASPIRATION BIOPSY  12/25/2023   Procedure: BRONCHIAL NEEDLE ASPIRATION BIOPSIES;  Surgeon: Raechel Chute, MD;  Location: [facility] ENDOSCOPY;  Service: Pulmonary;;   BRONCHIAL WASHINGS  12/25/2023   Procedure: BRONCHIAL WASHINGS;  Surgeon: Raechel Chute, MD;  Location: [facility] ENDOSCOPY;  Service: Pulmonary;;   COLON SURGERY  2004   due to diverticulitis.  Partail colectomy with Anastamosis   COLONOSCOPY N/A 03/27/2023   Procedure: COLONOSCOPY;   Surgeon: [name], MD;  Location: [facility] ENDOSCOPY;  Service: Gastroenterology;  Laterality: N/A;   COLONOSCOPY WITH PROPOFOL N/A 03/26/2023   Procedure: COLONOSCOPY WITH PROPOFOL;  Surgeon: [name], MD;  Location: [facility] ENDOSCOPY;  Service: Gastroenterology;  Laterality: N/A;   CORONARY ARTERY BYPASS GRAFT     ESOPHAGOGASTRODUODENOSCOPY (EGD) WITH PROPOFOL N/A 03/26/2023   Procedure: ESOPHAGOGASTRODUODENOSCOPY (EGD) WITH PROPOFOL;  Surgeon: [name], MD;  Location: [facility] ENDOSCOPY;  Service: Gastroenterology;  Laterality: N/A;   FINE NEEDLE ASPIRATION  12/25/2023   Procedure: FINE NEEDLE ASPIRATION (FNA) LINEAR;  Surgeon: Raechel Chute, MD;  Location: [facility] ENDOSCOPY;  Service: Pulmonary;;   HAND TENDON SURGERY Right    right thumb   LUMBAR LAMINECTOMY/DECOMPRESSION MICRODISCECTOMY N/A 09/01/2019   Procedure: LUMBAR LAMINECTOMY/DECOMPRESSION MICRODISCECTOMY 3 LEVELS L2-L5;  Surgeon: [name], MD;  Location: [facility] ORS;  Service: Neurosurgery;  Laterality: N/A;   neck fusion     SHOULDER ARTHROSCOPY WITH OPEN ROTATOR CUFF REPAIR Right 04/08/2016   Procedure: SHOULDER ARTHROSCOPY WITH OPEN ROTATOR CUFF REPAIR;  Surgeon: [name], MD;  Location: [facility] ORS;  Service: Orthopedics;  Laterality: Right;   TONSILLECTOMY     TRANSFORAMINAL LUMBAR INTERBODY FUSION W/ MIS 1 LEVEL N/A 09/01/2019   Procedure: MINIMALLY INVASIVE (MIS) TRANSFORAMINAL LUMBAR INTERBODY FUSION (TLIF) 1 LEVEL L5-S1;  Surgeon: [name], MD;  Location: [facility] ORS;  Service: Neurosurgery;  Laterality: N/A;   TYMPANOPLASTY Right 08/19/2023   Procedure: TYMPANOPLASTY WITH REMOVAL OF CHOLESTEATOMA;  Surgeon: Geanie Logan, MD;  Location: [facility];  Service: ENT;  Laterality: Right;   VIDEO BRONCHOSCOPY WITH ENDOBRONCHIAL ULTRASOUND  12/25/2023   Procedure: VIDEO BRONCHOSCOPY WITH ENDOBRONCHIAL ULTRASOUND;  Surgeon: Raechel Chute, MD;  Location: [facility] ENDOSCOPY;  Service:  Pulmonary;;   VIDEO BRONCHOSCOPY WITH RADIAL ENDOBRONCHIAL ULTRASOUND  12/25/2023   Procedure: VIDEO BRONCHOSCOPY WITH RADIAL ENDOBRONCHIAL ULTRASOUND;  Surgeon: Raechel Chute, MD;  Location: [facility] ENDOSCOPY;  Service: Pulmonary;;    SOCIAL HISTORY: Social History   Socioeconomic History   Marital status: Married    Spouse name: Not on file   Number of children: Not on file   Years of education: Not on file   Highest education level: Not on file  Occupational History   Not on file  Tobacco Use   Smoking status: Every Day    Current packs/day: 1.00    Average packs/day: 1 pack/day for 50.2 years (50.2 ttl pk-yrs)    Types: Cigarettes    Start date: 02/08/1968    Last attempt to quit: 02/07/2018   Smokeless tobacco: Never   Tobacco comments:    1PPD khj 01/07/2024  Vaping Use   Vaping status: Former  Substance and Sexual Activity   Alcohol use: Not Currently    Comment: rarely   Drug use: No   Sexual activity: Yes  Other Topics Concern   Not on file  Social History Narrative   Not on file   Social  Drivers of Corporate investment banker Strain: Not on file  Food Insecurity: No Food Insecurity (01/06/2024)   Hunger Vital Sign    Worried About Running Out of Food in the Last Year: Never true    Ran Out of Food in the Last Year: Never true  Transportation Needs: No Transportation Needs (01/06/2024)   PRAPARE - Administrator, Civil Service (Medical): No    Lack of Transportation (Non-Medical): No  Physical Activity: Not on file  Stress: No Stress Concern Present (01/06/2024)   [employer] of Occupational Health - Occupational Stress Questionnaire    Feeling of Stress : Not at all  Social Connections: Not on file  Intimate Partner Violence: Not At Risk (01/06/2024)   Humiliation, Afraid, Rape, and Kick questionnaire    Fear of Current or Ex-Partner: No    Emotionally Abused: No    Physically Abused: No    Sexually Abused: No    FAMILY HISTORY: History  reviewed. No pertinent family history.  ALLERGIES:  is allergic to penicillins and atorvastatin.  MEDICATIONS:  Current Outpatient Medications  Medication Sig Dispense Refill   albuterol (VENTOLIN HFA) 108 (90 Base) MCG/ACT inhaler Inhale 1-2 puffs into the lungs every 6 (six) hours as needed for wheezing or shortness of breath.     amitriptyline (ELAVIL) 50 MG tablet Take 50 mg by mouth at bedtime.     aspirin 325 MG tablet Take 325 mg by mouth daily.     ciprofloxacin-dexamethasone (CIPRODEX) OTIC suspension Place 4 drops into both ears 2 (two) times daily for 5 days. DOS 08/19/23. 7.5 mL 0   citalopram (CELEXA) 40 MG tablet Take 40 mg by mouth every morning.      DULoxetine (CYMBALTA) 60 MG capsule Take 60 mg by mouth 2 (two) times daily.     fluticasone (FLONASE) 50 MCG/ACT nasal spray Place 2 sprays into both nostrils daily.     isosorbide mononitrate (IMDUR) 30 MG 24 hr tablet Take 30 mg by mouth daily.     lansoprazole (PREVACID) 15 MG capsule Take 15 mg by mouth daily at 12 noon.     meloxicam (MOBIC) 15 MG tablet Take 15 mg by mouth every morning.     nitroGLYCERIN (NITROSTAT) 0.4 MG SL tablet Place 0.4 mg under the tongue every 5 (five) minutes as needed for chest pain.     ofloxacin (OCUFLOX) 0.3 % ophthalmic solution Place 4 drops into the left ear 2 (two) times daily. 5 mL 5   omeprazole (PRILOSEC) 20 MG capsule Take 20 mg by mouth daily.     pregabalin (LYRICA) 300 MG capsule Take 300 mg by mouth 2 (two) times daily.     rosuvastatin (CRESTOR) 20 MG tablet Take 20 mg by mouth daily.     spironolactone (ALDACTONE) 25 MG tablet Take 12.5 mg by mouth daily.     tamsulosin (FLOMAX) 0.4 MG CAPS capsule Take 0.4 mg by mouth daily after supper.     umeclidinium-vilanterol (ANORO ELLIPTA) 62.5-25 MCG/ACT AEPB Inhale 1 puff into the lungs daily. 30 each 11   valsartan (DIOVAN) 320 MG tablet Take 320 mg by mouth daily.     No current facility-administered medications for this visit.     Review of Systems  Constitutional:  Negative for appetite change, chills, fatigue, fever and unexpected weight change.  HENT:   Negative for hearing loss and voice change.   Eyes:  Negative for eye problems and icterus.  Respiratory:  Negative for chest  tightness, cough and shortness of breath.   Cardiovascular:  Negative for chest pain and leg swelling.  Gastrointestinal:  Negative for abdominal distention and abdominal pain.  Endocrine: Negative for hot flashes.  Genitourinary:  Negative for difficulty urinating, dysuria and frequency.   Musculoskeletal:  Positive for arthralgias.       Right shoulder pain with limited motion  Skin:  Negative for itching and rash.  Neurological:  Negative for light-headedness and numbness.  Hematological:  Negative for adenopathy. Does not bruise/bleed easily.  Psychiatric/Behavioral:  Negative for confusion.      PHYSICAL EXAMINATION: ECOG PERFORMANCE STATUS: 0 - Asymptomatic  Vitals:   01/21/24 1043  BP: (!) 154/75  Pulse: 69  Resp: 18  Temp: 97.8 F (36.6 C)   Filed Weights   01/21/24 1043  Weight: 181 lb 14.4 oz (82.5 kg)    Physical Exam Constitutional:      General: He is not in acute distress.    Appearance: He is not diaphoretic.  HENT:     Head: Normocephalic and atraumatic.  Eyes:     General: No scleral icterus. Cardiovascular:     Rate and Rhythm: Normal rate.  Pulmonary:     Effort: Pulmonary effort is normal. No respiratory distress.  Abdominal:     Tenderness: There is no abdominal tenderness.  Musculoskeletal:     Cervical back: Normal range of motion and neck supple.     Comments: Right shoulder limited range of motion.  Skin:    Findings: No erythema or rash.  Neurological:     Mental Status: He is alert and oriented to person, place, and time. Mental status is at baseline.     Motor: No abnormal muscle tone.  Psychiatric:        Mood and Affect: Mood and affect normal.      LABORATORY DATA:  I  have reviewed the data as listed    Latest Ref Rng & Units 12/25/2023    7:44 AM 03/12/2023    1:32 PM 08/11/2019    9:59 AM  CBC  WBC 4.0 - 10.5 K/uL 9.7  8.2  10.6   Hemoglobin 13.0 - 17.0 g/dL 96.2  95.2  84.1   Hematocrit 39.0 - 52.0 % 43.3  45.5  44.9   Platelets 150 - 400 K/uL 194  171  200       Latest Ref Rng & Units 12/25/2023    7:44 AM 03/12/2023    1:32 PM 08/11/2019    9:59 AM  CMP  Glucose 70 - 99 mg/dL 324  401  027   BUN 8 - 23 mg/dL 12  14  11    Creatinine 0.61 - 1.24 mg/dL 2.53  6.64  4.03   Sodium 135 - 145 mmol/L 136  143  138   Potassium 3.5 - 5.1 mmol/L 3.8  3.7  3.6   Chloride 98 - 111 mmol/L 102  102  102   CO2 22 - 32 mmol/L 23  25  26    Calcium 8.9 - 10.3 mg/dL 8.6  9.0  9.0   Total Protein 6.5 - 8.1 g/dL 7.0  7.0    Total Bilirubin 0.0 - 1.2 mg/dL 0.7  0.5    Alkaline Phos 38 - 126 U/L 120  142    AST 15 - 41 U/L 36  22    ALT 0 - 44 U/L 32  24       RADIOGRAPHIC STUDIES: I have personally reviewed the radiological images  as listed and agreed with the findings in the report. MR Brain W Wo Contrast Result Date: 01/20/2024 CLINICAL DATA:  Recent diagnosis of lung cancer. Staging. Balance disturbance. EXAM: MRI HEAD WITHOUT AND WITH CONTRAST TECHNIQUE: Multiplanar, multiecho pulse sequences of the brain and surrounding structures were obtained without and with intravenous contrast. CONTRAST:  8mL GADAVIST GADOBUTROL 1 MMOL/ML IV SOLN COMPARISON:  Head CT 10/28/2020 FINDINGS: Brain: Diffusion imaging does not show any acute or subacute infarction or other cause of restricted diffusion. There are mild chronic small-vessel ischemic changes of the cerebral hemispheric white matter. No cortical or large vessel territory infarction. No evidence of hemorrhage, hydrocephalus or extra-axial collection. After contrast administration, there is a 3 mm focus of enhancement in the left basal ganglia without associated T2, FLAIR or susceptibility abnormality. 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 I would suggest follow-up scan in 6-8 weeks to assess for stability. As stated, I think this is more likely a developmental venous finding. Vascular: Major vessels at the base of the brain show flow. Skull and upper cervical spine: Negative Sinuses/Orbits: Clear/normal Other: Tiny amount of fluid in the mastoid air cells on the right, not likely significant. IMPRESSION: 1. Single 3 mm focus of enhancement in the left basal ganglia without associated T2, FLAIR or susceptibility abnormality. 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 I would suggest follow-up scan in 6-8 weeks to assess for stability. 2. Mild chronic small-vessel ischemic changes of the cerebral hemispheric white matter. Electronically Signed   By: Paulina Fusi M.D.   On: 01/20/2024 16:03   CT SUPER D CHEST WO CONTRAST Result Date: 12/29/2023 CLINICAL DATA:  Left upper lobe pulmonary nodule. Pre EMB and biopsy. EXAM: CT CHEST WITHOUT CONTRAST TECHNIQUE: Multidetector CT imaging of the chest was performed using thin slice collimation for electromagnetic bronchoscopy planning purposes, without intravenous contrast. RADIATION DOSE REDUCTION: This exam was performed according to the departmental dose-optimization program which includes automated exposure control, adjustment of the mA and/or kV according to patient size and/or use of iterative reconstruction technique. COMPARISON:  PET-CT 12/24/2023 FINDINGS: Cardiovascular: The heart is normal in size. No pericardial effusion. The aorta is normal in caliber. Scattered atherosclerotic calcifications. Three-vessel coronary artery calcifications and evidence of prior coronary artery bypass surgery. Mediastinum/Nodes: Scattered mediastinal and hilar lymph nodes are stable. No overt adenopathy. The esophagus is grossly normal.  Lungs/Pleura: Stable 16 mm left upper lobe pulmonary nodule. This was hypermetabolic on the PET-CT and is consistent with a small primary lung neoplasm. No metastatic pulmonary nodules are identified. Stable significant underlying lung disease with emphysema and pulmonary scarring. No pleural effusions or pleural lesions. Upper Abdomen: Stable cirrhotic changes involving the liver. No obvious hepatic lesions. Evidence of portal venous hypertension and portal venous collaterals. Upper abdominal lymph nodes typical with cirrhosis. No adrenal gland lesions are identified. Musculoskeletal: No significant bony findings. Age advanced degenerative changes involving the thoracic spine and cervical spine fusion hardware noted. IMPRESSION: 1. Stable 16 mm left upper lobe pulmonary nodule. This was hypermetabolic on the PET-CT and is consistent with a small primary lung neoplasm. 2. No metastatic pulmonary nodules are identified. 3. Stable significant underlying lung disease with emphysema and pulmonary scarring. 4. Stable cirrhotic changes involving the liver. Evidence of portal venous hypertension and portal venous collaterals. 5. Age advanced degenerative changes involving the thoracic spine and cervical spine fusion hardware  noted. Electronically Signed   By: Rudie Meyer M.D.   On: 12/29/2023 21:25   NM PET Image Initial (PI) Skull Base To Thigh (F-18 FDG) Result Date: 12/29/2023 CLINICAL DATA:  Subsequent treatment strategy for pulmonary nodule. EXAM: NUCLEAR MEDICINE PET SKULL BASE TO THIGH TECHNIQUE: 10.2 mCi F-18 FDG was injected intravenously. Full-ring PET imaging was performed from the skull base to thigh after the radiotracer. CT data was obtained and used for attenuation correction and anatomic localization. Fasting blood glucose: 179 mg/dl COMPARISON:  Chest CT 16/08/9603 FINDINGS: NECK: No hypermetabolic lymph nodes in the neck. Incidental CT findings: None. CHEST: Lobular LEFT upper lobe pulmonary nodule  measures 17 mm (image 52) with SUV max equal 10.5. No additional hypermetabolic or enlarged pulmonary nodules. No hypermetabolic mediastinal lymph nodes. Incidental CT findings: Post CABG ABDOMEN/PELVIS: No abnormal hypermetabolic activity within the liver, pancreas, adrenal glands, or spleen. No hypermetabolic lymph nodes in the abdomen or pelvis. Incidental CT findings: Liver has a nodular contour caudate lobe enlarged. SKELETON: Intense activity associated with the RIGHT shoulder reverse arthroplasty. Intense mint metabolic activity involving the joint space and extending along the stem of the prosthetic Posterior lumbar fusion. Incidental CT findings: None. IMPRESSION: 1. Intense metabolic activity associated with a LEFT upper lobe pulmonary nodule consistent primary bronchogenic carcinoma. 2. No evidence of metastatic adenopathy in the mediastinum. No distant metastatic disease. 3. Intense metabolic activity associated with the RIGHT shoulder versus arthroplasty. Recommend correlation for loosening or infection. 4. Cirrhotic morphology of the liver. 5. Post CABG. Electronically Signed   By: Genevive Bi M.D.   On: 12/29/2023 16:34   DG Chest Port 1 View Result Date: 12/25/2023 CLINICAL DATA:  Post bronchoscopy. EXAM: PORTABLE CHEST 1 VIEW COMPARISON:  CT 12/24/2023 FINDINGS: Subtle haziness surrounding the LEFT upper lobe nodule. No pneumothorax. No pleural fluid. Midline sternotomy. Anterior cervical fusion. IMPRESSION: 1. No pneumothorax following bronchoscopy. 2. Mild haziness around the LEFT upper lobe nodule. Electronically Signed   By: Genevive Bi M.D.   On: 12/25/2023 12:01   DG C-ARM BRONCHOSCOPY Result Date: 12/25/2023 C-ARM BRONCHOSCOPY: Fluoroscopy was utilized by the requesting physician.  No radiographic interpretation.

## 2024-01-21 NOTE — Assessment & Plan Note (Signed)
 There is intense activity on PET scan, concerning for infection or inflammation recommend patient to contact orthopedic surgeon for further evaluation.

## 2024-01-22 NOTE — Progress Notes (Signed)
 Order for [employer], xR, and PDL1 submitted on sample [id number]. Financial assistance application will be completed at next clinic visit.

## 2024-01-26 ENCOUNTER — Other Ambulatory Visit: Payer: Self-pay | Admitting: Oncology

## 2024-01-26 ENCOUNTER — Ambulatory Visit
Admission: RE | Admit: 2024-01-26 | Discharge: 2024-01-26 | Disposition: A | Discharge status: Home / Self Care | Payer: Medicare HMO | Source: Ambulatory Visit | Attending: Radiation Oncology | Admitting: Radiation Oncology

## 2024-01-26 ENCOUNTER — Encounter: Payer: Self-pay | Admitting: *Deleted

## 2024-01-26 DIAGNOSIS — Z510 Encounter for antineoplastic radiation therapy: Secondary | ICD-10-CM | POA: Diagnosis not present

## 2024-01-26 DIAGNOSIS — C3412 Malignant neoplasm of upper lobe, left bronchus or lung: Secondary | ICD-10-CM | POA: Insufficient documentation

## 2024-01-26 DIAGNOSIS — F17210 Nicotine dependence, cigarettes, uncomplicated: Secondary | ICD-10-CM | POA: Insufficient documentation

## 2024-01-26 DIAGNOSIS — C7A1 Malignant poorly differentiated neuroendocrine tumors: Secondary | ICD-10-CM

## 2024-01-26 LAB — FUNGUS CULTURE WITH STAIN

## 2024-01-26 LAB — FUNGAL ORGANISM REFLEX

## 2024-01-26 LAB — FUNGUS CULTURE RESULT

## 2024-01-26 MED ORDER — DEXAMETHASONE 4 MG PO TABS: ORAL_TABLET | ORAL | 1 refills | Status: AC

## 2024-01-26 MED ORDER — ONDANSETRON HCL 8 MG PO TABS: 8.0000 mg | ORAL_TABLET | Freq: Three times a day (TID) | ORAL | 1 refills | Status: AC | PRN

## 2024-01-26 MED ORDER — PROCHLORPERAZINE MALEATE 10 MG PO TABS: 10.0000 mg | ORAL_TABLET | Freq: Four times a day (QID) | ORAL | 1 refills | Status: DC | PRN

## 2024-01-26 NOTE — Progress Notes (Signed)
 DISCONTINUE ON PATHWAY REGIMEN - Non-Small Cell Lung  No Medical Intervention - Off Treatment.  PRIOR TREATMENT: [id number]: Referral to Radiation Oncology  START OFF PATHWAY REGIMEN - Non-Small Cell Lung   [id number]:Carboplatin AUC=5 IV D1 + Etoposide 100 mg/m2 IV D1-3 q21 Days:   A cycle is every 21 days:     Carboplatin      Etoposide   **Always confirm dose/schedule in your pharmacy ordering system**  Patient Characteristics: Preoperative or Nonsurgical Candidate (Clinical Staging), Stage IA, Nonsurgical Candidate Therapeutic Status: Preoperative or Nonsurgical Candidate (Clinical Staging) AJCC T Category: cT1b AJCC N Category: cN0 AJCC M Category: cM0 AJCC 9 Stage Grouping: IA2 Check here if patient was staged using an edition other than AJCC Staging 9th Edition: false Intent of Therapy: Curative Intent, Discussed with Patient

## 2024-01-26 NOTE — Progress Notes (Signed)
 Met with patient and his daughter during CT simulation appt. All questions answered during visit. Pt stated that he would like to try chemotherapy. Dr. Rushie Chestnut recommends to start chemo after radiation is complete. Dr. Cathie Hoops made aware and will enter treatment plan for tentative chemo start date on 4/15. Informed pt that will schedule him for chemo education prior to starting chemo and he will be given that appt once scheduled. Nothing further needed at this time.  Financial assistance application for [employer] completed during visit. Approved for $0 out-of-pocket expense.

## 2024-01-26 NOTE — Progress Notes (Signed)
 Non-Small Cell Lung - No Medical Intervention - Off Treatment.  Patient Characteristics: Preoperative or Nonsurgical Candidate (Clinical Staging), Stage IA, Nonsurgical Candidate Therapeutic Status: Preoperative or Nonsurgical Candidate (Clinical Staging) AJCC T Category: cT1b AJCC N Category: cN0 AJCC M Category: cM0 AJCC 9 Stage Grouping: IA2 Check here if patient was staged using an edition other than AJCC Staging 9th Edition: false

## 2024-01-27 ENCOUNTER — Other Ambulatory Visit: Payer: Self-pay

## 2024-01-27 DIAGNOSIS — M25511 Pain in right shoulder: Secondary | ICD-10-CM | POA: Diagnosis not present

## 2024-01-28 ENCOUNTER — Inpatient Hospital Stay: Payer: Medicare HMO | Attending: Oncology

## 2024-01-28 DIAGNOSIS — C7A1 Malignant poorly differentiated neuroendocrine tumors: Secondary | ICD-10-CM

## 2024-01-28 DIAGNOSIS — C3412 Malignant neoplasm of upper lobe, left bronchus or lung: Secondary | ICD-10-CM | POA: Diagnosis not present

## 2024-01-28 DIAGNOSIS — F17210 Nicotine dependence, cigarettes, uncomplicated: Secondary | ICD-10-CM | POA: Diagnosis not present

## 2024-01-28 DIAGNOSIS — C3490 Malignant neoplasm of unspecified part of unspecified bronchus or lung: Secondary | ICD-10-CM | POA: Diagnosis not present

## 2024-01-28 DIAGNOSIS — Z510 Encounter for antineoplastic radiation therapy: Secondary | ICD-10-CM | POA: Diagnosis not present

## 2024-01-28 NOTE — Progress Notes (Signed)
 Multidisciplinary Oncology Council Documentation  KYREN KNICK was presented by our [facility] on 01/28/2024, which included representatives from:  Palliative Care Dietitian  Physical/Occupational Therapist Nurse Navigator Genetics Social work Survivorship RN Financial Navigator Research RN   Colleen currently presents with history of lung cancer  We reviewed previous medical and familial history, history of present illness, and recent lab results along with all available histopathologic and imaging studies. The MOC considered available treatment options and made the following recommendations/referrals:  Nutrition, SW  The MOC is a meeting of clinicians from various specialty areas who evaluate and discuss patients for whom a multidisciplinary approach is being considered. Final determinations in the plan of care are those of the provider(s).   Today's extended care, comprehensive team conference, Kofi was not present for the discussion and was not examined.

## 2024-01-29 ENCOUNTER — Inpatient Hospital Stay

## 2024-01-29 ENCOUNTER — Telehealth: Payer: Self-pay

## 2024-01-29 DIAGNOSIS — K7460 Unspecified cirrhosis of liver: Secondary | ICD-10-CM

## 2024-01-29 NOTE — Telephone Encounter (Signed)
-----   Message from [facility] [place] [facility] sent at 09/29/2023  8:50 AM EST ----- Recommend repeat ultrasound liver in 6 months for [facility] screening

## 2024-01-29 NOTE — Telephone Encounter (Signed)
 Called and made follow up appointment for 04/07/2024 with Dr. Allegra Lai and informed him he would need to do the Ultrasound before his appointment. He stated that was fine. Called and got patient schedule for 03/29/2024 arrive 8:15am at [facility] for at 8:30am scan.  Nothing to eat or drink after midnight. Mailed appointment reminders

## 2024-01-29 NOTE — Progress Notes (Signed)
 [facility] Clinical Social Work  Initial Assessment   [name] is a 68 y.o. year old male contacted by phone. Clinical Social Work was referred by  [facility]  for assessment of psychosocial needs.   SDOH (Social Determinants of Health) assessments performed: Yes   SDOH Screenings   Food Insecurity: No Food Insecurity (01/06/2024)  Housing: Unknown (01/06/2024)  Transportation Needs: No Transportation Needs (01/06/2024)  Utilities: Not At Risk (01/06/2024)  Depression (PHQ2-9): Low Risk  (01/06/2024)  Stress: No Stress Concern Present (01/06/2024)  Tobacco Use: High Risk (01/21/2024)  Health Literacy: Adequate Health Literacy (01/06/2024)     Distress Screen completed: No     No data to display            Family/Social Information:  Housing Arrangement: patient lives with his daughter, Amil Amen. Family members/support persons in your life? Family.  Patient has two daughters and a son.  Patient is in the process of getting a divorce. Transportation concerns: no  Employment: Disabled for 10-15 years. Income source: Social Security Disability Financial concerns: No Type of concern: None Food access concerns: no Religious or spiritual practice: Yes-Patient is of [employer]. Advanced directives: No Services Currently in place:  Medicare.  Coping/ Adjustment to diagnosis: Patient understands treatment plan and what happens next? yes Concerns about diagnosis and/or treatment: I'm not especially worried about anything Patient reported stressors:  None. Hopes and/or priorities: Family Patient enjoys gardening and fishing Current coping skills/ strengths: Active sense of humor , Average or above average intelligence , Capable of independent living , Manufacturing systems engineer , Contractor , General fund of knowledge , Motivation for treatment/growth , and Supportive family/friends     SUMMARY: Current SDOH Barriers:  None identified.  Clinical Social Work Clinical Goal(s):  No  clinical social work goals at this time  Interventions: Discussed common feeling and emotions when being diagnosed with cancer, and the importance of support during treatment Informed patient of the support team roles and support services at [facility] Provided CSW contact information and encouraged patient to call with any questions or concerns Provided patient with information about CSW role and the [employer].  Patient stated he has no needs at this time.   Follow Up Plan: Patient will contact CSW with any support or resource needs Patient verbalizes understanding of plan: Yes    Dorothey Baseman, LCSW Clinical Social Worker [facility]

## 2024-01-30 ENCOUNTER — Other Ambulatory Visit: Payer: Self-pay

## 2024-02-01 DIAGNOSIS — C3490 Malignant neoplasm of unspecified part of unspecified bronchus or lung: Secondary | ICD-10-CM | POA: Diagnosis not present

## 2024-02-03 ENCOUNTER — Other Ambulatory Visit: Payer: Self-pay

## 2024-02-03 ENCOUNTER — Encounter: Payer: Self-pay | Admitting: *Deleted

## 2024-02-03 ENCOUNTER — Ambulatory Visit
Admission: RE | Admit: 2024-02-03 | Discharge: 2024-02-03 | Disposition: A | Discharge status: Home / Self Care | Payer: Medicare HMO | Source: Ambulatory Visit | Attending: Radiation Oncology | Admitting: Radiation Oncology

## 2024-02-03 DIAGNOSIS — F17210 Nicotine dependence, cigarettes, uncomplicated: Secondary | ICD-10-CM | POA: Diagnosis not present

## 2024-02-03 DIAGNOSIS — Z510 Encounter for antineoplastic radiation therapy: Secondary | ICD-10-CM | POA: Diagnosis not present

## 2024-02-03 DIAGNOSIS — C3412 Malignant neoplasm of upper lobe, left bronchus or lung: Secondary | ICD-10-CM | POA: Diagnosis not present

## 2024-02-03 LAB — RAD ONC ARIA SESSION SUMMARY
Course Elapsed Days: 0
Plan Fractions Treated to Date: 1
Plan Prescribed Dose Per Fraction: 12 Gy
Plan Total Fractions Prescribed: 5
Plan Total Prescribed Dose: 60 Gy
Reference Point Dosage Given to Date: 12 Gy
Reference Point Session Dosage Given: 12 Gy
Session Number: 1

## 2024-02-05 ENCOUNTER — Ambulatory Visit
Admission: RE | Admit: 2024-02-05 | Discharge: 2024-02-05 | Disposition: A | Discharge status: Home / Self Care | Payer: Medicare HMO | Source: Ambulatory Visit | Attending: Radiation Oncology | Admitting: Radiation Oncology

## 2024-02-05 ENCOUNTER — Other Ambulatory Visit: Payer: Self-pay

## 2024-02-05 DIAGNOSIS — Z510 Encounter for antineoplastic radiation therapy: Secondary | ICD-10-CM | POA: Diagnosis not present

## 2024-02-05 DIAGNOSIS — F17210 Nicotine dependence, cigarettes, uncomplicated: Secondary | ICD-10-CM | POA: Diagnosis not present

## 2024-02-05 DIAGNOSIS — C3412 Malignant neoplasm of upper lobe, left bronchus or lung: Secondary | ICD-10-CM | POA: Diagnosis not present

## 2024-02-05 DIAGNOSIS — C3490 Malignant neoplasm of unspecified part of unspecified bronchus or lung: Secondary | ICD-10-CM | POA: Diagnosis not present

## 2024-02-05 LAB — RAD ONC ARIA SESSION SUMMARY
Course Elapsed Days: 2
Plan Fractions Treated to Date: 2
Plan Prescribed Dose Per Fraction: 12 Gy
Plan Total Fractions Prescribed: 5
Plan Total Prescribed Dose: 60 Gy
Reference Point Dosage Given to Date: 24 Gy
Reference Point Session Dosage Given: 12 Gy
Session Number: 2

## 2024-02-07 LAB — ACID FAST CULTURE WITH REFLEXED SENSITIVITIES (MYCOBACTERIA): Acid Fast Culture: NEGATIVE

## 2024-02-10 ENCOUNTER — Ambulatory Visit
Admission: RE | Admit: 2024-02-10 | Discharge: 2024-02-10 | Disposition: A | Discharge status: Home / Self Care | Payer: Medicare HMO | Source: Ambulatory Visit | Attending: Radiation Oncology | Admitting: Radiation Oncology

## 2024-02-10 ENCOUNTER — Other Ambulatory Visit: Payer: Self-pay

## 2024-02-10 DIAGNOSIS — Z510 Encounter for antineoplastic radiation therapy: Secondary | ICD-10-CM | POA: Diagnosis not present

## 2024-02-10 DIAGNOSIS — F17210 Nicotine dependence, cigarettes, uncomplicated: Secondary | ICD-10-CM | POA: Diagnosis not present

## 2024-02-10 DIAGNOSIS — C3490 Malignant neoplasm of unspecified part of unspecified bronchus or lung: Secondary | ICD-10-CM | POA: Diagnosis not present

## 2024-02-10 DIAGNOSIS — C3412 Malignant neoplasm of upper lobe, left bronchus or lung: Secondary | ICD-10-CM | POA: Diagnosis not present

## 2024-02-10 LAB — RAD ONC ARIA SESSION SUMMARY
Course Elapsed Days: 7
Plan Fractions Treated to Date: 3
Plan Prescribed Dose Per Fraction: 12 Gy
Plan Total Fractions Prescribed: 5
Plan Total Prescribed Dose: 60 Gy
Reference Point Dosage Given to Date: 36 Gy
Reference Point Session Dosage Given: 12 Gy
Session Number: 3

## 2024-02-12 ENCOUNTER — Other Ambulatory Visit: Payer: Self-pay

## 2024-02-12 ENCOUNTER — Ambulatory Visit
Admission: RE | Admit: 2024-02-12 | Discharge: 2024-02-12 | Disposition: A | Discharge status: Home / Self Care | Payer: Medicare HMO | Source: Ambulatory Visit | Attending: Radiation Oncology | Admitting: Radiation Oncology

## 2024-02-12 DIAGNOSIS — C3412 Malignant neoplasm of upper lobe, left bronchus or lung: Secondary | ICD-10-CM | POA: Diagnosis not present

## 2024-02-12 DIAGNOSIS — Z510 Encounter for antineoplastic radiation therapy: Secondary | ICD-10-CM | POA: Diagnosis not present

## 2024-02-12 DIAGNOSIS — F17210 Nicotine dependence, cigarettes, uncomplicated: Secondary | ICD-10-CM | POA: Diagnosis not present

## 2024-02-12 LAB — RAD ONC ARIA SESSION SUMMARY
Course Elapsed Days: 9
Plan Fractions Treated to Date: 4
Plan Prescribed Dose Per Fraction: 12 Gy
Plan Total Fractions Prescribed: 5
Plan Total Prescribed Dose: 60 Gy
Reference Point Dosage Given to Date: 48 Gy
Reference Point Session Dosage Given: 12 Gy
Session Number: 4

## 2024-02-17 ENCOUNTER — Other Ambulatory Visit: Payer: Self-pay

## 2024-02-17 ENCOUNTER — Ambulatory Visit
Admission: RE | Admit: 2024-02-17 | Discharge: 2024-02-17 | Disposition: A | Discharge status: Home / Self Care | Payer: Medicare HMO | Source: Ambulatory Visit | Attending: Radiation Oncology | Admitting: Radiation Oncology

## 2024-02-17 DIAGNOSIS — C3412 Malignant neoplasm of upper lobe, left bronchus or lung: Secondary | ICD-10-CM | POA: Diagnosis not present

## 2024-02-17 DIAGNOSIS — Z510 Encounter for antineoplastic radiation therapy: Secondary | ICD-10-CM | POA: Diagnosis not present

## 2024-02-17 DIAGNOSIS — F17210 Nicotine dependence, cigarettes, uncomplicated: Secondary | ICD-10-CM | POA: Diagnosis not present

## 2024-02-17 LAB — RAD ONC ARIA SESSION SUMMARY
Course Elapsed Days: 14
Plan Fractions Treated to Date: 5
Plan Prescribed Dose Per Fraction: 12 Gy
Plan Total Fractions Prescribed: 5
Plan Total Prescribed Dose: 60 Gy
Reference Point Dosage Given to Date: 60 Gy
Reference Point Session Dosage Given: 12 Gy
Session Number: 5

## 2024-02-18 NOTE — Radiation Completion Notes (Signed)
 Patient Name: [name], [name] MRN: [id number] Date of Birth: [birth year] Referring Physician: Brynda Greathouse, M.D. Date of Service: 2024-02-18 Radiation Oncologist: Carmina Miller, M.D. 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  PET scan showed intense metabolic activity associated with left upper lobe pulmonary nodule consistent with primary bronchogenic carcinoma.  There is no evidence of adenopathy mediastinum or distant metastatic disease.  He did have intense metabolic activity associate his right shoulder for which she will be consulted by orthopedic surgery most likely nonmalignant.  Pulmonary MRI there is a single 3 mm focus of enhancement left basal ganglia without associated T2 flair favored to represent a venous anomaly.  Biopsy of left upper lobe pulmonary nodule was positive for malignant cells tumor was classified as a non-small cell lung cancer.  He has been seen by surgery and declined based on pulmonary functions for surgical resection.  He is seen today for radiation oncology evaluation he is doing fairly well does have some moderate dyspnea on exertion no significant hemoptysis no cough.      ==========DELIVERED PLANS==========  First Treatment Date: 2024-02-03 Last Treatment Date: 2024-02-17   Plan Name: Lung_L_SBRT Site: Lung, Left Technique: SBRT/SRT-IMRT Mode: Photon Dose Per Fraction: 12 Gy Prescribed Dose (Delivered / Prescribed): 60 Gy / 60 Gy Prescribed Fxs (Delivered / Prescribed): 5 / 5     ==========ON TREATMENT VISIT DATES========== 2024-02-03,  2024-02-05, 2024-02-10, 2024-02-10, 2024-02-12, 2024-02-17     ==========UPCOMING VISITS==========       ==========APPENDIX - ON TREATMENT VISIT NOTES==========   See weekly On Treatment Notes in Epic for details in the Media tab (listed as Progress notes on the On Treatment Visit Dates listed above).

## 2024-02-23 ENCOUNTER — Ambulatory Visit: Admitting: Radiation Oncology

## 2024-02-24 NOTE — Progress Notes (Signed)
 Pharmacist Chemotherapy Monitoring - Initial Assessment    Anticipated start date: 03/09/24   The following has been reviewed per standard work regarding the patient's treatment regimen: The patient's diagnosis, treatment plan and drug doses, and organ/hematologic function Lab orders and baseline tests specific to treatment regimen  The treatment plan start date, drug sequencing, and pre-medications Prior authorization status  Patient's documented medication list, including drug-drug interaction screen and prescriptions for anti-emetics and supportive care specific to the treatment regimen The drug concentrations, fluid compatibility, administration routes, and timing of the medications to be used The patient's access for treatment and lifetime cumulative dose history, if applicable  The patient's medication allergies and previous infusion related reactions, if applicable   Changes made to treatment plan:  N/A  Follow up needed:  N/A   Sharen Hones, PharmD, BCPS Clinical Pharmacist   02/24/2024  2:59 PM

## 2024-02-25 DIAGNOSIS — F324 Major depressive disorder, single episode, in partial remission: Secondary | ICD-10-CM | POA: Diagnosis not present

## 2024-02-25 DIAGNOSIS — M542 Cervicalgia: Secondary | ICD-10-CM | POA: Diagnosis not present

## 2024-02-25 DIAGNOSIS — K7460 Unspecified cirrhosis of liver: Secondary | ICD-10-CM | POA: Diagnosis not present

## 2024-02-25 DIAGNOSIS — L03011 Cellulitis of right finger: Secondary | ICD-10-CM | POA: Diagnosis not present

## 2024-02-25 DIAGNOSIS — G8929 Other chronic pain: Secondary | ICD-10-CM | POA: Diagnosis not present

## 2024-02-25 DIAGNOSIS — G629 Polyneuropathy, unspecified: Secondary | ICD-10-CM | POA: Diagnosis not present

## 2024-02-25 DIAGNOSIS — E7800 Pure hypercholesterolemia, unspecified: Secondary | ICD-10-CM | POA: Diagnosis not present

## 2024-02-25 DIAGNOSIS — I2510 Atherosclerotic heart disease of native coronary artery without angina pectoris: Secondary | ICD-10-CM | POA: Diagnosis not present

## 2024-02-25 DIAGNOSIS — E1169 Type 2 diabetes mellitus with other specified complication: Secondary | ICD-10-CM | POA: Diagnosis not present

## 2024-02-25 DIAGNOSIS — I10 Essential (primary) hypertension: Secondary | ICD-10-CM | POA: Diagnosis not present

## 2024-02-25 DIAGNOSIS — C7A1 Malignant poorly differentiated neuroendocrine tumors: Secondary | ICD-10-CM | POA: Diagnosis not present

## 2024-02-25 DIAGNOSIS — J309 Allergic rhinitis, unspecified: Secondary | ICD-10-CM | POA: Diagnosis not present

## 2024-03-01 ENCOUNTER — Inpatient Hospital Stay: Attending: Oncology

## 2024-03-01 DIAGNOSIS — C7A1 Malignant poorly differentiated neuroendocrine tumors: Secondary | ICD-10-CM | POA: Insufficient documentation

## 2024-03-01 DIAGNOSIS — M25511 Pain in right shoulder: Secondary | ICD-10-CM | POA: Insufficient documentation

## 2024-03-01 DIAGNOSIS — Z5111 Encounter for antineoplastic chemotherapy: Secondary | ICD-10-CM | POA: Insufficient documentation

## 2024-03-01 DIAGNOSIS — F17210 Nicotine dependence, cigarettes, uncomplicated: Secondary | ICD-10-CM | POA: Insufficient documentation

## 2024-03-01 DIAGNOSIS — Z5189 Encounter for other specified aftercare: Secondary | ICD-10-CM | POA: Insufficient documentation

## 2024-03-01 DIAGNOSIS — G4700 Insomnia, unspecified: Secondary | ICD-10-CM | POA: Insufficient documentation

## 2024-03-01 DIAGNOSIS — K7469 Other cirrhosis of liver: Secondary | ICD-10-CM | POA: Insufficient documentation

## 2024-03-01 DIAGNOSIS — Z7982 Long term (current) use of aspirin: Secondary | ICD-10-CM | POA: Insufficient documentation

## 2024-03-01 DIAGNOSIS — Z923 Personal history of irradiation: Secondary | ICD-10-CM | POA: Insufficient documentation

## 2024-03-01 DIAGNOSIS — E1165 Type 2 diabetes mellitus with hyperglycemia: Secondary | ICD-10-CM | POA: Insufficient documentation

## 2024-03-03 ENCOUNTER — Ambulatory Visit
Admission: RE | Admit: 2024-03-03 | Discharge: 2024-03-03 | Disposition: A | Discharge status: Home / Self Care | Payer: Medicare HMO | Source: Ambulatory Visit | Attending: Oncology | Admitting: Oncology

## 2024-03-03 ENCOUNTER — Encounter: Payer: Self-pay | Admitting: Oncology

## 2024-03-03 DIAGNOSIS — G939 Disorder of brain, unspecified: Secondary | ICD-10-CM | POA: Insufficient documentation

## 2024-03-03 DIAGNOSIS — I6782 Cerebral ischemia: Secondary | ICD-10-CM | POA: Diagnosis not present

## 2024-03-03 MED ORDER — GADOBUTROL 1 MMOL/ML IV SOLN
7.5000 mL | Freq: Once | INTRAVENOUS | Status: AC | PRN
  Administered 2024-03-03: 7.5 mL via INTRAVENOUS

## 2024-03-08 MED FILL — Fosaprepitant Dimeglumine For IV Infusion 150 MG (Base Eq): INTRAVENOUS | Qty: 5 | Status: AC

## 2024-03-09 ENCOUNTER — Inpatient Hospital Stay

## 2024-03-09 ENCOUNTER — Encounter: Payer: Self-pay | Admitting: Oncology

## 2024-03-09 ENCOUNTER — Inpatient Hospital Stay: Admitting: Oncology

## 2024-03-09 VITALS — BP 129/53 | HR 56 | Temp 95.4°F | Resp 18

## 2024-03-09 VITALS — BP 137/76 | HR 66 | Temp 98.1°F | Resp 19 | Wt 183.5 lb

## 2024-03-09 DIAGNOSIS — F17210 Nicotine dependence, cigarettes, uncomplicated: Secondary | ICD-10-CM | POA: Diagnosis not present

## 2024-03-09 DIAGNOSIS — C7A1 Malignant poorly differentiated neuroendocrine tumors: Secondary | ICD-10-CM

## 2024-03-09 DIAGNOSIS — M25511 Pain in right shoulder: Secondary | ICD-10-CM | POA: Diagnosis not present

## 2024-03-09 DIAGNOSIS — Z923 Personal history of irradiation: Secondary | ICD-10-CM | POA: Diagnosis not present

## 2024-03-09 DIAGNOSIS — K7469 Other cirrhosis of liver: Secondary | ICD-10-CM

## 2024-03-09 DIAGNOSIS — Z7982 Long term (current) use of aspirin: Secondary | ICD-10-CM | POA: Diagnosis not present

## 2024-03-09 DIAGNOSIS — G8929 Other chronic pain: Secondary | ICD-10-CM

## 2024-03-09 DIAGNOSIS — E1165 Type 2 diabetes mellitus with hyperglycemia: Secondary | ICD-10-CM | POA: Diagnosis not present

## 2024-03-09 DIAGNOSIS — Z5111 Encounter for antineoplastic chemotherapy: Secondary | ICD-10-CM | POA: Diagnosis not present

## 2024-03-09 DIAGNOSIS — Z5189 Encounter for other specified aftercare: Secondary | ICD-10-CM | POA: Diagnosis not present

## 2024-03-09 DIAGNOSIS — G4700 Insomnia, unspecified: Secondary | ICD-10-CM | POA: Diagnosis not present

## 2024-03-09 LAB — CBC WITH DIFFERENTIAL (CANCER CENTER ONLY)
Abs Immature Granulocytes: 0.02 10*3/uL (ref 0.00–0.07)
Basophils Absolute: 0.1 10*3/uL (ref 0.0–0.1)
Basophils Relative: 1 %
Eosinophils Absolute: 0.3 10*3/uL (ref 0.0–0.5)
Eosinophils Relative: 5 %
HCT: 44.2 % (ref 39.0–52.0)
Hemoglobin: 14.8 g/dL (ref 13.0–17.0)
Immature Granulocytes: 0 %
Lymphocytes Relative: 18 %
Lymphs Abs: 1.3 10*3/uL (ref 0.7–4.0)
MCH: 31.9 pg (ref 26.0–34.0)
MCHC: 33.5 g/dL (ref 30.0–36.0)
MCV: 95.3 fL (ref 80.0–100.0)
Monocytes Absolute: 0.4 10*3/uL (ref 0.1–1.0)
Monocytes Relative: 5 %
Neutro Abs: 5 10*3/uL (ref 1.7–7.7)
Neutrophils Relative %: 71 %
Platelet Count: 150 10*3/uL (ref 150–400)
RBC: 4.64 MIL/uL (ref 4.22–5.81)
RDW: 13 % (ref 11.5–15.5)
WBC Count: 7.1 10*3/uL (ref 4.0–10.5)
nRBC: 0 % (ref 0.0–0.2)

## 2024-03-09 LAB — CMP (CANCER CENTER ONLY)
ALT: 24 U/L (ref 0–44)
AST: 24 U/L (ref 15–41)
Albumin: 3.9 g/dL (ref 3.5–5.0)
Alkaline Phosphatase: 111 U/L (ref 38–126)
Anion gap: 10 (ref 5–15)
BUN: 21 mg/dL (ref 8–23)
CO2: 25 mmol/L (ref 22–32)
Calcium: 8.6 mg/dL — ABNORMAL LOW (ref 8.9–10.3)
Chloride: 100 mmol/L (ref 98–111)
Creatinine: 1.08 mg/dL (ref 0.61–1.24)
GFR, Estimated: >60 mL/min (ref >60)
Glucose, Bld: 244 mg/dL — ABNORMAL HIGH (ref 70–99)
Potassium: 3.7 mmol/L (ref 3.5–5.1)
Sodium: 135 mmol/L (ref 135–145)
Total Bilirubin: 1 mg/dL (ref 0.0–1.2)
Total Protein: 7.2 g/dL (ref 6.5–8.1)

## 2024-03-09 MED ORDER — SODIUM CHLORIDE 0.9 % IV SOLN
100.0000 mg/m2 | Freq: Once | INTRAVENOUS | Status: AC
  Administered 2024-03-09: 200 mg via INTRAVENOUS
  Filled 2024-03-09: qty 10

## 2024-03-09 MED ORDER — SODIUM CHLORIDE 0.9 % IV SOLN
150.0000 mg | Freq: Once | INTRAVENOUS | Status: AC
  Administered 2024-03-09: 150 mg via INTRAVENOUS
  Filled 2024-03-09: qty 150

## 2024-03-09 MED ORDER — PALONOSETRON HCL INJECTION 0.25 MG/5ML
0.2500 mg | Freq: Once | INTRAVENOUS | Status: AC
  Administered 2024-03-09: 0.25 mg via INTRAVENOUS

## 2024-03-09 MED ORDER — SODIUM CHLORIDE 0.9 % IV SOLN
INTRAVENOUS | Status: DC
  Filled 2024-03-09: qty 250

## 2024-03-09 MED ORDER — DEXAMETHASONE SODIUM PHOSPHATE 10 MG/ML IJ SOLN
10.0000 mg | Freq: Once | INTRAMUSCULAR | Status: AC
  Administered 2024-03-09: 10 mg via INTRAVENOUS

## 2024-03-09 MED ORDER — SODIUM CHLORIDE 0.9 % IV SOLN
510.5000 mg | Freq: Once | INTRAVENOUS | Status: AC
  Administered 2024-03-09: 510.5 mg via INTRAVENOUS
  Filled 2024-03-09: qty 50.79

## 2024-03-09 NOTE — Progress Notes (Signed)
 Nutrition Assessment   Reason for Assessment:  New chemotherapy   ASSESSMENT:   68 year old male with lung cancer.  Past medical history of CHF, GERD, CAD, DM, heart failure, HLD, HTN, PVD.  S/p SBRT.  Planning to start carboplatin and etoposide today.   Met with patient during infusion.  Reports normal/good appetite.  Usually has biscuit and coffee for breakfast.  Lunch he eats usually 3-4 times a week (usually leftovers tacos, spaghetti, steak).  Dinner is usually meat and couple of sides.  Has ensure shakes at home and has tried them but does not like them too much.     Medications: decadron, compazine, zofran, prilosec   Labs: glucose 244   Anthropometrics:   Height: 71 inches Weight: 183 lb 8 oz 175-180 lb UBW  BMI: 25  Weight increased   NUTRITION DIAGNOSIS: Food and nutrition related knowledge deficit related to cancer and starting treatment as evidenced by treatment side effects could potentially impact nutrition   MALNUTRITION DIAGNOSIS: none at this time   INTERVENTION:  Discussed importance of nutrition during treatment.  Handout provided regarding nutrition during treatment Discussed ways to flavor ensure/boost shakes to improve taste Contact information provided   MONITORING, EVALUATION, GOAL: weight trends, intake   Next Visit: Tuesday, May 6 during infusion  Holden Draughon B. Zollie Hipp, CSO, LDN Registered Dietitian [phone]

## 2024-03-09 NOTE — Assessment & Plan Note (Signed)
 There is intense activity on PET scan, concerning for infection or inflammation recommend patient he was seen by orthopedic surgeon and his xray showed shoulder replacement with probable loosening. Orthopedic surgeon does not feel that he has infection of shoulder.

## 2024-03-09 NOTE — Assessment & Plan Note (Addendum)
 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 Not a surgical candidate.  S/p SBRT.  Recommend adjuvant carboplatin and Etoposide for 4 cycles. .  Labs are reviewed and discussed with patient. Proceed with cycle 1 D1carboplatin and D1-3 Etoposide with GCSF on D5. Recommend claritin 10mg  daily x 4 days.  We discussed about instructions of antiemetics and Dexamethasone.   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  He is on Aspirin 325mg  daily and Statin.

## 2024-03-09 NOTE — Assessment & Plan Note (Signed)
 Chemotherapy plan as listed above

## 2024-03-09 NOTE — Assessment & Plan Note (Signed)
 Compensated.  Recommend patient continue follow-up with GI

## 2024-03-09 NOTE — Progress Notes (Signed)
 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 Not a surgical candidate.  S/p SBRT.  Recommend adjuvant carboplatin and Etoposide for 4 cycles. .  Labs are reviewed and discussed with patient. Proceed with cycle 1 D1carboplatin and D1-3 Etoposide with GCSF on D5. Recommend claritin 10mg  daily x 4 days.  We discussed about instructions of antiemetics and Dexamethasone.   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  He is on Aspirin 325mg  daily and Statin.    Other cirrhosis of liver (HCC) Compensated.  Recommend patient continue follow-up with GI  Right shoulder pain There is intense activity on PET scan, concerning for infection or inflammation recommend patient he was seen by orthopedic surgeon and his xray showed shoulder replacement with probable loosening. Orthopedic surgeon does not feel that he has infection of shoulder.   Encounter for antineoplastic chemotherapy Chemotherapy plan as listed above   Orders Placed This Encounter  Procedures   CBC with Differential (Cancer Center Only)    Standing Status:   Future    Expected Date:   03/16/2024    Expiration Date:   03/09/2025   CMP (Cancer Center only)    Standing Status:   Future    Expected Date:   03/16/2024    Expiration Date:   03/09/2025   CBC with Differential (Cancer Center Only)    Standing Status:   Future    Expected Date:   03/30/2024    Expiration Date:   03/30/2025   CMP (Cancer Center only)    Standing Status:   Future    Expected Date:   03/30/2024    Expiration Date:   03/30/2025   CBC with Differential (Cancer Center Only)    Standing Status:   Future    Expected Date:    03/16/2024    Expiration Date:   03/09/2025   CMP (Cancer Center only)    Standing Status:   Future    Expected Date:   03/16/2024    Expiration Date:   03/09/2025   Follow-up 1 week  All questions were answered. The patient knows to call the clinic with any problems, questions or concerns.  Rickard Patience, MD, PhD [facility] Hematology Oncology 03/09/2024    HISTORY OF PRESENTING ILLNESS:  [name] 68 y.o. 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 I have reviewed his chart and materials related to his cancer extensively and collaborated history with the patient. 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 Enlarging microlobulated lingular nodule, now 15.9 mm in size. Lung-RADS 4B, suspicious. Additional imaging evaluation or consultation with Pulmonology or Thoracic Surgery recommended. These results will be called to the ordering clinician or representative by the Radiologist Assistant, and communication documented in the PACS or [employer]. 2. New 4.4 cm lingular nodule.  Recommend attention on follow-up. 3. Cirrhosis. 4.  Aortic atherosclerosis (ICD10-I70.0). 5.  Emphysema    01/06/2024 Initial Diagnosis   High grade neuroendocrine carcinoma of lung   Patient was evaluated by pulmonology and underwent biopsy via bronchoscopy. A.  LEFT  LUNG, UPPER LOBE, NODULE, FINE NEEDLE ASPIRATION  BIOPSY:  - Malignant  - Non-small cell carcinoma (see comment)   B.  LEFT LUNG, UPPER LOBE, NODULE, BRUSHING:  - Malignant  - Non-small cell carcinoma (see comment)   ADDENDUM 1 :  Immunohistochemistry is performed to classify the carcinoma.  The tumor cells are negative with TTF-1 and p40.  Additional  immunohistochemistry will be performed and reported in a subsequent  addendum.   ADDENDUM 2: Additional  immunohistochemistry is performed to better  characterize the carcinoma and the malignant cells are positive with  synaptophysin and negative with cytokeratin 5/6, CD56 and chromogranin.  Ki-67 shows a moderately increased proliferation rate.  The presence of  synaptophysin positivity is most consistent with intermediate to  high-grade neuroendocrine carcinoma   -- I discussed with pathologist and confirmed the large cell neuroendocrine carcinoma histology.  D. LYMPH NODE, STATION 7, FINE NEEDLE ASPIRATION:  - Negative for malignancy  - Compatible with a benign lymph node   E. LYMPH NODE, STATION 11L, FINE NEEDLE ASPIRATION:  - Negative for malignancy  - Compatible with a benign lymph node   F. LUNG, LUL, LAVAGE:   FINAL MICROSCOPIC DIAGNOSIS:  - No malignant cells identified  - Pulmonary macrophages and benign bronchial cells    01/06/2024 Cancer Staging   Staging form: Lung, AJCC V9 - Clinical: Stage IA2 (cT1b, cN0, cM0) - Signed by [name], MD on 01/06/2024   03/09/2024 -  Chemotherapy   Patient is on Treatment Plan : LUNG  Carboplatin (AUC 5) D1 + Etoposide (100) D1-3 q21d     10/27/2024 Imaging   PET scan showed 1. Intense metabolic activity associated with a LEFT upper lobe pulmonary nodule consistent primary bronchogenic carcinoma. 2. No evidence of metastatic adenopathy in the mediastinum. No distant metastatic disease. 3. Intense metabolic activity associated with the RIGHT shoulder versus arthroplasty. Recommend correlation for loosening or infection. 4. Cirrhotic morphology of the liver. 5. Post CABG.     Denies unintentional weight loss, hemoptysis headache double vision. He has a history of smoking, currently smoking about a pack of cigarettes per day.  Denies any alcohol use. He experiences ongoing pain and swelling in his right shoulder, where he had a reverse shoulder replacement approximately four to five years ago. The PET scan showed activity in this area,  raising concerns about possible infection or chronic inflammation. He has limited range of motion and is unable to lift his arm beyond a certain point.   The patient presents for follow-up.  He was seen by cardiology recently was cleared to proceed with surgery.  However due to decreased pulmonary function, he was felt not a surgical candidate. Patient presents to discuss nonsurgical alternative treatments.  He has met with radiation oncology Dr. Chrystal and has been offered SBRT.  INTERVAL HISTORY ASENCION LOVEDAY is a 68 y.o. 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  Patient was accompanied by daughter today. He has had SBRT.  Today he has no new complaints.   MEDICAL HISTORY:  Past Medical History:  Diagnosis Date   Anemia    Anginal pain (HCC)    Arthritis    Atherosclerotic peripheral vascular disease with intermittent claudication (HCC)    Bronchitis    h/o   CHF (congestive heart failure) (HCC)    Cirrhosis, non-alcoholic (HCC)    Coronary artery disease    Depression  Diabetes mellitus without complication (HCC)    diet controlled   Difficult intubation    Pt Denies   Dyspnea    with exertion   GERD (gastroesophageal reflux disease)    Heart failure with mid-range ejection fraction (HFmEF) (HCC)    History of MRSA infection 2010   Hx of CABG    Hyperlipidemia    Hypertension    Insomnia    Ischemic cardiomyopathy    Liver cirrhosis (HCC)    Peripheral vascular disease (HCC)    Stomach ulcer    Stomach ulcer    Wears hearing aid in both ears     SURGICAL HISTORY: Past Surgical History:  Procedure Laterality Date   ANTERIOR CERVICAL DECOMP/DISCECTOMY FUSION N/A 01/28/2018   Procedure: ANTERIOR CERVICAL DECOMPRESSION/DISCECTOMY FUSION 2 LEVELS C3-5;  Surgeon: Jodeen Munch, MD;  Location: [facility] ORS;  Service: Neurosurgery;  Laterality: N/A;    BRONCHIAL BIOPSY  12/25/2023   Procedure: BRONCHIAL BIOPSIES;  Surgeon: Vergia Glasgow, MD;  Location: [facility];  Service: Pulmonary;;   BRONCHIAL BRUSHINGS  12/25/2023   Procedure: BRONCHIAL BRUSHINGS;  Surgeon: Vergia Glasgow, MD;  Location: [facility] ENDOSCOPY;  Service: Pulmonary;;   BRONCHIAL NEEDLE ASPIRATION BIOPSY  12/25/2023   Procedure: BRONCHIAL NEEDLE ASPIRATION BIOPSIES;  Surgeon: Vergia Glasgow, MD;  Location: [facility]OS[facility];  Service: Pulmonary;;   BRONCHIAL WASHINGS  12/25/2023   Procedure: BRONCHIAL WASHINGS;  Surgeon: Vergia Glasgow, MD;  Location: [facility];  Service: Pulmonary;;   COLON SURGERY  2004   due to diverticulitis.  Partail colectomy with Anastamosis   COLONOSCOPY N/A 03/27/2023   Procedure: COLONOSCOPY;  Surgeon: Selena Daily, MD;  Location: [facility] ENDOSCOPY;  Service: Gastroenterology;  Laterality: N/A;   COLONOSCOPY WITH PROPOFOL N/A 03/26/2023   Procedure: COLONOSCOPY WITH PROPOFOL;  Surgeon: Selena Daily, MD;  Location: [facility] ENDOSCOPY;  Service: Gastroenterology;  Laterality: N/A;   CORONARY ARTERY BYPASS GRAFT     ESOPHAGOGASTRODUODENOSCOPY (EGD) WITH PROPOFOL N/A 03/26/2023   Procedure: ESOPHAGOGASTRODUODENOSCOPY (EGD) WITH PROPOFOL;  Surgeon: Selena Daily, MD;  Location: [facility] ENDOSCOPY;  Service: Gastroenterology;  Laterality: N/A;   FINE NEEDLE ASPIRATION  12/25/2023   Procedure: FINE NEEDLE ASPIRATION (FNA) LINEAR;  Surgeon: Vergia Glasgow, MD;  Location: [facility]OS[facility];  Service: Pulmonary;;   HAND TENDON SURGERY Right    right thumb   LUMBAR LAMINECTOMY/DECOMPRESSION MICRODISCECTOMY N/A 09/01/2019   Procedure: LUMBAR LAMINECTOMY/DECOMPRESSION MICRODISCECTOMY 3 LEVELS L2-L5;  Surgeon: Jodeen Munch, MD;  Location: [facility] ORS;  Service: Neurosurgery;  Laterality: N/A;   neck fusion     SHOULDER ARTHROSCOPY WITH OPEN ROTATOR CUFF REPAIR Right 04/08/2016   Procedure: SHOULDER ARTHROSCOPY WITH OPEN ROTATOR CUFF REPAIR;  Surgeon: Marlynn Singer, MD;  Location: [facility] ORS;  Service: Orthopedics;  Laterality: Right;   TONSILLECTOMY     TRANSFORAMINAL LUMBAR INTERBODY FUSION W/ MIS 1 LEVEL N/A 09/01/2019   Procedure: MINIMALLY INVASIVE (MIS) TRANSFORAMINAL LUMBAR INTERBODY FUSION (TLIF) 1 LEVEL L5-S1;  Surgeon: Jodeen Munch, MD;  Location: [facility] ORS;  Service: Neurosurgery;  Laterality: N/A;   TYMPANOPLASTY Right 08/19/2023   Procedure: TYMPANOPLASTY WITH REMOVAL OF CHOLESTEATOMA;  Surgeon: Von Grumbling, MD;  Location: [facility];  Service: ENT;  Laterality: Right;   VIDEO BRONCHOSCOPY WITH ENDOBRONCHIAL ULTRASOUND  12/25/2023   Procedure: VIDEO BRONCHOSCOPY WITH ENDOBRONCHIAL ULTRASOUND;  Surgeon: Vergia Glasgow, MD;  Location: [facility];  Service: Pulmonary;;   VIDEO BRONCHOSCOPY WITH RADIAL ENDOBRONCHIAL ULTRASOUND  12/25/2023   Procedure: VIDEO BRONCHOSCOPY WITH RADIAL ENDOBRONCHIAL ULTRASOUND;  Surgeon: Vergia Glasgow, MD;  Location:  [facility] ENDOSCOPY;  Service: Pulmonary;;    SOCIAL HISTORY: Social History   Socioeconomic History   Marital status: Married    Spouse name: Not on file   Number of children: Not on file   Years of education: Not on file   Highest education level: Not on file  Occupational History   Not on file  Tobacco Use   Smoking status: Every Day    Current packs/day: 1.00    Average packs/day: 1 pack/day for 50.4 years (50.4 ttl pk-yrs)    Types: Cigarettes    Start date: 02/08/1968    Last attempt to quit: 02/07/2018   Smokeless tobacco: Never   Tobacco comments:    1PPD khj 01/07/2024  Vaping Use   Vaping status: Former  Substance and Sexual Activity   Alcohol use: Not Currently    Comment: rarely   Drug use: No   Sexual activity: Yes  Other Topics Concern   Not on file  Social History Narrative   Not on file   Social Drivers of Health   Financial Resource Strain: Not on file  Food Insecurity: No Food Insecurity (01/06/2024)   Hunger Vital Sign    Worried About Running  Out of Food in the Last Year: Never true    Ran Out of Food in the Last Year: Never true  Transportation Needs: No Transportation Needs (01/06/2024)   PRAPARE - Administrator, Civil Service (Medical): No    Lack of Transportation (Non-Medical): No  Physical Activity: Not on file  Stress: No Stress Concern Present (01/06/2024)   [employer] of Occupational Health - Occupational Stress Questionnaire    Feeling of Stress : Not at all  Social Connections: Not on file  Intimate Partner Violence: Not At Risk (01/06/2024)   Humiliation, Afraid, Rape, and Kick questionnaire    Fear of Current or Ex-Partner: No    Emotionally Abused: No    Physically Abused: No    Sexually Abused: No    FAMILY HISTORY: History reviewed. No pertinent family history.  ALLERGIES:  is allergic to penicillins and atorvastatin.  MEDICATIONS:  Current Outpatient Medications  Medication Sig Dispense Refill   albuterol (VENTOLIN HFA) 108 (90 Base) MCG/ACT inhaler Inhale 1-2 puffs into the lungs every 6 (six) hours as needed for wheezing or shortness of breath.     amitriptyline (ELAVIL) 50 MG tablet Take 50 mg by mouth at bedtime.     aspirin 325 MG tablet Take 325 mg by mouth daily.     ciprofloxacin-dexamethasone (CIPRODEX) OTIC suspension Place 4 drops into both ears 2 (two) times daily for 5 days. DOS 08/19/23. 7.5 mL 0   citalopram (CELEXA) 40 MG tablet Take 40 mg by mouth every morning.      dexamethasone (DECADRON) 4 MG tablet Take 2 tablets daily for 2 days, on days 4 and 5.Take with food. Every 21 days. 30 tablet 1   DULoxetine (CYMBALTA) 60 MG capsule Take 60 mg by mouth 2 (two) times daily.     fluticasone (FLONASE) 50 MCG/ACT nasal spray Place 2 sprays into both nostrils daily.     isosorbide mononitrate (IMDUR) 30 MG 24 hr tablet Take 30 mg by mouth daily.     lansoprazole (PREVACID) 15 MG capsule Take 15 mg by mouth daily at 12 noon.     meloxicam (MOBIC) 15 MG tablet Take 15 mg by  mouth every morning.     nitroGLYCERIN (NITROSTAT) 0.4 MG SL tablet Place 0.4 mg under  the tongue every 5 (five) minutes as needed for chest pain.     ofloxacin (OCUFLOX) 0.3 % ophthalmic solution Place 4 drops into the left ear 2 (two) times daily. 5 mL 5   omeprazole (PRILOSEC) 20 MG capsule Take 20 mg by mouth daily.     ondansetron (ZOFRAN) 8 MG tablet Take 1 tablet (8 mg total) by mouth every 8 (eight) hours as needed for nausea or vomiting. Start on third day after chemotherapy. 30 tablet 1   pregabalin (LYRICA) 300 MG capsule Take 300 mg by mouth 2 (two) times daily.     prochlorperazine (COMPAZINE) 10 MG tablet Take 1 tablet (10 mg total) by mouth every 6 (six) hours as needed for nausea or vomiting (Nausea or vomiting). 30 tablet 1   rosuvastatin (CRESTOR) 20 MG tablet Take 20 mg by mouth daily.     spironolactone (ALDACTONE) 25 MG tablet Take 12.5 mg by mouth daily.     tamsulosin (FLOMAX) 0.4 MG CAPS capsule Take 0.4 mg by mouth daily after supper.     umeclidinium-vilanterol (ANORO ELLIPTA) 62.5-25 MCG/ACT AEPB Inhale 1 puff into the lungs daily. 30 each 11   valsartan (DIOVAN) 320 MG tablet Take 320 mg by mouth daily.     No current facility-administered medications for this visit.   Facility-Administered Medications Ordered in Other Visits  Medication Dose Route Frequency Provider Last Rate Last Admin   0.9 %  sodium chloride infusion   Intravenous Continuous [name], MD 10 mL/hr at 03/09/24 1003 New Bag at 03/09/24 1003   etoposide (VEPESID) 200 mg in sodium chloride 0.9 % 500 mL chemo infusion  100 mg/m2 (Treatment Plan Recorded) Intravenous Once [name], MD 510 mL/hr at 03/09/24 1147 200 mg at 03/09/24 1147    Review of Systems  Constitutional:  Negative for appetite change, chills, fatigue, fever and unexpected weight change.  HENT:   Negative for hearing loss and voice change.   Eyes:  Negative for eye problems and icterus.  Respiratory:  Negative for chest tightness,  cough and shortness of breath.   Cardiovascular:  Negative for chest pain and leg swelling.  Gastrointestinal:  Negative for abdominal distention and abdominal pain.  Endocrine: Negative for hot flashes.  Genitourinary:  Negative for difficulty urinating, dysuria and frequency.   Musculoskeletal:  Positive for arthralgias.       Right shoulder pain with limited motion  Skin:  Negative for itching and rash.  Neurological:  Negative for light-headedness and numbness.  Hematological:  Negative for adenopathy. Does not bruise/bleed easily.  Psychiatric/Behavioral:  Negative for confusion.      PHYSICAL EXAMINATION: ECOG PERFORMANCE STATUS: 0 - Asymptomatic  Vitals:   03/09/24 0910  BP: 137/76  Pulse: 66  Resp: 19  Temp: 98.1 F (36.7 C)  SpO2: 97%   Filed Weights   03/09/24 0910  Weight: 183 lb 8 oz (83.2 kg)    Physical Exam Constitutional:      General: He is not in acute distress.    Appearance: He is not diaphoretic.  HENT:     Head: Normocephalic and atraumatic.  Eyes:     General: No scleral icterus. Cardiovascular:     Rate and Rhythm: Normal rate.  Pulmonary:     Effort: Pulmonary effort is normal. No respiratory distress.  Abdominal:     Tenderness: There is no abdominal tenderness.  Musculoskeletal:     Cervical back: Normal range of motion and neck supple.     Comments: Right  shoulder limited range of motion.  Skin:    Findings: No erythema or rash.  Neurological:     Mental Status: He is alert and oriented to person, place, and time. Mental status is at baseline.     Motor: No abnormal muscle tone.  Psychiatric:        Mood and Affect: Mood and affect normal.      LABORATORY DATA:  I have reviewed the data as listed    Latest Ref Rng & Units 03/09/2024    8:43 AM 12/25/2023    7:44 AM 03/12/2023    1:32 PM  CBC  WBC 4.0 - 10.5 K/uL 7.1  9.7  8.2   Hemoglobin 13.0 - 17.0 g/dL 52.8  41.3  24.4   Hematocrit 39.0 - 52.0 % 44.2  43.3  45.5    Platelets 150 - 400 K/uL 150  194  171       Latest Ref Rng & Units 03/09/2024    8:43 AM 12/25/2023    7:44 AM 03/12/2023    1:32 PM  CMP  Glucose 70 - 99 mg/dL 010  272  536   BUN 8 - 23 mg/dL 21  12  14    Creatinine 0.61 - 1.24 mg/dL 6.44  0.34  7.42   Sodium 135 - 145 mmol/L 135  136  143   Potassium 3.5 - 5.1 mmol/L 3.7  3.8  3.7   Chloride 98 - 111 mmol/L 100  102  102   CO2 22 - 32 mmol/L 25  23  25    Calcium 8.9 - 10.3 mg/dL 8.6  8.6  9.0   Total Protein 6.5 - 8.1 g/dL 7.2  7.0  7.0   Total Bilirubin 0.0 - 1.2 mg/dL 1.0  0.7  0.5   Alkaline Phos 38 - 126 U/L 111  120  142   AST 15 - 41 U/L 24  36  22   ALT 0 - 44 U/L 24  32  24      RADIOGRAPHIC STUDIES: I have personally reviewed the radiological images as listed and agreed with the findings in the report. MR Brain W Wo Contrast Result Date: 03/09/2024 CLINICAL DATA:  Brain lesion follow-up EXAM: MRI HEAD WITHOUT AND WITH CONTRAST TECHNIQUE: Multiplanar, multiecho pulse sequences of the brain and surrounding structures were obtained without and with intravenous contrast. CONTRAST:  7.53mL GADAVIST GADOBUTROL 1 MMOL/ML IV SOLN COMPARISON:  01/08/2024 FINDINGS: Brain: Enhancement at the left basal ganglia on prior has regressed and is even more faint an wispy, likely vessel or prior small vessel insult. No indication of metastatic disease today. Mild chronic small vessel ischemia. Small chronic left superior frontal cortex infarct. No hydrocephalus, collection, or atrophy. Vascular: Normal flow voids. Skull and upper cervical spine: Normal marrow signal. C3 and C4 ACDF at least. Severe C1-2 facet degeneration eccentric to the right Sinuses/Orbits: Unremarkable IMPRESSION: Regression of nodular enhancement at the left basal ganglia, likely vessel or recent small vessel infarct. No indication of metastatic disease on today's scan. Electronically Signed   By: Ronnette Coke M.D.   On: 03/09/2024 08:38

## 2024-03-09 NOTE — Patient Instructions (Signed)
 [facility] - A DEPT OF [facility]  Discharge Instructions: Thank you for choosing [facility] to provide your oncology and hematology care.  If you have a lab appointment with the Cancer Center, please go directly to the Cancer Center and check in at the registration area.  Wear comfortable clothing and clothing appropriate for easy access to any Portacath or PICC line.   We strive to give you quality time with your provider. You may need to reschedule your appointment if you arrive late (15 or more minutes).  Arriving late affects you and other patients whose appointments are after yours.  Also, if you miss three or more appointments without notifying the office, you may be dismissed from the clinic at the provider's discretion.      For prescription refill requests, have your pharmacy contact our office and allow 72 hours for refills to be completed.    Today you received the following chemotherapy and/or immunotherapy agents carboplatin and etoposide      To help prevent nausea and vomiting after your treatment, we encourage you to take your nausea medication as directed.  BELOW ARE SYMPTOMS THAT SHOULD BE REPORTED IMMEDIATELY: *FEVER GREATER THAN 100.4 F (38 C) OR HIGHER *CHILLS OR SWEATING *NAUSEA AND VOMITING THAT IS NOT CONTROLLED WITH YOUR NAUSEA MEDICATION *UNUSUAL SHORTNESS OF BREATH *UNUSUAL BRUISING OR BLEEDING *URINARY PROBLEMS (pain or burning when urinating, or frequent urination) *BOWEL PROBLEMS (unusual diarrhea, constipation, pain near the anus) TENDERNESS IN MOUTH AND THROAT WITH OR WITHOUT PRESENCE OF ULCERS (sore throat, sores in mouth, or a toothache) UNUSUAL RASH, SWELLING OR PAIN  UNUSUAL VAGINAL DISCHARGE OR ITCHING   Items with * indicate a potential emergency and should be followed up as soon as possible or go to the Emergency Department if any problems should occur.  Please show the CHEMOTHERAPY ALERT CARD or  IMMUNOTHERAPY ALERT CARD at check-in to the Emergency Department and triage nurse.  Should you have questions after your visit or need to cancel or reschedule your appointment, please contact [facility] - A DEPT OF [facility]  [phone] and follow the prompts.  Office hours are 8:00 a.m. to 4:30 p.m. Monday - Friday. Please note that voicemails left after 4:00 p.m. may not be returned until the following business day.  We are closed weekends and major holidays. You have access to a nurse at all times for urgent questions. Please call the main number to the clinic [phone] and follow the prompts.  For any non-urgent questions, you may also contact your provider using MyChart. We now offer e-Visits for anyone 70 and older to request care online for non-urgent symptoms. For details visit [email or web address].   Also download the MyChart app! Go to the app store, search "MyChart", open the app, select [facility], and log in with your MyChart username and password.

## 2024-03-10 ENCOUNTER — Inpatient Hospital Stay

## 2024-03-10 ENCOUNTER — Telehealth: Payer: Self-pay

## 2024-03-10 VITALS — BP 130/63 | HR 66 | Temp 97.7°F | Resp 18

## 2024-03-10 DIAGNOSIS — G4700 Insomnia, unspecified: Secondary | ICD-10-CM | POA: Diagnosis not present

## 2024-03-10 DIAGNOSIS — F17210 Nicotine dependence, cigarettes, uncomplicated: Secondary | ICD-10-CM | POA: Diagnosis not present

## 2024-03-10 DIAGNOSIS — Z5111 Encounter for antineoplastic chemotherapy: Secondary | ICD-10-CM | POA: Diagnosis not present

## 2024-03-10 DIAGNOSIS — Z5189 Encounter for other specified aftercare: Secondary | ICD-10-CM | POA: Diagnosis not present

## 2024-03-10 DIAGNOSIS — K7469 Other cirrhosis of liver: Secondary | ICD-10-CM | POA: Diagnosis not present

## 2024-03-10 DIAGNOSIS — C7A1 Malignant poorly differentiated neuroendocrine tumors: Secondary | ICD-10-CM | POA: Diagnosis not present

## 2024-03-10 DIAGNOSIS — M25511 Pain in right shoulder: Secondary | ICD-10-CM | POA: Diagnosis not present

## 2024-03-10 DIAGNOSIS — Z923 Personal history of irradiation: Secondary | ICD-10-CM | POA: Diagnosis not present

## 2024-03-10 DIAGNOSIS — E1165 Type 2 diabetes mellitus with hyperglycemia: Secondary | ICD-10-CM | POA: Diagnosis not present

## 2024-03-10 DIAGNOSIS — Z7982 Long term (current) use of aspirin: Secondary | ICD-10-CM | POA: Diagnosis not present

## 2024-03-10 MED ORDER — SODIUM CHLORIDE 0.9 % IV SOLN
100.0000 mg/m2 | Freq: Once | INTRAVENOUS | Status: AC
  Administered 2024-03-10: 200 mg via INTRAVENOUS
  Filled 2024-03-10: qty 10

## 2024-03-10 MED ORDER — SODIUM CHLORIDE 0.9 % IV SOLN
INTRAVENOUS | Status: DC
  Filled 2024-03-10: qty 250

## 2024-03-10 MED ORDER — DEXAMETHASONE SODIUM PHOSPHATE 10 MG/ML IJ SOLN
10.0000 mg | Freq: Once | INTRAMUSCULAR | Status: AC
  Administered 2024-03-10: 10 mg via INTRAVENOUS
  Filled 2024-03-10: qty 1

## 2024-03-10 NOTE — Telephone Encounter (Signed)
Telephone call to patient for follow up after receiving first infusion.   Patient states infusion went great.  States eating good and drinking plenty of fluids.   Denies any nausea or vomiting.  Encouraged patient to call for any concerns or questions. 

## 2024-03-10 NOTE — Patient Instructions (Signed)
 [facility] - A DEPT OF [facility]  Discharge Instructions: Thank you for choosing [facility] to provide your oncology and hematology care.  If you have a lab appointment with the Cancer Center, please go directly to the Cancer Center and check in at the registration area.  Wear comfortable clothing and clothing appropriate for easy access to any Portacath or PICC line.   We strive to give you quality time with your provider. You may need to reschedule your appointment if you arrive late (15 or more minutes).  Arriving late affects you and other patients whose appointments are after yours.  Also, if you miss three or more appointments without notifying the office, you may be dismissed from the clinic at the provider's discretion.      For prescription refill requests, have your pharmacy contact our office and allow 72 hours for refills to be completed.    Today you received the following chemotherapy and/or immunotherapy agents Etoposide      To help prevent nausea and vomiting after your treatment, we encourage you to take your nausea medication as directed.  BELOW ARE SYMPTOMS THAT SHOULD BE REPORTED IMMEDIATELY: *FEVER GREATER THAN 100.4 F (38 C) OR HIGHER *CHILLS OR SWEATING *NAUSEA AND VOMITING THAT IS NOT CONTROLLED WITH YOUR NAUSEA MEDICATION *UNUSUAL SHORTNESS OF BREATH *UNUSUAL BRUISING OR BLEEDING *URINARY PROBLEMS (pain or burning when urinating, or frequent urination) *BOWEL PROBLEMS (unusual diarrhea, constipation, pain near the anus) TENDERNESS IN MOUTH AND THROAT WITH OR WITHOUT PRESENCE OF ULCERS (sore throat, sores in mouth, or a toothache) UNUSUAL RASH, SWELLING OR PAIN  UNUSUAL VAGINAL DISCHARGE OR ITCHING   Items with * indicate a potential emergency and should be followed up as soon as possible or go to the Emergency Department if any problems should occur.  Please show the CHEMOTHERAPY ALERT CARD or IMMUNOTHERAPY  ALERT CARD at check-in to the Emergency Department and triage nurse.  Should you have questions after your visit or need to cancel or reschedule your appointment, please contact [facility] - A DEPT OF [facility]  [phone] and follow the prompts.  Office hours are 8:00 a.m. to 4:30 p.m. Monday - Friday. Please note that voicemails left after 4:00 p.m. may not be returned until the following business day.  We are closed weekends and major holidays. You have access to a nurse at all times for urgent questions. Please call the main number to the clinic [phone] and follow the prompts.  For any non-urgent questions, you may also contact your provider using MyChart. We now offer e-Visits for anyone 36 and older to request care online for non-urgent symptoms. For details visit [email or web address].   Also download the MyChart app! Go to the app store, search "MyChart", open the app, select [place], and log in with your MyChart username and password.

## 2024-03-11 ENCOUNTER — Inpatient Hospital Stay

## 2024-03-11 VITALS — BP 154/70 | HR 58 | Temp 98.3°F | Resp 16

## 2024-03-11 DIAGNOSIS — E1165 Type 2 diabetes mellitus with hyperglycemia: Secondary | ICD-10-CM | POA: Diagnosis not present

## 2024-03-11 DIAGNOSIS — K7469 Other cirrhosis of liver: Secondary | ICD-10-CM | POA: Diagnosis not present

## 2024-03-11 DIAGNOSIS — F17210 Nicotine dependence, cigarettes, uncomplicated: Secondary | ICD-10-CM | POA: Diagnosis not present

## 2024-03-11 DIAGNOSIS — M25511 Pain in right shoulder: Secondary | ICD-10-CM | POA: Diagnosis not present

## 2024-03-11 DIAGNOSIS — C7A1 Malignant poorly differentiated neuroendocrine tumors: Secondary | ICD-10-CM | POA: Diagnosis not present

## 2024-03-11 DIAGNOSIS — Z7982 Long term (current) use of aspirin: Secondary | ICD-10-CM | POA: Diagnosis not present

## 2024-03-11 DIAGNOSIS — Z5189 Encounter for other specified aftercare: Secondary | ICD-10-CM | POA: Diagnosis not present

## 2024-03-11 DIAGNOSIS — Z5111 Encounter for antineoplastic chemotherapy: Secondary | ICD-10-CM | POA: Diagnosis not present

## 2024-03-11 DIAGNOSIS — G4700 Insomnia, unspecified: Secondary | ICD-10-CM | POA: Diagnosis not present

## 2024-03-11 DIAGNOSIS — Z923 Personal history of irradiation: Secondary | ICD-10-CM | POA: Diagnosis not present

## 2024-03-11 MED ORDER — SODIUM CHLORIDE 0.9 % IV SOLN
100.0000 mg/m2 | Freq: Once | INTRAVENOUS | Status: AC
  Administered 2024-03-11: 200 mg via INTRAVENOUS
  Filled 2024-03-11: qty 10

## 2024-03-11 MED ORDER — SODIUM CHLORIDE 0.9 % IV SOLN
INTRAVENOUS | Status: DC
  Filled 2024-03-11: qty 250

## 2024-03-11 MED ORDER — DEXAMETHASONE SODIUM PHOSPHATE 10 MG/ML IJ SOLN
10.0000 mg | Freq: Once | INTRAMUSCULAR | Status: AC
  Administered 2024-03-11: 10 mg via INTRAVENOUS
  Filled 2024-03-11: qty 1

## 2024-03-11 NOTE — Patient Instructions (Signed)
 [facility]  Discharge Instructions: Thank you for choosing [facility] to provide your oncology and hematology care.  If you have a lab appointment with the Cancer Center, please go directly to the Cancer Center and check in at the registration area.  Wear comfortable clothing and clothing appropriate for easy access to any Portacath or PICC line.   We strive to give you quality time with your provider. You may need to reschedule your appointment if you arrive late (15 or more minutes).  Arriving late affects you and other patients whose appointments are after yours.  Also, if you miss three or more appointments without notifying the office, you may be dismissed from the clinic at the provider's discretion.      For prescription refill requests, have your pharmacy contact our office and allow 72 hours for refills to be completed.    Today you received the following chemotherapy and/or immunotherapy agents- etoposide      To help prevent nausea and vomiting after your treatment, we encourage you to take your nausea medication as directed.  BELOW ARE SYMPTOMS THAT SHOULD BE REPORTED IMMEDIATELY: *FEVER GREATER THAN 100.4 F (38 C) OR HIGHER *CHILLS OR SWEATING *NAUSEA AND VOMITING THAT IS NOT CONTROLLED WITH YOUR NAUSEA MEDICATION *UNUSUAL SHORTNESS OF BREATH *UNUSUAL BRUISING OR BLEEDING *URINARY PROBLEMS (pain or burning when urinating, or frequent urination) *BOWEL PROBLEMS (unusual diarrhea, constipation, pain near the anus) TENDERNESS IN MOUTH AND THROAT WITH OR WITHOUT PRESENCE OF ULCERS (sore throat, sores in mouth, or a toothache) UNUSUAL RASH, SWELLING OR PAIN  UNUSUAL VAGINAL DISCHARGE OR ITCHING   Items with * indicate a potential emergency and should be followed up as soon as possible or go to the Emergency Department if any problems should occur.  Please show the CHEMOTHERAPY ALERT CARD or IMMUNOTHERAPY  ALERT CARD at check-in to the Emergency Department and triage nurse.  Should you have questions after your visit or need to cancel or reschedule your appointment, please contact [facility] - A DEPT OF [facility]  [phone] and follow the prompts.  Office hours are 8:00 a.m. to 4:30 p.m. Monday - Friday. Please note that voicemails left after 4:00 p.m. may not be returned until the following business day.  We are closed weekends and major holidays. You have access to a nurse at all times for urgent questions. Please call the main number to the clinic [phone] and follow the prompts.  For any non-urgent questions, you may also contact your provider using MyChart. We now offer e-Visits for anyone 29 and older to request care online for non-urgent symptoms. For details visit [email or web address].   Also download the MyChart app! Go to the app store, search "MyChart", open the app, select [place], and log in with your MyChart username and password.

## 2024-03-12 ENCOUNTER — Inpatient Hospital Stay

## 2024-03-12 ENCOUNTER — Encounter: Payer: Self-pay | Admitting: Oncology

## 2024-03-12 DIAGNOSIS — E1165 Type 2 diabetes mellitus with hyperglycemia: Secondary | ICD-10-CM | POA: Diagnosis not present

## 2024-03-12 DIAGNOSIS — G4700 Insomnia, unspecified: Secondary | ICD-10-CM | POA: Diagnosis not present

## 2024-03-12 DIAGNOSIS — C7A1 Malignant poorly differentiated neuroendocrine tumors: Secondary | ICD-10-CM | POA: Diagnosis not present

## 2024-03-12 DIAGNOSIS — K7469 Other cirrhosis of liver: Secondary | ICD-10-CM | POA: Diagnosis not present

## 2024-03-12 DIAGNOSIS — Z5189 Encounter for other specified aftercare: Secondary | ICD-10-CM | POA: Diagnosis not present

## 2024-03-12 DIAGNOSIS — Z5111 Encounter for antineoplastic chemotherapy: Secondary | ICD-10-CM | POA: Diagnosis not present

## 2024-03-12 DIAGNOSIS — Z7982 Long term (current) use of aspirin: Secondary | ICD-10-CM | POA: Diagnosis not present

## 2024-03-12 DIAGNOSIS — M25511 Pain in right shoulder: Secondary | ICD-10-CM | POA: Diagnosis not present

## 2024-03-12 DIAGNOSIS — F17210 Nicotine dependence, cigarettes, uncomplicated: Secondary | ICD-10-CM | POA: Diagnosis not present

## 2024-03-12 DIAGNOSIS — Z923 Personal history of irradiation: Secondary | ICD-10-CM | POA: Diagnosis not present

## 2024-03-12 MED ORDER — PEGFILGRASTIM-JMDB 6 MG/0.6ML ~~LOC~~ SOSY
6.0000 mg | PREFILLED_SYRINGE | Freq: Once | SUBCUTANEOUS | Status: AC
  Administered 2024-03-12: 6 mg via SUBCUTANEOUS
  Filled 2024-03-12: qty 0.6

## 2024-03-15 ENCOUNTER — Encounter: Payer: Self-pay | Admitting: *Deleted

## 2024-03-16 ENCOUNTER — Encounter: Payer: Self-pay | Admitting: Oncology

## 2024-03-16 ENCOUNTER — Encounter: Payer: Self-pay | Admitting: *Deleted

## 2024-03-16 ENCOUNTER — Inpatient Hospital Stay (HOSPITAL_BASED_OUTPATIENT_CLINIC_OR_DEPARTMENT_OTHER): Admitting: Oncology

## 2024-03-16 ENCOUNTER — Inpatient Hospital Stay

## 2024-03-16 VITALS — BP 140/74 | HR 72 | Temp 97.6°F | Resp 98 | Wt 183.7 lb

## 2024-03-16 DIAGNOSIS — C7A1 Malignant poorly differentiated neuroendocrine tumors: Secondary | ICD-10-CM

## 2024-03-16 DIAGNOSIS — Z923 Personal history of irradiation: Secondary | ICD-10-CM | POA: Diagnosis not present

## 2024-03-16 DIAGNOSIS — Z5189 Encounter for other specified aftercare: Secondary | ICD-10-CM | POA: Diagnosis not present

## 2024-03-16 DIAGNOSIS — Z5111 Encounter for antineoplastic chemotherapy: Secondary | ICD-10-CM | POA: Diagnosis not present

## 2024-03-16 DIAGNOSIS — K7469 Other cirrhosis of liver: Secondary | ICD-10-CM

## 2024-03-16 DIAGNOSIS — E1165 Type 2 diabetes mellitus with hyperglycemia: Secondary | ICD-10-CM | POA: Diagnosis not present

## 2024-03-16 DIAGNOSIS — G4700 Insomnia, unspecified: Secondary | ICD-10-CM | POA: Diagnosis not present

## 2024-03-16 DIAGNOSIS — Z7982 Long term (current) use of aspirin: Secondary | ICD-10-CM | POA: Diagnosis not present

## 2024-03-16 DIAGNOSIS — M25511 Pain in right shoulder: Secondary | ICD-10-CM | POA: Diagnosis not present

## 2024-03-16 DIAGNOSIS — F17210 Nicotine dependence, cigarettes, uncomplicated: Secondary | ICD-10-CM | POA: Diagnosis not present

## 2024-03-16 LAB — CBC WITH DIFFERENTIAL (CANCER CENTER ONLY)
Abs Immature Granulocytes: 0.38 10*3/uL — ABNORMAL HIGH (ref 0.00–0.07)
Basophils Absolute: 0.1 10*3/uL (ref 0.0–0.1)
Basophils Relative: 2 %
Eosinophils Absolute: 0.2 10*3/uL (ref 0.0–0.5)
Eosinophils Relative: 5 %
HCT: 40.6 % (ref 39.0–52.0)
Hemoglobin: 13.6 g/dL (ref 13.0–17.0)
Immature Granulocytes: 8 %
Lymphocytes Relative: 17 %
Lymphs Abs: 0.8 10*3/uL (ref 0.7–4.0)
MCH: 32 pg (ref 26.0–34.0)
MCHC: 33.5 g/dL (ref 30.0–36.0)
MCV: 95.5 fL (ref 80.0–100.0)
Monocytes Absolute: 0.1 10*3/uL (ref 0.1–1.0)
Monocytes Relative: 2 %
Neutro Abs: 3.2 10*3/uL (ref 1.7–7.7)
Neutrophils Relative %: 66 %
Platelet Count: 96 10*3/uL — ABNORMAL LOW (ref 150–400)
RBC: 4.25 MIL/uL (ref 4.22–5.81)
RDW: 13 % (ref 11.5–15.5)
Smear Review: NORMAL
WBC Count: 4.8 10*3/uL (ref 4.0–10.5)
nRBC: 0 % (ref 0.0–0.2)

## 2024-03-16 LAB — CMP (CANCER CENTER ONLY)
ALT: 90 U/L — ABNORMAL HIGH (ref 0–44)
AST: 65 U/L — ABNORMAL HIGH (ref 15–41)
Albumin: 3.8 g/dL (ref 3.5–5.0)
Alkaline Phosphatase: 148 U/L — ABNORMAL HIGH (ref 38–126)
Anion gap: 10 (ref 5–15)
BUN: 24 mg/dL — ABNORMAL HIGH (ref 8–23)
CO2: 25 mmol/L (ref 22–32)
Calcium: 8.2 mg/dL — ABNORMAL LOW (ref 8.9–10.3)
Chloride: 98 mmol/L (ref 98–111)
Creatinine: 1.13 mg/dL (ref 0.61–1.24)
GFR, Estimated: >60 mL/min (ref >60)
Glucose, Bld: 329 mg/dL — ABNORMAL HIGH (ref 70–99)
Potassium: 5 mmol/L (ref 3.5–5.1)
Sodium: 133 mmol/L — ABNORMAL LOW (ref 135–145)
Total Bilirubin: 1.4 mg/dL — ABNORMAL HIGH (ref 0.0–1.2)
Total Protein: 6.5 g/dL (ref 6.5–8.1)

## 2024-03-16 NOTE — Assessment & Plan Note (Signed)
 Recommend otc melatonin 5mg  at bedtime PRN.  Recommend patient to decrease caffeine intake

## 2024-03-16 NOTE — Progress Notes (Signed)
 Met with patient during follow up visit with Dr. Wilhelmenia Harada. Pt did not have any questions or needs at this time. Instructed to keep appts as scheduled and to call with any questions or needs. Pt and daughter verbalized understanding.

## 2024-03-16 NOTE — Progress Notes (Signed)
 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 Not a surgical candidate.  S/p SBRT.  Recommend adjuvant carboplatin  and Etoposide  for 4 cycles. .  Labs are reviewed and discussed with patient. S/p cycle 1 D1carboplatin and D1-3 Etoposide  with GCSF on D5. Recommend claritin 10mg  daily x 4 days.  Overall he tolerates treatment well.   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  He is on Aspirin 325mg  daily and Statin.    Other cirrhosis of liver (HCC) Previously compensated. Transaminitis/hyperbilirubinemia can be due to recent chemotherapy.  He has US  abdomen scheduled on 5/5 Recommend patient continue follow-up with GI  Insomnia Recommend otc melatonin 5mg  at bedtime PRN.  Recommend patient to decrease caffeine intake  Hyperglycemia due to diabetes mellitus (HCC) Previously have diet controlled DM.  Glucose level in 300s. Partially due to steroid use.  Recommend patient to start diabetic diet. Refer to nutritionist for education.  Recommend patient to discuss with PCP regarding need of starting DM treatments.    Orders Placed This Encounter  Procedures   Ambulatory Referral to [facility] Nutrition    Referral Priority:   Routine    Referral Type:   Consultation    Referral Reason:   Specialty Services Required    Number of Visits Requested:   1   Follow-up 2 weeks  All questions were answered. The patient knows to call the clinic with any problems, questions or concerns.  Shane Forbes, MD, PhD [facility] Hematology Oncology 03/16/2024    HISTORY OF PRESENTING ILLNESS:  [name] 68 y.o. male presents to establish care for stage I large  cell high-grade neuroendocrine carcinoma of lung. I have reviewed his chart and materials related to his cancer extensively and collaborated history with the patient. Summary of oncologic history is as follows: Oncology History  High grade neuroendocrine carcinoma of lung (HCC)  10/28/2023 Imaging   PET scan showed 1. Intense metabolic activity associated with a LEFT upper lobe pulmonary nodule consistent primary bronchogenic carcinoma. 2. No evidence of metastatic adenopathy in the mediastinum. No distant metastatic disease. 3. Intense metabolic activity associated with the RIGHT shoulder versus arthroplasty. Recommend correlation for loosening or infection. 4. Cirrhotic morphology of the liver. 5. Post CABG.   11/25/2023 Imaging   CT lung nodule   1. Enlarging microlobulated lingular nodule, now 15.9 mm in size. Lung-RADS 4B, suspicious. Additional imaging evaluation or consultation with Pulmonology or Thoracic Surgery recommended. These results will be called to the ordering clinician or representative by the Radiologist Assistant, and communication documented in the PACS or [employer]. 2. New 4.4 cm lingular nodule.  Recommend attention on follow-up. 3. Cirrhosis. 4.  Aortic atherosclerosis (ICD10-I70.0). 5.  Emphysema    01/06/2024 Initial Diagnosis   High grade neuroendocrine carcinoma of lung   Patient was evaluated by pulmonology and underwent biopsy via bronchoscopy. A.  LEFT LUNG, UPPER LOBE, NODULE, FINE NEEDLE ASPIRATION  BIOPSY:  - Malignant  - Non-small cell carcinoma (see comment)   B.  LEFT LUNG, UPPER LOBE, NODULE, BRUSHING:  - Malignant  - Non-small cell carcinoma (see comment)   ADDENDUM 1 :  Immunohistochemistry is performed to classify the carcinoma.  The tumor cells are negative with  TTF-1 and p40.  Additional  immunohistochemistry will be performed and reported in a subsequent  addendum.   ADDENDUM 2: Additional immunohistochemistry is  performed to better  characterize the carcinoma and the malignant cells are positive with  synaptophysin and negative with cytokeratin 5/6, CD56 and chromogranin.  Ki-67 shows a moderately increased proliferation rate.  The presence of  synaptophysin positivity is most consistent with intermediate to  high-grade neuroendocrine carcinoma   -- I discussed with pathologist and confirmed the large cell neuroendocrine carcinoma histology.  D. LYMPH NODE, STATION 7, FINE NEEDLE ASPIRATION:  - Negative for malignancy  - Compatible with a benign lymph node   E. LYMPH NODE, STATION 11L, FINE NEEDLE ASPIRATION:  - Negative for malignancy  - Compatible with a benign lymph node   F. LUNG, LUL, LAVAGE:   FINAL MICROSCOPIC DIAGNOSIS:  - No malignant cells identified  - Pulmonary macrophages and benign bronchial cells    01/06/2024 Cancer Staging   Staging form: Lung, AJCC V9 - Clinical: Stage IA2 (cT1b, cN0, cM0) - Signed by Shane Forbes, MD on 01/06/2024   02/05/2024 - 02/17/2024 Radiation Therapy   SBRT x 5 treatment to lung    03/03/2024 Imaging   MRI brain w wo contrast  Regression of nodular enhancement at the left basal ganglia, likely vessel or recent small vessel infarct. No indication of metastatic disease on today's scan.   03/09/2024 -  Chemotherapy   Patient is on Treatment Plan : LUNG  Carboplatin  (AUC 5) D1 + Etoposide  (100) D1-3 q21d       Denies unintentional weight loss, hemoptysis headache double vision. He has a history of smoking, currently smoking about a pack of cigarettes per day.  Denies any alcohol use. He experiences ongoing pain and swelling in his right shoulder, where he had a reverse shoulder replacement approximately four to five years ago. The PET scan showed activity in this area, raising concerns about possible infection or chronic inflammation. He has limited range of motion and is unable to lift his arm beyond a certain point. He was seen by cardiology recently  was cleared to proceed with surgery.  However due to decreased pulmonary function, he was felt not a surgical candidate. Patient presents to discuss nonsurgical alternative treatments.  He has met with radiation oncology Dr. Chrystal and has been offered SBRT.  INTERVAL HISTORY DEMONDRE AGUAS is a 68 y.o. 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  Patient was accompanied by daughter today. Tolerated cycle 1 Carboplatin  Etoposide  Today he has no new complaints. Denies nausea, vomiting, diarrhea.  His appetite is good and patient's weight is stable.  + insomnia, he drinks 2-3 cups of coffee daily.    MEDICAL HISTORY:  Past Medical History:  Diagnosis Date   Anemia    Anginal pain (HCC)    Arthritis    Atherosclerotic peripheral vascular disease with intermittent claudication (HCC)    Bronchitis    h/o   CHF (congestive heart failure) (HCC)    Cirrhosis, non-alcoholic (HCC)    Coronary artery disease    Depression    Diabetes mellitus without complication (HCC)    diet controlled   Difficult intubation    Pt Denies   Dyspnea    with exertion   GERD (gastroesophageal reflux disease)    Heart failure with mid-range ejection fraction (HFmEF) (HCC)    History of MRSA infection  2010   Hx of CABG    Hyperlipidemia    Hypertension    Insomnia    Ischemic cardiomyopathy    Liver cirrhosis (HCC)    Peripheral vascular disease (HCC)    Stomach ulcer    Stomach ulcer    Wears hearing aid in both ears     SURGICAL HISTORY: Past Surgical History:  Procedure Laterality Date   ANTERIOR CERVICAL DECOMP/DISCECTOMY FUSION N/A 01/28/2018   Procedure: ANTERIOR CERVICAL DECOMPRESSION/DISCECTOMY FUSION 2 LEVELS C3-5;  Surgeon: Jodeen Munch, MD;  Location: [facility] ORS;  Service: Neurosurgery;  Laterality: N/A;   BRONCHIAL BIOPSY  12/25/2023   Procedure: BRONCHIAL BIOPSIES;   Surgeon: Vergia Glasgow, MD;  Location: [facility];  Service: Pulmonary;;   BRONCHIAL BRUSHINGS  12/25/2023   Procedure: BRONCHIAL BRUSHINGS;  Surgeon: Vergia Glasgow, MD;  Location: [facility] ENDOSCOPY;  Service: Pulmonary;;   BRONCHIAL NEEDLE ASPIRATION BIOPSY  12/25/2023   Procedure: BRONCHIAL NEEDLE ASPIRATION BIOPSIES;  Surgeon: Vergia Glasgow, MD;  Location: [facility] ENDOSCOPY;  Service: Pulmonary;;   BRONCHIAL WASHINGS  12/25/2023   Procedure: BRONCHIAL WASHINGS;  Surgeon: Vergia Glasgow, MD;  Location: [facility];  Service: Pulmonary;;   COLON SURGERY  2004   due to diverticulitis.  Partail colectomy with Anastamosis   COLONOSCOPY N/A 03/27/2023   Procedure: COLONOSCOPY;  Surgeon: Selena Daily, MD;  Location: [facility] ENDOSCOPY;  Service: Gastroenterology;  Laterality: N/A;   COLONOSCOPY WITH PROPOFOL  N/A 03/26/2023   Procedure: COLONOSCOPY WITH PROPOFOL ;  Surgeon: Selena Daily, MD;  Location: [facility] ENDOSCOPY;  Service: Gastroenterology;  Laterality: N/A;   CORONARY ARTERY BYPASS GRAFT     ESOPHAGOGASTRODUODENOSCOPY (EGD) WITH PROPOFOL  N/A 03/26/2023   Procedure: ESOPHAGOGASTRODUODENOSCOPY (EGD) WITH PROPOFOL ;  Surgeon: Selena Daily, MD;  Location: [facility] ENDOSCOPY;  Service: Gastroenterology;  Laterality: N/A;   FINE NEEDLE ASPIRATION  12/25/2023   Procedure: FINE NEEDLE ASPIRATION (FNA) LINEAR;  Surgeon: Vergia Glasgow, MD;  Location: [facility];  Service: Pulmonary;;   HAND TENDON SURGERY Right    right thumb   LUMBAR LAMINECTOMY/DECOMPRESSION MICRODISCECTOMY N/A 09/01/2019   Procedure: LUMBAR LAMINECTOMY/DECOMPRESSION MICRODISCECTOMY 3 LEVELS L2-L5;  Surgeon: Jodeen Munch, MD;  Location: [facility] ORS;  Service: Neurosurgery;  Laterality: N/A;   neck fusion     SHOULDER ARTHROSCOPY WITH OPEN ROTATOR CUFF REPAIR Right 04/08/2016   Procedure: SHOULDER ARTHROSCOPY WITH OPEN ROTATOR CUFF REPAIR;  Surgeon: Marlynn Singer, MD;  Location: [facility] ORS;  Service: Orthopedics;   Laterality: Right;   TONSILLECTOMY     TRANSFORAMINAL LUMBAR INTERBODY FUSION W/ MIS 1 LEVEL N/A 09/01/2019   Procedure: MINIMALLY INVASIVE (MIS) TRANSFORAMINAL LUMBAR INTERBODY FUSION (TLIF) 1 LEVEL L5-S1;  Surgeon: Jodeen Munch, MD;  Location: [facility] ORS;  Service: Neurosurgery;  Laterality: N/A;   TYMPANOPLASTY Right 08/19/2023   Procedure: TYMPANOPLASTY WITH REMOVAL OF CHOLESTEATOMA;  Surgeon: Von Grumbling, MD;  Location: [facility];  Service: ENT;  Laterality: Right;   VIDEO BRONCHOSCOPY WITH ENDOBRONCHIAL ULTRASOUND  12/25/2023   Procedure: VIDEO BRONCHOSCOPY WITH ENDOBRONCHIAL ULTRASOUND;  Surgeon: Vergia Glasgow, MD;  Location: [facility];  Service: Pulmonary;;   VIDEO BRONCHOSCOPY WITH RADIAL ENDOBRONCHIAL ULTRASOUND  12/25/2023   Procedure: VIDEO BRONCHOSCOPY WITH RADIAL ENDOBRONCHIAL ULTRASOUND;  Surgeon: Vergia Glasgow, MD;  Location: [facility];  Service: Pulmonary;;    SOCIAL HISTORY: Social History   Socioeconomic History   Marital status: Married    Spouse name: Not on file   Number of children: Not on file   Years of education: Not on file   Highest education level: Not  on file  Occupational History   Not on file  Tobacco Use   Smoking status: Every Day    Current packs/day: 1.00    Average packs/day: 1 pack/day for 50.4 years (50.4 ttl pk-yrs)    Types: Cigarettes    Start date: 02/08/1968    Last attempt to quit: 02/07/2018   Smokeless tobacco: Never   Tobacco comments:    1PPD khj 01/07/2024  Vaping Use   Vaping status: Former  Substance and Sexual Activity   Alcohol use: Not Currently    Comment: rarely   Drug use: No   Sexual activity: Yes  Other Topics Concern   Not on file  Social History Narrative   Not on file   Social Drivers of Health   Financial Resource Strain: Not on file  Food Insecurity: No Food Insecurity (01/06/2024)   Hunger Vital Sign    Worried About Running Out of Food in the Last Year: Never true    Ran Out of  Food in the Last Year: Never true  Transportation Needs: No Transportation Needs (01/06/2024)   PRAPARE - Administrator, Civil Service (Medical): No    Lack of Transportation (Non-Medical): No  Physical Activity: Not on file  Stress: No Stress Concern Present (01/06/2024)   [employer] of Occupational Health - Occupational Stress Questionnaire    Feeling of Stress : Not at all  Social Connections: Not on file  Intimate Partner Violence: Not At Risk (01/06/2024)   Humiliation, Afraid, Rape, and Kick questionnaire    Fear of Current or Ex-Partner: No    Emotionally Abused: No    Physically Abused: No    Sexually Abused: No    FAMILY HISTORY: History reviewed. No pertinent family history.  ALLERGIES:  is allergic to penicillins and atorvastatin.  MEDICATIONS:  Current Outpatient Medications  Medication Sig Dispense Refill   albuterol  (VENTOLIN  HFA) 108 (90 Base) MCG/ACT inhaler Inhale 1-2 puffs into the lungs every 6 (six) hours as needed for wheezing or shortness of breath.     amitriptyline  (ELAVIL ) 50 MG tablet Take 50 mg by mouth at bedtime.     aspirin 325 MG tablet Take 325 mg by mouth daily.     ciprofloxacin -dexamethasone  (CIPRODEX ) OTIC suspension Place 4 drops into both ears 2 (two) times daily for 5 days. DOS 08/19/23. 7.5 mL 0   citalopram  (CELEXA ) 40 MG tablet Take 40 mg by mouth every morning.      dexamethasone  (DECADRON ) 4 MG tablet Take 2 tablets daily for 2 days, on days 4 and 5.Take with food. Every 21 days. 30 tablet 1   DULoxetine (CYMBALTA) 60 MG capsule Take 60 mg by mouth 2 (two) times daily.     fluticasone (FLONASE) 50 MCG/ACT nasal spray Place 2 sprays into both nostrils daily.     isosorbide mononitrate (IMDUR) 30 MG 24 hr tablet Take 30 mg by mouth daily.     lansoprazole (PREVACID) 15 MG capsule Take 15 mg by mouth daily at 12 noon.     meloxicam  (MOBIC ) 15 MG tablet Take 15 mg by mouth every morning.     nitroGLYCERIN  (NITROSTAT ) 0.4 MG  SL tablet Place 0.4 mg under the tongue every 5 (five) minutes as needed for chest pain.     ofloxacin  (OCUFLOX ) 0.3 % ophthalmic solution Place 4 drops into the left ear 2 (two) times daily. 5 mL 5   omeprazole  (PRILOSEC ) 20 MG capsule Take 20 mg by mouth daily.  ondansetron  (ZOFRAN ) 8 MG tablet Take 1 tablet (8 mg total) by mouth every 8 (eight) hours as needed for nausea or vomiting. Start on third day after chemotherapy. 30 tablet 1   pregabalin (LYRICA) 300 MG capsule Take 300 mg by mouth 2 (two) times daily.     prochlorperazine  (COMPAZINE ) 10 MG tablet Take 1 tablet (10 mg total) by mouth every 6 (six) hours as needed for nausea or vomiting (Nausea or vomiting). 30 tablet 1   rosuvastatin (CRESTOR) 20 MG tablet Take 20 mg by mouth daily.     spironolactone (ALDACTONE) 25 MG tablet Take 12.5 mg by mouth daily.     tamsulosin (FLOMAX) 0.4 MG CAPS capsule Take 0.4 mg by mouth daily after supper.     umeclidinium-vilanterol (ANORO ELLIPTA ) 62.5-25 MCG/ACT AEPB Inhale 1 puff into the lungs daily. 30 each 11   valsartan (DIOVAN) 320 MG tablet Take 320 mg by mouth daily.     No current facility-administered medications for this visit.    Review of Systems  Constitutional:  Negative for appetite change, chills, fatigue, fever and unexpected weight change.  HENT:   Negative for hearing loss and voice change.   Eyes:  Negative for eye problems and icterus.  Respiratory:  Negative for chest tightness, cough and shortness of breath.   Cardiovascular:  Negative for chest pain and leg swelling.  Gastrointestinal:  Negative for abdominal distention and abdominal pain.  Endocrine: Negative for hot flashes.  Genitourinary:  Negative for difficulty urinating, dysuria and frequency.   Musculoskeletal:  Positive for arthralgias.       Right shoulder pain with limited motion  Skin:  Negative for itching and rash.  Neurological:  Negative for light-headedness and numbness.  Hematological:  Negative  for adenopathy. Does not bruise/bleed easily.  Psychiatric/Behavioral:  Negative for confusion.      PHYSICAL EXAMINATION: ECOG PERFORMANCE STATUS: 0 - Asymptomatic  Vitals:   03/16/24 0926  BP: (!) 140/74  Pulse: 72  Resp: (!) 98  Temp: 97.6 F (36.4 C)   Filed Weights   03/16/24 0926  Weight: 183 lb 11.2 oz (83.3 kg)    Physical Exam Constitutional:      General: He is not in acute distress.    Appearance: He is not diaphoretic.  HENT:     Head: Normocephalic and atraumatic.  Eyes:     General: No scleral icterus. Cardiovascular:     Rate and Rhythm: Normal rate.  Pulmonary:     Effort: Pulmonary effort is normal. No respiratory distress.  Abdominal:     Tenderness: There is no abdominal tenderness.  Musculoskeletal:     Cervical back: Normal range of motion and neck supple.     Comments: Right shoulder limited range of motion.  Skin:    Findings: No erythema or rash.  Neurological:     Mental Status: He is alert and oriented to person, place, and time. Mental status is at baseline.     Motor: No abnormal muscle tone.  Psychiatric:        Mood and Affect: Mood and affect normal.      LABORATORY DATA:  I have reviewed the data as listed    Latest Ref Rng & Units 03/16/2024    9:20 AM 03/09/2024    8:43 AM 12/25/2023    7:44 AM  CBC  WBC 4.0 - 10.5 K/uL 4.8  7.1  9.7   Hemoglobin 13.0 - 17.0 g/dL 04.5  40.9  81.1   Hematocrit  39.0 - 52.0 % 40.6  44.2  43.3   Platelets 150 - 400 K/uL 96  150  194       Latest Ref Rng & Units 03/16/2024    9:19 AM 03/09/2024    8:43 AM 12/25/2023    7:44 AM  CMP  Glucose 70 - 99 mg/dL 409  811  914   BUN 8 - 23 mg/dL 24  21  12    Creatinine 0.61 - 1.24 mg/dL 7.82  9.56  2.13   Sodium 135 - 145 mmol/L 133  135  136   Potassium 3.5 - 5.1 mmol/L 5.0  3.7  3.8   Chloride 98 - 111 mmol/L 98  100  102   CO2 22 - 32 mmol/L 25  25  23    Calcium 8.9 - 10.3 mg/dL 8.2  8.6  8.6   Total Protein 6.5 - 8.1 g/dL 6.5  7.2  7.0    Total Bilirubin 0.0 - 1.2 mg/dL 1.4  1.0  0.7   Alkaline Phos 38 - 126 U/L 148  111  120   AST 15 - 41 U/L 65  24  36   ALT 0 - 44 U/L 90  24  32      RADIOGRAPHIC STUDIES: I have personally reviewed the radiological images as listed and agreed with the findings in the report. MR Brain W Wo Contrast Result Date: 03/09/2024 CLINICAL DATA:  Brain lesion follow-up EXAM: MRI HEAD WITHOUT AND WITH CONTRAST TECHNIQUE: Multiplanar, multiecho pulse sequences of the brain and surrounding structures were obtained without and with intravenous contrast. CONTRAST:  7.5mL GADAVIST  GADOBUTROL  1 MMOL/ML IV SOLN COMPARISON:  01/08/2024 FINDINGS: Brain: Enhancement at the left basal ganglia on prior has regressed and is even more faint an wispy, likely vessel or prior small vessel insult. No indication of metastatic disease today. Mild chronic small vessel ischemia. Small chronic left superior frontal cortex infarct. No hydrocephalus, collection, or atrophy. Vascular: Normal flow voids. Skull and upper cervical spine: Normal marrow signal. C3 and C4 ACDF at least. Severe C1-2 facet degeneration eccentric to the right Sinuses/Orbits: Unremarkable IMPRESSION: Regression of nodular enhancement at the left basal ganglia, likely vessel or recent small vessel infarct. No indication of metastatic disease on today's scan. Electronically Signed   By: Ronnette Coke M.D.   On: 03/09/2024 08:38

## 2024-03-16 NOTE — Assessment & Plan Note (Addendum)
 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 Not a surgical candidate.  S/p SBRT.  Recommend adjuvant carboplatin  and Etoposide  for 4 cycles. .  Labs are reviewed and discussed with patient. S/p cycle 1 D1carboplatin and D1-3 Etoposide  with GCSF on D5. Recommend claritin 10mg  daily x 4 days.  Overall he tolerates treatment well.   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  He is on Aspirin 325mg  daily and Statin.

## 2024-03-16 NOTE — Assessment & Plan Note (Signed)
 Previously have diet controlled DM.  Glucose level in 300s. Partially due to steroid use.  Recommend patient to start diabetic diet. Refer to nutritionist for education.  Recommend patient to discuss with PCP regarding need of starting DM treatments.

## 2024-03-16 NOTE — Progress Notes (Signed)
 Pt will not receive IVF today.

## 2024-03-16 NOTE — Assessment & Plan Note (Signed)
 Previously compensated. Transaminitis/hyperbilirubinemia can be due to recent chemotherapy.  He has US  abdomen scheduled on 5/5 Recommend patient continue follow-up with GI

## 2024-03-22 ENCOUNTER — Other Ambulatory Visit: Payer: Self-pay

## 2024-03-22 ENCOUNTER — Ambulatory Visit
Admission: RE | Admit: 2024-03-22 | Discharge: 2024-03-22 | Disposition: A | Discharge status: Home / Self Care | Source: Ambulatory Visit | Attending: Radiation Oncology | Admitting: Radiation Oncology

## 2024-03-22 ENCOUNTER — Encounter: Payer: Self-pay | Admitting: Radiation Oncology

## 2024-03-22 ENCOUNTER — Telehealth: Payer: Self-pay | Admitting: *Deleted

## 2024-03-22 VITALS — BP 131/83 | HR 75 | Ht 71.0 in | Wt 182.5 lb

## 2024-03-22 DIAGNOSIS — C3412 Malignant neoplasm of upper lobe, left bronchus or lung: Secondary | ICD-10-CM | POA: Diagnosis not present

## 2024-03-22 MED ORDER — NYSTATIN 100000 UNIT/ML MT SUSP
5.0000 mL | Freq: Three times a day (TID) | OROMUCOSAL | 1 refills | Status: DC | PRN
  Filled 2024-03-22: qty 300, 20d supply, fill #0
  Filled 2024-04-27: qty 300, 20d supply, fill #1

## 2024-03-22 NOTE — Progress Notes (Signed)
 Radiation Oncology Follow up Note  Name: [name]   Date:   03/22/2024 MRN:  [id number] DOB: 10/08/1956    This 68 y.o. male presents to the clinic today for 1 month follow-up status post SBRT for stage I non-small cell lung cancer of the left upper lobe.  REFERRING PROVIDER: Aloha Arnold, PA-C  HPI: Patient is a 68 year old male now out 1 month having completed SBRT to his left upper lobe for stage I non-small cell lung cancer.  Seen today in follow-up he is doing fairly well still has a productive cough clear.  He states he has not had no change in his significant pulmonary status.  No hemoptysis no chest tightness..  Patient did have an MRI scan to evaluate enhancement of left basal ganglia showing regression of the nodular enhancement no indication of metastatic disease.  COMPLICATIONS OF TREATMENT: none  FOLLOW UP COMPLIANCE: keeps appointments   PHYSICAL EXAM:  BP 131/83   Pulse 75   Ht 5\' 11"  (1.803 m)   Wt 182 lb 8 oz (82.8 kg)   BMI 25.45 kg/m  Well-developed well-nourished patient in NAD. HEENT reveals PERLA, EOMI, discs not visualized.  Oral cavity is clear. No oral mucosal lesions are identified. Neck is clear without evidence of cervical or supraclavicular adenopathy. Lungs are clear to A&P. Cardiac examination is essentially unremarkable with regular rate and rhythm without murmur rub or thrill. Abdomen is benign with no organomegaly or masses noted. Motor sensory and DTR levels are equal and symmetric in the upper and lower extremities. Cranial nerves II through XII are grossly intact. Proprioception is intact. No peripheral adenopathy or edema is identified. No motor or sensory levels are noted. Crude visual fields are within normal range.  RADIOLOGY RESULTS: MRI scan reviewed  PLAN: The present time patient is doing well very low side effect profile from his SBRT does have a cough which we expect at this time.  I have asked to see him back in 3 months with a  follow-up CT scan of the chest at that time.  Patient knows to call with any concerns.  I would like to take this opportunity to thank you for allowing me to participate in the care of your patient.Glenis Langdon, MD

## 2024-03-22 NOTE — Telephone Encounter (Signed)
 Pt's daughter stated that pt has started to experience mouth sores and is requesting prescription for medicated mouthwash. Per Dr. Wilhelmenia Harada, may send in magic mouthwash with lidocaine . Prescription sent to [facility] community pharmacy. Pt's daughter made aware. Nothing further needed at this time.

## 2024-03-24 ENCOUNTER — Other Ambulatory Visit: Payer: Self-pay

## 2024-03-26 ENCOUNTER — Encounter: Payer: Self-pay | Admitting: Oncology

## 2024-03-29 ENCOUNTER — Ambulatory Visit
Admission: RE | Admit: 2024-03-29 | Discharge: 2024-03-29 | Disposition: A | Discharge status: Home / Self Care | Source: Ambulatory Visit | Attending: Gastroenterology | Admitting: Gastroenterology

## 2024-03-29 DIAGNOSIS — K7689 Other specified diseases of liver: Secondary | ICD-10-CM | POA: Diagnosis not present

## 2024-03-29 DIAGNOSIS — K7460 Unspecified cirrhosis of liver: Secondary | ICD-10-CM | POA: Diagnosis not present

## 2024-03-29 MED FILL — Fosaprepitant Dimeglumine For IV Infusion 150 MG (Base Eq): INTRAVENOUS | Qty: 5 | Status: AC

## 2024-03-30 ENCOUNTER — Inpatient Hospital Stay (HOSPITAL_BASED_OUTPATIENT_CLINIC_OR_DEPARTMENT_OTHER): Admitting: Oncology

## 2024-03-30 ENCOUNTER — Inpatient Hospital Stay: Attending: Oncology

## 2024-03-30 ENCOUNTER — Inpatient Hospital Stay

## 2024-03-30 ENCOUNTER — Encounter: Payer: Self-pay | Admitting: Oncology

## 2024-03-30 VITALS — BP 126/68 | HR 65 | Temp 96.0°F | Resp 19

## 2024-03-30 VITALS — BP 121/77 | HR 62 | Temp 97.4°F | Resp 18 | Wt 181.8 lb

## 2024-03-30 DIAGNOSIS — Z7982 Long term (current) use of aspirin: Secondary | ICD-10-CM | POA: Insufficient documentation

## 2024-03-30 DIAGNOSIS — C7A1 Malignant poorly differentiated neuroendocrine tumors: Secondary | ICD-10-CM

## 2024-03-30 DIAGNOSIS — Z923 Personal history of irradiation: Secondary | ICD-10-CM | POA: Insufficient documentation

## 2024-03-30 DIAGNOSIS — Z5111 Encounter for antineoplastic chemotherapy: Secondary | ICD-10-CM | POA: Insufficient documentation

## 2024-03-30 DIAGNOSIS — Z5189 Encounter for other specified aftercare: Secondary | ICD-10-CM | POA: Diagnosis not present

## 2024-03-30 DIAGNOSIS — G4700 Insomnia, unspecified: Secondary | ICD-10-CM

## 2024-03-30 DIAGNOSIS — F17210 Nicotine dependence, cigarettes, uncomplicated: Secondary | ICD-10-CM | POA: Diagnosis not present

## 2024-03-30 DIAGNOSIS — E1165 Type 2 diabetes mellitus with hyperglycemia: Secondary | ICD-10-CM | POA: Diagnosis not present

## 2024-03-30 LAB — CBC WITH DIFFERENTIAL (CANCER CENTER ONLY)
Abs Immature Granulocytes: 0.23 10*3/uL — ABNORMAL HIGH (ref 0.00–0.07)
Basophils Absolute: 0.1 10*3/uL (ref 0.0–0.1)
Basophils Relative: 1 %
Eosinophils Absolute: 0.1 10*3/uL (ref 0.0–0.5)
Eosinophils Relative: 1 %
HCT: 40.3 % (ref 39.0–52.0)
Hemoglobin: 13.4 g/dL (ref 13.0–17.0)
Immature Granulocytes: 2 %
Lymphocytes Relative: 15 %
Lymphs Abs: 1.6 10*3/uL (ref 0.7–4.0)
MCH: 31.8 pg (ref 26.0–34.0)
MCHC: 33.3 g/dL (ref 30.0–36.0)
MCV: 95.7 fL (ref 80.0–100.0)
Monocytes Absolute: 0.6 10*3/uL (ref 0.1–1.0)
Monocytes Relative: 6 %
Neutro Abs: 8.4 10*3/uL — ABNORMAL HIGH (ref 1.7–7.7)
Neutrophils Relative %: 75 %
Platelet Count: 182 10*3/uL (ref 150–400)
RBC: 4.21 MIL/uL — ABNORMAL LOW (ref 4.22–5.81)
RDW: 13.2 % (ref 11.5–15.5)
WBC Count: 11 10*3/uL — ABNORMAL HIGH (ref 4.0–10.5)
nRBC: 0 % (ref 0.0–0.2)

## 2024-03-30 LAB — CMP (CANCER CENTER ONLY)
ALT: 46 U/L — ABNORMAL HIGH (ref 0–44)
AST: 31 U/L (ref 15–41)
Albumin: 4 g/dL (ref 3.5–5.0)
Alkaline Phosphatase: 181 U/L — ABNORMAL HIGH (ref 38–126)
Anion gap: 11 (ref 5–15)
BUN: 15 mg/dL (ref 8–23)
CO2: 24 mmol/L (ref 22–32)
Calcium: 8.9 mg/dL (ref 8.9–10.3)
Chloride: 101 mmol/L (ref 98–111)
Creatinine: 1.13 mg/dL (ref 0.61–1.24)
GFR, Estimated: >60 mL/min (ref >60)
Glucose, Bld: 286 mg/dL — ABNORMAL HIGH (ref 70–99)
Potassium: 4.1 mmol/L (ref 3.5–5.1)
Sodium: 136 mmol/L (ref 135–145)
Total Bilirubin: 0.7 mg/dL (ref 0.0–1.2)
Total Protein: 7.4 g/dL (ref 6.5–8.1)

## 2024-03-30 MED ORDER — DEXAMETHASONE SODIUM PHOSPHATE 10 MG/ML IJ SOLN
10.0000 mg | Freq: Once | INTRAMUSCULAR | Status: AC
  Administered 2024-03-30: 10 mg via INTRAVENOUS
  Filled 2024-03-30: qty 1

## 2024-03-30 MED ORDER — SODIUM CHLORIDE 0.9 % IV SOLN
505.0000 mg | Freq: Once | INTRAVENOUS | Status: AC
  Administered 2024-03-30: 510 mg via INTRAVENOUS
  Filled 2024-03-30: qty 51

## 2024-03-30 MED ORDER — PALONOSETRON HCL INJECTION 0.25 MG/5ML
0.2500 mg | Freq: Once | INTRAVENOUS | Status: AC
  Administered 2024-03-30: 0.25 mg via INTRAVENOUS
  Filled 2024-03-30: qty 5

## 2024-03-30 MED ORDER — SODIUM CHLORIDE 0.9 % IV SOLN
150.0000 mg | Freq: Once | INTRAVENOUS | Status: AC
  Administered 2024-03-30: 150 mg via INTRAVENOUS
  Filled 2024-03-30: qty 150

## 2024-03-30 MED ORDER — SODIUM CHLORIDE 0.9 % IV SOLN
100.0000 mg/m2 | Freq: Once | INTRAVENOUS | Status: AC
  Administered 2024-03-30: 200 mg via INTRAVENOUS
  Filled 2024-03-30: qty 10

## 2024-03-30 MED ORDER — SODIUM CHLORIDE 0.9 % IV SOLN
INTRAVENOUS | Status: DC
Start: 2024-03-30 — End: 2024-03-30
  Filled 2024-03-30: qty 250

## 2024-03-30 NOTE — Progress Notes (Signed)
 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 Not a surgical candidate.  S/p SBRT.  Recommend adjuvant carboplatin  and Etoposide  for 4 cycles. .  Labs are reviewed and discussed with patient. Proceed with Cytcle 2 D1carboplatin and D1-3 Etoposide  with GCSF on D5. Recommend claritin 10mg  daily x 4 days.    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  He is on Aspirin 325mg  daily and Statin.    Encounter for antineoplastic chemotherapy Chemotherapy plan as listed above  Hyperglycemia due to diabetes mellitus (HCC) Previously have diet controlled DM.  Glucose level in 300s. Partially due to steroid use.  Recommend patient to start diabetic diet. Refer to nutritionist for education.  Recommend patient to discuss with PCP regarding need of starting DM treatments.   Insomnia Recommend otc melatonin 5mg  at bedtime PRN.  Recommend patient to decrease caffeine intake   Orders Placed This Encounter  Procedures   CBC with Differential (Cancer Center Only)    Standing Status:   Future    Expected Date:   04/20/2024    Expiration Date:   04/20/2025   CMP (Cancer Center only)    Standing Status:   Future    Expected Date:   04/20/2024    Expiration Date:   04/20/2025   CBC with Differential (Cancer Center Only)    Standing Status:   Future    Expected Date:   05/11/2024    Expiration Date:   05/11/2025   CMP (Cancer Center only)    Standing Status:   Future    Expected Date:   05/11/2024    Expiration Date:   05/11/2025   Follow-up 3 weeks  All questions were answered. The patient knows to call the clinic with any problems, questions or concerns.  [name], MD, PhD [facility] Hematology Oncology 03/30/2024    HISTORY OF PRESENTING ILLNESS:  [name] 68 y.o. 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 I have reviewed his chart and materials related to his cancer extensively and collaborated history with the patient. Summary of oncologic history is as follows: Oncology History  High grade neuroendocrine carcinoma of lung (HCC)  10/28/2023 Imaging   PET scan showed 1. Intense metabolic activity associated with a LEFT upper lobe pulmonary nodule consistent primary bronchogenic carcinoma. 2. No evidence of metastatic adenopathy in the mediastinum. No distant metastatic disease. 3. Intense metabolic activity associated with the RIGHT shoulder versus arthroplasty. Recommend correlation for loosening or infection. 4. Cirrhotic morphology of the liver. 5. Post CABG.   11/25/2023 Imaging   CT lung nodule   1. Enlarging microlobulated lingular nodule, now 15.9 mm in size. Lung-RADS 4B, suspicious. Additional imaging evaluation or consultation with Pulmonology or Thoracic Surgery recommended. These results will be called to the ordering clinician or representative by the Radiologist Assistant, and communication documented in the PACS or [employer]. 2. New 4.4 cm lingular nodule.  Recommend attention on follow-up. 3. Cirrhosis. 4.  Aortic atherosclerosis (ICD10-I70.0). 5.  Emphysema    01/06/2024 Initial Diagnosis   High grade neuroendocrine carcinoma of lung   Patient was evaluated by pulmonology and underwent biopsy via bronchoscopy. A.  LEFT LUNG,  UPPER LOBE, NODULE, FINE NEEDLE ASPIRATION  BIOPSY:  - Malignant  - Non-small cell carcinoma (see comment)   B.  LEFT LUNG, UPPER LOBE, NODULE, BRUSHING:  - Malignant  - Non-small cell carcinoma (see comment)   ADDENDUM 1 :  Immunohistochemistry is performed to classify the carcinoma.  The tumor cells are negative  with TTF-1 and p40.  Additional  immunohistochemistry will be performed and reported in a subsequent  addendum.   ADDENDUM 2: Additional immunohistochemistry is performed to better  characterize the carcinoma and the malignant cells are positive with  synaptophysin and negative with cytokeratin 5/6, CD56 and chromogranin.  Ki-67 shows a moderately increased proliferation rate.  The presence of  synaptophysin positivity is most consistent with intermediate to  high-grade neuroendocrine carcinoma   -- I discussed with pathologist and confirmed the large cell neuroendocrine carcinoma histology.  D. LYMPH NODE, STATION 7, FINE NEEDLE ASPIRATION:  - Negative for malignancy  - Compatible with a benign lymph node   E. LYMPH NODE, STATION 11L, FINE NEEDLE ASPIRATION:  - Negative for malignancy  - Compatible with a benign lymph node   F. LUNG, LUL, LAVAGE:   FINAL MICROSCOPIC DIAGNOSIS:  - No malignant cells identified  - Pulmonary macrophages and benign bronchial cells    01/06/2024 Cancer Staging   Staging form: Lung, AJCC V9 - Clinical: Stage IA2 (cT1b, cN0, cM0) - Signed by [name], MD on 01/06/2024   02/05/2024 - 02/17/2024 Radiation Therapy   SBRT x 5 treatment to lung    03/03/2024 Imaging   MRI brain w wo contrast  Regression of nodular enhancement at the left basal ganglia, likely vessel or recent small vessel infarct. No indication of metastatic disease on today's scan.   03/09/2024 -  Chemotherapy   Patient is on Treatment Plan : LUNG  Carboplatin  (AUC 5) D1 + Etoposide  (100) D1-3 q21d       Denies unintentional weight loss, hemoptysis headache double vision. He has a history of smoking, currently smoking about a pack of cigarettes per day.  Denies any alcohol use. He experiences ongoing pain and swelling in his right shoulder, where he had a reverse shoulder replacement approximately four to five years ago. The PET scan showed activity in this area, raising concerns about  possible infection or chronic inflammation. He has limited range of motion and is unable to lift his arm beyond a certain point. He was seen by cardiology recently was cleared to proceed with surgery.  However due to decreased pulmonary function, he was felt not a surgical candidate. Patient presents to discuss nonsurgical alternative treatments.  He has met with radiation oncology Dr. Chrystal and has been offered SBRT.  INTERVAL HISTORY [name] is a 68 y.o. 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  Patient was accompanied by daughter today. Tolerated cycle 1 Carboplatin  Etoposide  Today he has no new complaints. Denies nausea, vomiting, diarrhea.  His appetite is good and patient's weight is stable.     MEDICAL HISTORY:  Past Medical History:  Diagnosis Date   Anemia    Anginal pain (HCC)    Arthritis    Atherosclerotic peripheral vascular disease with intermittent claudication (HCC)    Bronchitis    h/o   CHF (congestive heart failure) (HCC)    Cirrhosis, non-alcoholic (HCC)    Coronary artery disease    Depression    Diabetes mellitus  without complication (HCC)    diet controlled   Difficult intubation    Pt Denies   Dyspnea    with exertion   GERD (gastroesophageal reflux disease)    Heart failure with mid-range ejection fraction (HFmEF) (HCC)    History of MRSA infection 2010   Hx of CABG    Hyperlipidemia    Hypertension    Insomnia    Ischemic cardiomyopathy    Liver cirrhosis (HCC)    Peripheral vascular disease (HCC)    Stomach ulcer    Stomach ulcer    Wears hearing aid in both ears     SURGICAL HISTORY: Past Surgical History:  Procedure Laterality Date   ANTERIOR CERVICAL DECOMP/DISCECTOMY FUSION N/A 01/28/2018   Procedure: ANTERIOR CERVICAL DECOMPRESSION/DISCECTOMY FUSION 2 LEVELS C3-5;  Surgeon: Jodeen Munch, MD;  Location: [facility] ORS;   Service: Neurosurgery;  Laterality: N/A;   BRONCHIAL BIOPSY  12/25/2023   Procedure: BRONCHIAL BIOPSIES;  Surgeon: Vergia Glasgow, MD;  Location: [facility] END[facility];  Service: Pulmonary;;   BRONCHIAL BRUSHINGS  12/25/2023   Procedure: BRONCHIAL BRUSHINGS;  Surgeon: Vergia Glasgow, MD;  Location: [facility] ENDOSCOPY;  Service: Pulmonary;;   BRONCHIAL NEEDLE ASPIRATION BIOPSY  12/25/2023   Procedure: BRONCHIAL NEEDLE ASPIRATION BIOPSIES;  Surgeon: Vergia Glasgow, MD;  Location: [facility] ENDOSCOPY;  Service: Pulmonary;;   BRONCHIAL WASHINGS  12/25/2023   Procedure: BRONCHIAL WASHINGS;  Surgeon: Vergia Glasgow, MD;  Location: [facility] ENDOSCOPY;  Service: Pulmonary;;   COLON SURGERY  2004   due to diverticulitis.  Partail colectomy with Anastamosis   COLONOSCOPY N/A 03/27/2023   Procedure: COLONOSCOPY;  Surgeon: Selena Daily, MD;  Location: [facility] ENDOSCOPY;  Service: Gastroenterology;  Laterality: N/A;   COLONOSCOPY WITH PROPOFOL  N/A 03/26/2023   Procedure: COLONOSCOPY WITH PROPOFOL ;  Surgeon: Selena Daily, MD;  Location: [facility] ENDOSCOPY;  Service: Gastroenterology;  Laterality: N/A;   CORONARY ARTERY BYPASS GRAFT     ESOPHAGOGASTRODUODENOSCOPY (EGD) WITH PROPOFOL  N/A 03/26/2023   Procedure: ESOPHAGOGASTRODUODENOSCOPY (EGD) WITH PROPOFOL ;  Surgeon: Selena Daily, MD;  Location: [facility] ENDOSCOPY;  Service: Gastroenterology;  Laterality: N/A;   FINE NEEDLE ASPIRATION  12/25/2023   Procedure: FINE NEEDLE ASPIRATION (FNA) LINEAR;  Surgeon: Vergia Glasgow, MD;  Location: [facility] ENDOSCOPY;  Service: Pulmonary;;   HAND TENDON SURGERY Right    right thumb   LUMBAR LAMINECTOMY/DECOMPRESSION MICRODISCECTOMY N/A 09/01/2019   Procedure: LUMBAR LAMINECTOMY/DECOMPRESSION MICRODISCECTOMY 3 LEVELS L2-L5;  Surgeon: Jodeen Munch, MD;  Location: [facility] ORS;  Service: Neurosurgery;  Laterality: N/A;   neck fusion     SHOULDER ARTHROSCOPY WITH OPEN ROTATOR CUFF REPAIR Right 04/08/2016   Procedure: SHOULDER ARTHROSCOPY WITH  OPEN ROTATOR CUFF REPAIR;  Surgeon: Marlynn Singer, MD;  Location: [facility] ORS;  Service: Orthopedics;  Laterality: Right;   TONSILLECTOMY     TRANSFORAMINAL LUMBAR INTERBODY FUSION W/ MIS 1 LEVEL N/A 09/01/2019   Procedure: MINIMALLY INVASIVE (MIS) TRANSFORAMINAL LUMBAR INTERBODY FUSION (TLIF) 1 LEVEL L5-S1;  Surgeon: Jodeen Munch, MD;  Location: [facility] ORS;  Service: Neurosurgery;  Laterality: N/A;   TYMPANOPLASTY Right 08/19/2023   Procedure: TYMPANOPLASTY WITH REMOVAL OF CHOLESTEATOMA;  Surgeon: Von Grumbling, MD;  Location: [facility];  Service: ENT;  Laterality: Right;   VIDEO BRONCHOSCOPY WITH ENDOBRONCHIAL ULTRASOUND  12/25/2023   Procedure: VIDEO BRONCHOSCOPY WITH ENDOBRONCHIAL ULTRASOUND;  Surgeon: Vergia Glasgow, MD;  Location: [facility] ENDOSCOPY;  Service: Pulmonary;;   VIDEO BRONCHOSCOPY WITH RADIAL ENDOBRONCHIAL ULTRASOUND  12/25/2023   Procedure: VIDEO BRONCHOSCOPY WITH RADIAL ENDOBRONCHIAL ULTRASOUND;  Surgeon: Vergia Glasgow, MD;  Location: [facility] ENDOSCOPY;  Service: Pulmonary;;    SOCIAL HISTORY: Social History   Socioeconomic History   Marital status: Married    Spouse name: Not on file   Number of children: Not on file   Years of education: Not on file   Highest education level: Not on file  Occupational History   Not on file  Tobacco Use   Smoking status: Every Day    Current packs/day: 1.00    Average packs/day: 1 pack/day for 50.4 years (50.4 ttl pk-yrs)    Types: Cigarettes    Start date: 02/08/1968    Last attempt to quit: 02/07/2018   Smokeless tobacco: Never   Tobacco comments:    1PPD khj 01/07/2024  Vaping Use   Vaping status: Former  Substance and Sexual Activity   Alcohol use: Not Currently    Comment: rarely   Drug use: No   Sexual activity: Yes  Other Topics Concern   Not on file  Social History Narrative   Not on file   Social Drivers of Health   Financial Resource Strain: Not on file  Food Insecurity: No Food Insecurity (01/06/2024)    Hunger Vital Sign    Worried About Running Out of Food in the Last Year: Never true    Ran Out of Food in the Last Year: Never true  Transportation Needs: No Transportation Needs (01/06/2024)   PRAPARE - Administrator, Civil Service (Medical): No    Lack of Transportation (Non-Medical): No  Physical Activity: Not on file  Stress: No Stress Concern Present (01/06/2024)   [employer] of Occupational Health - Occupational Stress Questionnaire    Feeling of Stress : Not at all  Social Connections: Not on file  Intimate Partner Violence: Not At Risk (01/06/2024)   Humiliation, Afraid, Rape, and Kick questionnaire    Fear of Current or Ex-Partner: No    Emotionally Abused: No    Physically Abused: No    Sexually Abused: No    FAMILY HISTORY: History reviewed. No pertinent family history.  ALLERGIES:  is allergic to penicillins and atorvastatin.  MEDICATIONS:  Current Outpatient Medications  Medication Sig Dispense Refill   albuterol  (VENTOLIN  HFA) 108 (90 Base) MCG/ACT inhaler Inhale 1-2 puffs into the lungs every 6 (six) hours as needed for wheezing or shortness of breath.     amitriptyline  (ELAVIL ) 50 MG tablet Take 50 mg by mouth at bedtime.     aspirin 325 MG tablet Take 325 mg by mouth daily.     ciprofloxacin -dexamethasone  (CIPRODEX ) OTIC suspension Place 4 drops into both ears 2 (two) times daily for 5 days. DOS 08/19/23. 7.5 mL 0   citalopram  (CELEXA ) 40 MG tablet Take 40 mg by mouth every morning.      dexamethasone  (DECADRON ) 4 MG tablet Take 2 tablets daily for 2 days, on days 4 and 5.Take with food. Every 21 days. 30 tablet 1   DULoxetine (CYMBALTA) 60 MG capsule Take 60 mg by mouth 2 (two) times daily.     fluticasone (FLONASE) 50 MCG/ACT nasal spray Place 2 sprays into both nostrils daily.     isosorbide mononitrate (IMDUR) 30 MG 24 hr tablet Take 30 mg by mouth daily.     lansoprazole (PREVACID) 15 MG capsule Take 15 mg by mouth daily at 12 noon.     magic  mouthwash (nystatin , lidocaine , diphenhydrAMINE, alum & mag hydroxide) suspension Take 5 mLs by mouth 3 (three) times daily as needed for mouth pain. 300 mL 1  meloxicam  (MOBIC ) 15 MG tablet Take 15 mg by mouth every morning.     nitroGLYCERIN  (NITROSTAT ) 0.4 MG SL tablet Place 0.4 mg under the tongue every 5 (five) minutes as needed for chest pain.     ofloxacin  (OCUFLOX ) 0.3 % ophthalmic solution Place 4 drops into the left ear 2 (two) times daily. 5 mL 5   omeprazole  (PRILOSEC ) 20 MG capsule Take 20 mg by mouth daily.     ondansetron  (ZOFRAN ) 8 MG tablet Take 1 tablet (8 mg total) by mouth every 8 (eight) hours as needed for nausea or vomiting. Start on third day after chemotherapy. 30 tablet 1   pregabalin (LYRICA) 300 MG capsule Take 300 mg by mouth 2 (two) times daily.     prochlorperazine  (COMPAZINE ) 10 MG tablet Take 1 tablet (10 mg total) by mouth every 6 (six) hours as needed for nausea or vomiting (Nausea or vomiting). 30 tablet 1   rosuvastatin (CRESTOR) 20 MG tablet Take 20 mg by mouth daily.     spironolactone (ALDACTONE) 25 MG tablet Take 12.5 mg by mouth daily.     tamsulosin (FLOMAX) 0.4 MG CAPS capsule Take 0.4 mg by mouth daily after supper.     umeclidinium-vilanterol (ANORO ELLIPTA ) 62.5-25 MCG/ACT AEPB Inhale 1 puff into the lungs daily. 30 each 11   valsartan (DIOVAN) 320 MG tablet Take 320 mg by mouth daily.     No current facility-administered medications for this visit.   Facility-Administered Medications Ordered in Other Visits  Medication Dose Route Frequency Provider Last Rate Last Admin   0.9 %  sodium chloride  infusion   Intravenous Continuous [name], MD 10 mL/hr at 03/30/24 0927 New Bag at 03/30/24 1610   CARBOplatin  (PARAPLATIN ) 510 mg in sodium chloride  0.9 % 250 mL chemo infusion  510 mg Intravenous Once [name], MD       dexamethasone  (DECADRON ) injection 10 mg  10 mg Intravenous Once [name], MD       etoposide  (VEPESID ) 200 mg in sodium chloride  0.9 %  500 mL chemo infusion  100 mg/m2 (Treatment Plan Recorded) Intravenous Once [name], MD        Review of Systems  Constitutional:  Negative for appetite change, chills, fatigue, fever and unexpected weight change.  HENT:   Negative for hearing loss and voice change.   Eyes:  Negative for eye problems and icterus.  Respiratory:  Negative for chest tightness, cough and shortness of breath.   Cardiovascular:  Negative for chest pain and leg swelling.  Gastrointestinal:  Negative for abdominal distention and abdominal pain.  Endocrine: Negative for hot flashes.  Genitourinary:  Negative for difficulty urinating, dysuria and frequency.   Musculoskeletal:  Positive for arthralgias.       Right shoulder pain with limited motion  Skin:  Negative for itching and rash.  Neurological:  Negative for light-headedness and numbness.  Hematological:  Negative for adenopathy. Does not bruise/bleed easily.  Psychiatric/Behavioral:  Negative for confusion.      PHYSICAL EXAMINATION: ECOG PERFORMANCE STATUS: 0 - Asymptomatic  Vitals:   03/30/24 0838 03/30/24 0852  BP: (!) 152/84 121/77  Pulse: 62   Resp: 18   Temp: (!) 97.4 F (36.3 C)   SpO2: 98%    Filed Weights   03/30/24 0838  Weight: 181 lb 12.8 oz (82.5 kg)    Physical Exam Constitutional:      General: He is not in acute distress.    Appearance: He is not diaphoretic.  HENT:  Head: Normocephalic and atraumatic.  Eyes:     General: No scleral icterus. Cardiovascular:     Rate and Rhythm: Normal rate.  Pulmonary:     Effort: Pulmonary effort is normal. No respiratory distress.  Abdominal:     Tenderness: There is no abdominal tenderness.  Musculoskeletal:     Cervical back: Normal range of motion and neck supple.     Comments: Right shoulder limited range of motion.  Skin:    Findings: No erythema or rash.  Neurological:     Mental Status: He is alert and oriented to person, place, and time. Mental status is at  baseline.     Motor: No abnormal muscle tone.  Psychiatric:        Mood and Affect: Mood and affect normal.      LABORATORY DATA:  I have reviewed the data as listed    Latest Ref Rng & Units 03/30/2024    8:16 AM 03/16/2024    9:20 AM 03/09/2024    8:43 AM  CBC  WBC 4.0 - 10.5 K/uL 11.0  4.8  7.1   Hemoglobin 13.0 - 17.0 g/dL 40.9  81.1  91.4   Hematocrit 39.0 - 52.0 % 40.3  40.6  44.2   Platelets 150 - 400 K/uL 182  96  150       Latest Ref Rng & Units 03/30/2024    8:16 AM 03/16/2024    9:19 AM 03/09/2024    8:43 AM  CMP  Glucose 70 - 99 mg/dL 782  956  213   BUN 8 - 23 mg/dL 15  24  21    Creatinine 0.61 - 1.24 mg/dL 0.86  5.78  4.69   Sodium 135 - 145 mmol/L 136  133  135   Potassium 3.5 - 5.1 mmol/L 4.1  5.0  3.7   Chloride 98 - 111 mmol/L 101  98  100   CO2 22 - 32 mmol/L 24  25  25    Calcium 8.9 - 10.3 mg/dL 8.9  8.2  8.6   Total Protein 6.5 - 8.1 g/dL 7.4  6.5  7.2   Total Bilirubin 0.0 - 1.2 mg/dL 0.7  1.4  1.0   Alkaline Phos 38 - 126 U/L 181  148  111   AST 15 - 41 U/L 31  65  24   ALT 0 - 44 U/L 46  90  24      RADIOGRAPHIC STUDIES: I have personally reviewed the radiological images as listed and agreed with the findings in the report. US  ABDOMEN LIMITED RUQ (LIVER/GB) Result Date: 03/29/2024 CLINICAL DATA:  cirrhosis of liver EXAM: ULTRASOUND ABDOMEN LIMITED RIGHT UPPER QUADRANT COMPARISON:  September 25, 2023 FINDINGS: Gallbladder: No gallstones or wall thickening visualized. No sonographic Murphy sign noted by sonographer. Common bile duct: Diameter: Visualized portion measures 2 mm, within normal limits. Liver: No focal lesion identified. Coarsened in parenchymal echogenicity. Nodular liver contour. Portal vein is patent on color Doppler imaging with normal direction of blood flow towards the liver. Other: None. IMPRESSION: Cirrhotic liver morphology. No focal lesion identified. Electronically Signed   By: Clancy Crimes M.D.   On: 03/29/2024 09:07   MR Brain W  Wo Contrast Result Date: 03/09/2024 CLINICAL DATA:  Brain lesion follow-up EXAM: MRI HEAD WITHOUT AND WITH CONTRAST TECHNIQUE: Multiplanar, multiecho pulse sequences of the brain and surrounding structures were obtained without and with intravenous contrast. CONTRAST:  7.5mL GADAVIST  GADOBUTROL  1 MMOL/ML IV SOLN COMPARISON:  01/08/2024 FINDINGS: Brain:  Enhancement at the left basal ganglia on prior has regressed and is even more faint an wispy, likely vessel or prior small vessel insult. No indication of metastatic disease today. Mild chronic small vessel ischemia. Small chronic left superior frontal cortex infarct. No hydrocephalus, collection, or atrophy. Vascular: Normal flow voids. Skull and upper cervical spine: Normal marrow signal. C3 and C4 ACDF at least. Severe C1-2 facet degeneration eccentric to the right Sinuses/Orbits: Unremarkable IMPRESSION: Regression of nodular enhancement at the left basal ganglia, likely vessel or recent small vessel infarct. No indication of metastatic disease on today's scan. Electronically Signed   By: Ronnette Coke M.D.   On: 03/09/2024 08:38

## 2024-03-30 NOTE — Assessment & Plan Note (Signed)
 Previously have diet controlled DM.  Glucose level in 300s. Partially due to steroid use.  Recommend patient to start diabetic diet. Refer to nutritionist for education.  Recommend patient to discuss with PCP regarding need of starting DM treatments.

## 2024-03-30 NOTE — Assessment & Plan Note (Addendum)
 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 Not a surgical candidate.  S/p SBRT.  Recommend adjuvant carboplatin  and Etoposide  for 4 cycles. .  Labs are reviewed and discussed with patient. Proceed with Cytcle 2 D1carboplatin and D1-3 Etoposide  with GCSF on D5. Recommend claritin 10mg  daily x 4 days.    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  He is on Aspirin 325mg  daily and Statin.

## 2024-03-30 NOTE — Patient Instructions (Signed)
 [facility] - A DEPT OF [facility]  Discharge Instructions: Thank you for choosing [facility] to provide your oncology and hematology care.  If you have a lab appointment with the Cancer Center, please go directly to the Cancer Center and check in at the registration area.  Wear comfortable clothing and clothing appropriate for easy access to any Portacath or PICC line.   We strive to give you quality time with your provider. You may need to reschedule your appointment if you arrive late (15 or more minutes).  Arriving late affects you and other patients whose appointments are after yours.  Also, if you miss three or more appointments without notifying the office, you may be dismissed from the clinic at the provider's discretion.      For prescription refill requests, have your pharmacy contact our office and allow 72 hours for refills to be completed.    Today you received the following chemotherapy and/or immunotherapy agents carboplatin and etoposide      To help prevent nausea and vomiting after your treatment, we encourage you to take your nausea medication as directed.  BELOW ARE SYMPTOMS THAT SHOULD BE REPORTED IMMEDIATELY: *FEVER GREATER THAN 100.4 F (38 C) OR HIGHER *CHILLS OR SWEATING *NAUSEA AND VOMITING THAT IS NOT CONTROLLED WITH YOUR NAUSEA MEDICATION *UNUSUAL SHORTNESS OF BREATH *UNUSUAL BRUISING OR BLEEDING *URINARY PROBLEMS (pain or burning when urinating, or frequent urination) *BOWEL PROBLEMS (unusual diarrhea, constipation, pain near the anus) TENDERNESS IN MOUTH AND THROAT WITH OR WITHOUT PRESENCE OF ULCERS (sore throat, sores in mouth, or a toothache) UNUSUAL RASH, SWELLING OR PAIN  UNUSUAL VAGINAL DISCHARGE OR ITCHING   Items with * indicate a potential emergency and should be followed up as soon as possible or go to the Emergency Department if any problems should occur.  Please show the CHEMOTHERAPY ALERT CARD or  IMMUNOTHERAPY ALERT CARD at check-in to the Emergency Department and triage nurse.  Should you have questions after your visit or need to cancel or reschedule your appointment, please contact [facility] - A DEPT OF [facility]  [phone] and follow the prompts.  Office hours are 8:00 a.m. to 4:30 p.m. Monday - Friday. Please note that voicemails left after 4:00 p.m. may not be returned until the following business day.  We are closed weekends and major holidays. You have access to a nurse at all times for urgent questions. Please call the main number to the clinic [phone] and follow the prompts.  For any non-urgent questions, you may also contact your provider using MyChart. We now offer e-Visits for anyone 70 and older to request care online for non-urgent symptoms. For details visit [email or web address].   Also download the MyChart app! Go to the app store, search "MyChart", open the app, select [facility], and log in with your MyChart username and password.

## 2024-03-30 NOTE — Assessment & Plan Note (Signed)
 Recommend otc melatonin 5mg  at bedtime PRN.  Recommend patient to decrease caffeine intake

## 2024-03-30 NOTE — Assessment & Plan Note (Signed)
 Chemotherapy plan as listed above

## 2024-03-30 NOTE — Progress Notes (Signed)
 Nutrition Follow-up:  Patient with lung cancer.  Patient receiving carboplatin  and etoposide .    Met with patient during infusion.  Reports that his appetite has been good.  Eating breakfast meat, eggs, biscuit for breakfast.  Lunch maybe hamburger steak and salad.  Supper maybe salad.  Drinking Mt dew and water.  Does not like ensure shakes but drinking some muscle milk.  Says that gum soreness is better after starting magic mouth wash.    Medications: MMW  Labs: glucose 286  Anthropometrics:   Weight 181 lb 12.8 oz today 183 lb 8 oz on 4/15 175-180 lb UBW   NUTRITION DIAGNOSIS: Food and nutrition related knowledge deficit stable    INTERVENTION:  Discussed DM Plate Method to improve blood glucose.   Handout provided Encouraged sugar free beverages Encouraged low sugar protein shakes, if decided to drink Discussed soft foods with mucositis     MONITORING, EVALUATION, GOAL: weight trends, intake   NEXT VISIT: Tuesday, June 17 during infusion  [name], CSO, LDN Registered Dietitian [phone]

## 2024-03-31 ENCOUNTER — Inpatient Hospital Stay

## 2024-03-31 VITALS — BP 133/66 | HR 63 | Temp 98.2°F | Resp 16

## 2024-03-31 DIAGNOSIS — F17210 Nicotine dependence, cigarettes, uncomplicated: Secondary | ICD-10-CM | POA: Diagnosis not present

## 2024-03-31 DIAGNOSIS — Z923 Personal history of irradiation: Secondary | ICD-10-CM | POA: Diagnosis not present

## 2024-03-31 DIAGNOSIS — C7A1 Malignant poorly differentiated neuroendocrine tumors: Secondary | ICD-10-CM

## 2024-03-31 DIAGNOSIS — Z5189 Encounter for other specified aftercare: Secondary | ICD-10-CM | POA: Diagnosis not present

## 2024-03-31 DIAGNOSIS — Z7982 Long term (current) use of aspirin: Secondary | ICD-10-CM | POA: Diagnosis not present

## 2024-03-31 DIAGNOSIS — G4700 Insomnia, unspecified: Secondary | ICD-10-CM | POA: Diagnosis not present

## 2024-03-31 DIAGNOSIS — E1165 Type 2 diabetes mellitus with hyperglycemia: Secondary | ICD-10-CM | POA: Diagnosis not present

## 2024-03-31 DIAGNOSIS — Z5111 Encounter for antineoplastic chemotherapy: Secondary | ICD-10-CM | POA: Diagnosis not present

## 2024-03-31 MED ORDER — ETOPOSIDE CHEMO INJECTION 1 GM/50ML
100.0000 mg/m2 | Freq: Once | INTRAVENOUS | Status: AC
  Administered 2024-03-31: 200 mg via INTRAVENOUS
  Filled 2024-03-31: qty 10

## 2024-03-31 MED ORDER — SODIUM CHLORIDE 0.9 % IV SOLN
INTRAVENOUS | Status: DC
Start: 2024-03-31 — End: 2024-03-31
  Filled 2024-03-31: qty 250

## 2024-03-31 MED ORDER — DEXAMETHASONE SODIUM PHOSPHATE 10 MG/ML IJ SOLN
10.0000 mg | Freq: Once | INTRAMUSCULAR | Status: AC
Start: 2024-03-31 — End: 2024-03-31
  Administered 2024-03-31: 10 mg via INTRAVENOUS
  Filled 2024-03-31: qty 1

## 2024-03-31 NOTE — Patient Instructions (Signed)
 [facility]  Discharge Instructions: Thank you for choosing [facility] to provide your oncology and hematology care.  If you have a lab appointment with the Cancer Center, please go directly to the Cancer Center and check in at the registration area.  Wear comfortable clothing and clothing appropriate for easy access to any Portacath or PICC line.   We strive to give you quality time with your provider. You may need to reschedule your appointment if you arrive late (15 or more minutes).  Arriving late affects you and other patients whose appointments are after yours.  Also, if you miss three or more appointments without notifying the office, you may be dismissed from the clinic at the provider's discretion.      For prescription refill requests, have your pharmacy contact our office and allow 72 hours for refills to be completed.    Today you received the following chemotherapy and/or immunotherapy agents- etoposide      To help prevent nausea and vomiting after your treatment, we encourage you to take your nausea medication as directed.  BELOW ARE SYMPTOMS THAT SHOULD BE REPORTED IMMEDIATELY: *FEVER GREATER THAN 100.4 F (38 C) OR HIGHER *CHILLS OR SWEATING *NAUSEA AND VOMITING THAT IS NOT CONTROLLED WITH YOUR NAUSEA MEDICATION *UNUSUAL SHORTNESS OF BREATH *UNUSUAL BRUISING OR BLEEDING *URINARY PROBLEMS (pain or burning when urinating, or frequent urination) *BOWEL PROBLEMS (unusual diarrhea, constipation, pain near the anus) TENDERNESS IN MOUTH AND THROAT WITH OR WITHOUT PRESENCE OF ULCERS (sore throat, sores in mouth, or a toothache) UNUSUAL RASH, SWELLING OR PAIN  UNUSUAL VAGINAL DISCHARGE OR ITCHING   Items with * indicate a potential emergency and should be followed up as soon as possible or go to the Emergency Department if any problems should occur.  Please show the CHEMOTHERAPY ALERT CARD or IMMUNOTHERAPY  ALERT CARD at check-in to the Emergency Department and triage nurse.  Should you have questions after your visit or need to cancel or reschedule your appointment, please contact [facility] - A DEPT OF [facility]  [phone] and follow the prompts.  Office hours are 8:00 a.m. to 4:30 p.m. Monday - Friday. Please note that voicemails left after 4:00 p.m. may not be returned until the following business day.  We are closed weekends and major holidays. You have access to a nurse at all times for urgent questions. Please call the main number to the clinic [phone] and follow the prompts.  For any non-urgent questions, you may also contact your provider using MyChart. We now offer e-Visits for anyone 29 and older to request care online for non-urgent symptoms. For details visit [email or web address].   Also download the MyChart app! Go to the app store, search "MyChart", open the app, select [place], and log in with your MyChart username and password.

## 2024-03-31 NOTE — Research (Signed)
 Trial:  Effectiveness of Out-of-Pocket Psychologist, forensic ([employer]) in Cancer Patients  Patient [name] was identified by the research nurse as a potential candidate for the above listed study.  This Clinical Research Nurse met with [name], [id number], on 03/31/24 in a manner and location that ensures patient privacy to discuss participation in the above listed research study.  Patient is Accompanied by his son, [name] .  A copy of the informed consent document and separate HIPAA Authorization was provided to the patient.  Patient reads, speaks, and understands [place].   Patient was provided with the business card of this Nurse and encouraged to contact the research team with any questions.  Approximately 20 minutes were spent with the patient reviewing the informed consent documents.  Patient was provided the option of taking informed consent documents home to review and was encouraged to review at their convenience with their support network, including other care providers. Patient took the consent documents home to review. Patient was sleeping at the time of the research visit. Patients son, [name] was interested in hearing about the protocol. ICF reviewed briefly with son. He states he wants to take it home for his Dad to read over. He says he feels it might be a good thing for him to do. Research business card and Clinical Trials brochure provided to the son for patient. Research nurse will meet with patient again on Friday when he returns for his last infusion this week to follow up for interest.  Luise Saint, RN 03/31/24 2:41 PM

## 2024-04-01 ENCOUNTER — Other Ambulatory Visit: Payer: Self-pay

## 2024-04-01 ENCOUNTER — Inpatient Hospital Stay

## 2024-04-01 VITALS — BP 117/66 | HR 62 | Temp 97.5°F | Resp 16

## 2024-04-01 DIAGNOSIS — G4700 Insomnia, unspecified: Secondary | ICD-10-CM | POA: Diagnosis not present

## 2024-04-01 DIAGNOSIS — Z923 Personal history of irradiation: Secondary | ICD-10-CM | POA: Diagnosis not present

## 2024-04-01 DIAGNOSIS — E1165 Type 2 diabetes mellitus with hyperglycemia: Secondary | ICD-10-CM | POA: Diagnosis not present

## 2024-04-01 DIAGNOSIS — Z7982 Long term (current) use of aspirin: Secondary | ICD-10-CM | POA: Diagnosis not present

## 2024-04-01 DIAGNOSIS — F17210 Nicotine dependence, cigarettes, uncomplicated: Secondary | ICD-10-CM | POA: Diagnosis not present

## 2024-04-01 DIAGNOSIS — C7A1 Malignant poorly differentiated neuroendocrine tumors: Secondary | ICD-10-CM | POA: Diagnosis not present

## 2024-04-01 DIAGNOSIS — Z5111 Encounter for antineoplastic chemotherapy: Secondary | ICD-10-CM | POA: Diagnosis not present

## 2024-04-01 DIAGNOSIS — Z5189 Encounter for other specified aftercare: Secondary | ICD-10-CM | POA: Diagnosis not present

## 2024-04-01 MED ORDER — DEXAMETHASONE SODIUM PHOSPHATE 10 MG/ML IJ SOLN
10.0000 mg | Freq: Once | INTRAMUSCULAR | Status: AC
  Administered 2024-04-01: 10 mg via INTRAVENOUS
  Filled 2024-04-01: qty 1

## 2024-04-01 MED ORDER — SODIUM CHLORIDE 0.9 % IV SOLN
100.0000 mg/m2 | Freq: Once | INTRAVENOUS | Status: AC
  Administered 2024-04-01: 200 mg via INTRAVENOUS
  Filled 2024-04-01: qty 10

## 2024-04-01 MED ORDER — SODIUM CHLORIDE 0.9 % IV SOLN
INTRAVENOUS | Status: DC
  Filled 2024-04-01: qty 250

## 2024-04-01 NOTE — Patient Instructions (Signed)
 [facility]  Discharge Instructions: Thank you for choosing [facility] to provide your oncology and hematology care.  If you have a lab appointment with the Cancer Center, please go directly to the Cancer Center and check in at the registration area.  Wear comfortable clothing and clothing appropriate for easy access to any Portacath or PICC line.   We strive to give you quality time with your provider. You may need to reschedule your appointment if you arrive late (15 or more minutes).  Arriving late affects you and other patients whose appointments are after yours.  Also, if you miss three or more appointments without notifying the office, you may be dismissed from the clinic at the provider's discretion.      For prescription refill requests, have your pharmacy contact our office and allow 72 hours for refills to be completed.    Today you received the following chemotherapy and/or immunotherapy agents- etoposide      To help prevent nausea and vomiting after your treatment, we encourage you to take your nausea medication as directed.  BELOW ARE SYMPTOMS THAT SHOULD BE REPORTED IMMEDIATELY: *FEVER GREATER THAN 100.4 F (38 C) OR HIGHER *CHILLS OR SWEATING *NAUSEA AND VOMITING THAT IS NOT CONTROLLED WITH YOUR NAUSEA MEDICATION *UNUSUAL SHORTNESS OF BREATH *UNUSUAL BRUISING OR BLEEDING *URINARY PROBLEMS (pain or burning when urinating, or frequent urination) *BOWEL PROBLEMS (unusual diarrhea, constipation, pain near the anus) TENDERNESS IN MOUTH AND THROAT WITH OR WITHOUT PRESENCE OF ULCERS (sore throat, sores in mouth, or a toothache) UNUSUAL RASH, SWELLING OR PAIN  UNUSUAL VAGINAL DISCHARGE OR ITCHING   Items with * indicate a potential emergency and should be followed up as soon as possible or go to the Emergency Department if any problems should occur.  Please show the CHEMOTHERAPY ALERT CARD or IMMUNOTHERAPY  ALERT CARD at check-in to the Emergency Department and triage nurse.  Should you have questions after your visit or need to cancel or reschedule your appointment, please contact [facility] - A DEPT OF [facility]  [phone] and follow the prompts.  Office hours are 8:00 a.m. to 4:30 p.m. Monday - Friday. Please note that voicemails left after 4:00 p.m. may not be returned until the following business day.  We are closed weekends and major holidays. You have access to a nurse at all times for urgent questions. Please call the main number to the clinic [phone] and follow the prompts.  For any non-urgent questions, you may also contact your provider using MyChart. We now offer e-Visits for anyone 29 and older to request care online for non-urgent symptoms. For details visit [email or web address].   Also download the MyChart app! Go to the app store, search "MyChart", open the app, select [place], and log in with your MyChart username and password.

## 2024-04-02 ENCOUNTER — Inpatient Hospital Stay

## 2024-04-02 DIAGNOSIS — C7A1 Malignant poorly differentiated neuroendocrine tumors: Secondary | ICD-10-CM

## 2024-04-02 DIAGNOSIS — G4700 Insomnia, unspecified: Secondary | ICD-10-CM | POA: Diagnosis not present

## 2024-04-02 DIAGNOSIS — Z5189 Encounter for other specified aftercare: Secondary | ICD-10-CM | POA: Diagnosis not present

## 2024-04-02 DIAGNOSIS — Z7982 Long term (current) use of aspirin: Secondary | ICD-10-CM | POA: Diagnosis not present

## 2024-04-02 DIAGNOSIS — Z5111 Encounter for antineoplastic chemotherapy: Secondary | ICD-10-CM | POA: Diagnosis not present

## 2024-04-02 DIAGNOSIS — E1165 Type 2 diabetes mellitus with hyperglycemia: Secondary | ICD-10-CM | POA: Diagnosis not present

## 2024-04-02 DIAGNOSIS — F17210 Nicotine dependence, cigarettes, uncomplicated: Secondary | ICD-10-CM | POA: Diagnosis not present

## 2024-04-02 DIAGNOSIS — Z923 Personal history of irradiation: Secondary | ICD-10-CM | POA: Diagnosis not present

## 2024-04-02 MED ORDER — PEGFILGRASTIM-JMDB 6 MG/0.6ML ~~LOC~~ SOSY
6.0000 mg | PREFILLED_SYRINGE | Freq: Once | SUBCUTANEOUS | Status: AC
  Administered 2024-04-02: 6 mg via SUBCUTANEOUS
  Filled 2024-04-02: qty 0.6

## 2024-04-06 ENCOUNTER — Telehealth: Payer: Self-pay

## 2024-04-06 NOTE — Telephone Encounter (Signed)
 [id number]- Effectiveness of Out-of Pocket Cost Communication and Financial Navigation (CostCOM) in Cancer Patients:    Research nurse spoke with the patient this morning to see if he is interested in participating in the protocol after consent review at home. He states he really hasn't had a chance to read through it all yet and wants to discuss it with his children. Research nurse reviewed the protocol briefly and encouraged patient to call research and inform of his decision for participation after he talks it over with his family. Patient states he would do that. Patient was thanked for his time and consideration of protocol participation. Luise Saint, RN 04/06/24 12:05 PM

## 2024-04-07 ENCOUNTER — Ambulatory Visit: Admitting: Gastroenterology

## 2024-04-07 VITALS — BP 147/79 | HR 79 | Temp 97.9°F | Wt 179.0 lb

## 2024-04-07 DIAGNOSIS — K7581 Nonalcoholic steatohepatitis (NASH): Secondary | ICD-10-CM

## 2024-04-07 DIAGNOSIS — K7460 Unspecified cirrhosis of liver: Secondary | ICD-10-CM | POA: Diagnosis not present

## 2024-04-07 NOTE — Progress Notes (Signed)
 [name], MD [address]  [address]  [place], [place] [address]  Main: [phone]  Fax: [phone]    Gastroenterology Consultation  Referring Provider:     Aloha Arnold, PA-C Primary Care Physician:  Aloha Arnold, PA-C Primary Gastroenterologist:  Dr. [name] Reason for Consultation: Cirrhosis of liver        HPI:   [name] is a 68 y.o. male referred by Aloha Arnold, PA-C  for consultation & management of cirrhosis of liver.  Patient underwent right upper quadrant ultrasound on 01/31/2023, found to have cirrhosis of liver.  This was performed for elevated LFTs.  Therefore, referred to GI to establish care.  He had elevated alkaline phosphatase in 2019, mildly elevated AST and ALT in 2012.  History of COPD, diabetes diet controlled, coronary artery disease, underwent CABG several years ago Patient smokes tobacco, accompanied by his daughter and son-in-law.  He denies any swelling of legs, abdominal distention, rectal bleeding, melena, nausea or vomiting, pruritus, jaundice. He reports having blood transfusion when he had CABG.  Denies any IV drug abuse, no known history of hepatitis B or C  Follow-up visit 09/15/2023 [name] is here for follow-up of MASH cirrhosis.  He underwent secondary liver disease workup came back unremarkable.  He has compensated cirrhosis of liver.  EGD did not reveal any varices, H. pylori was positive, treated with triple therapy.  Confirmed eradication by H. pylori breath test.  Patient does not have any concerns today other than symptoms of fibromyalgia.  He continues to smoke tobacco.  Follow-up visit 04/07/2024 Mr. [name] is here for follow-up of [name] cirrhosis.  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  His most recent LFTs showed mildly elevated alkaline phosphatase and ALT.  Most recent ultrasound liver showed  cirrhosis only without any evidence of liver lesions.  AFP was normal.  He denies any swelling of legs, abdominal distention, black stools, rectal bleeding, pruritus.  He continues to smoke tobacco  NSAIDs: None  Antiplts/Anticoagulants/Anti thrombotics: None  GI Procedures:   EGD and colonoscopy 03/26/2023 - Normal duodenal bulb and second portion of the duodenum. - Erythematous mucosa in the stomach. Biopsied. - Esophagogastric landmarks identified. - Normal gastroesophageal junction and esophagus.  - One 4 mm polyp in the proximal rectum, removed with a cold snare. Resected and retrieved. - The distal rectum and anal verge are normal on retroflexion view.  DIAGNOSIS:  A. STOMACH; COLD BIOPSY:  - MODERATE CHRONIC ACTIVE HELICOBACTER-ASSOCIATED GASTRITIS.  - INCIDENTAL BENIGN TACTILE CORPUSCLE-LIKE BODIES.  - NEGATIVE FOR INTESTINAL METAPLASIA, DYSPLASIA, AND MALIGNANCY.   B.  COLON POLYP, DESCENDING; COLD SNARE:  - TUBULAR ADENOMA.  - NEGATIVE FOR HIGH-GRADE DYSPLASIA AND MALIGNANCY.   Past Medical History:  Diagnosis Date   Anemia    Anginal pain (HCC)    Arthritis    Atherosclerotic peripheral vascular disease with intermittent claudication (HCC)    Bronchitis    h/o   CHF (congestive heart failure) (HCC)    Cirrhosis, non-alcoholic (HCC)    Coronary artery disease    Depression    Diabetes mellitus without complication (HCC)    diet controlled   Difficult intubation    Pt Denies   Dyspnea    with exertion   GERD (gastroesophageal reflux disease)    Heart failure with mid-range ejection fraction (HFmEF) (HCC)    History of MRSA infection 2010  Hx of CABG    Hyperlipidemia    Hypertension    Insomnia    Ischemic cardiomyopathy    Liver cirrhosis (HCC)    Peripheral vascular disease (HCC)    Stomach ulcer    Stomach ulcer    Wears hearing aid in both ears     Past Surgical History:  Procedure Laterality Date   ANTERIOR CERVICAL DECOMP/DISCECTOMY FUSION N/A  01/28/2018   Procedure: ANTERIOR CERVICAL DECOMPRESSION/DISCECTOMY FUSION 2 LEVELS C3-5;  Surgeon: Jodeen Munch, MD;  Location: [facility];  Service: Neurosurgery;  Laterality: N/A;   BRONCHIAL BIOPSY  12/25/2023   Procedure: BRONCHIAL BIOPSIES;  Surgeon: Vergia Glasgow, MD;  Location: [facility];  Service: Pulmonary;;   BRONCHIAL BRUSHINGS  12/25/2023   Procedure: BRONCHIAL BRUSHINGS;  Surgeon: Vergia Glasgow, MD;  Location: [facility];  Service: Pulmonary;;   BRONCHIAL NEEDLE ASPIRATION BIOPSY  12/25/2023   Procedure: BRONCHIAL NEEDLE ASPIRATION BIOPSIES;  Surgeon: Vergia Glasgow, MD;  Location: [facility];  Service: Pulmonary;;   BRONCHIAL WASHINGS  12/25/2023   Procedure: BRONCHIAL WASHINGS;  Surgeon: Vergia Glasgow, MD;  Location: [facility];  Service: Pulmonary;;   COLON SURGERY  2004   due to diverticulitis.  Partail colectomy with Anastamosis   COLONOSCOPY N/A 03/27/2023   Procedure: COLONOSCOPY;  Surgeon: Selena Daily, MD;  Location: [facility] END[facility];  Service: Gastroenterology;  Laterality: N/A;   COLONOSCOPY WITH PROPOFOL  N/A 03/26/2023   Procedure: COLONOSCOPY WITH PROPOFOL ;  Surgeon: Selena Daily, MD;  Location: [facility] END[facility]COPY;  Service: Gastroenterology;  Laterality: N/A;   CORONARY ARTERY BYPASS GRAFT     ESOPHAGOGASTRODUODENOSCOPY (EGD) WITH PROPOFOL  N/A 03/26/2023   Procedure: ESOPHAGOGASTRODUODENOSCOPY (EGD) WITH PROPOFOL ;  Surgeon: Selena Daily, MD;  Location: [facility];  Service: Gastroenterology;  Laterality: N/A;   FINE NEEDLE ASPIRATION  12/25/2023   Procedure: FINE NEEDLE ASPIRATION (FNA) LINEAR;  Surgeon: Vergia Glasgow, MD;  Location: [facility];  Service: Pulmonary;;   HAND TENDON SURGERY Right    right thumb   LUMBAR LAMINECTOMY/DECOMPRESSION MICRODISCECTOMY N/A 09/01/2019   Procedure: LUMBAR LAMINECTOMY/DECOMPRESSION MICRODISCECTOMY 3 LEVELS L2-L5;  Surgeon: Jodeen Munch, MD;  Location: [facility];  Service: Neurosurgery;   Laterality: N/A;   neck fusion     SHOULDER ARTHROSCOPY WITH OPEN ROTATOR CUFF REPAIR Right 04/08/2016   Procedure: SHOULDER ARTHROSCOPY WITH OPEN ROTATOR CUFF REPAIR;  Surgeon: Marlynn Singer, MD;  Location: [facility];  Service: Orthopedics;  Laterality: Right;   TONSILLECTOMY     TRANSFORAMINAL LUMBAR INTERBODY FUSION W/ MIS 1 LEVEL N/A 09/01/2019   Procedure: MINIMALLY INVASIVE (MIS) TRANSFORAMINAL LUMBAR INTERBODY FUSION (TLIF) 1 LEVEL L5-S1;  Surgeon: Jodeen Munch, MD;  Location: [facility];  Service: Neurosurgery;  Laterality: N/A;   TYMPANOPLASTY Right 08/19/2023   Procedure: TYMPANOPLASTY WITH REMOVAL OF CHOLESTEATOMA;  Surgeon: Von Grumbling, MD;  Location: [facility];  Service: ENT;  Laterality: Right;   VIDEO BRONCHOSCOPY WITH ENDOBRONCHIAL ULTRASOUND  12/25/2023   Procedure: VIDEO BRONCHOSCOPY WITH ENDOBRONCHIAL ULTRASOUND;  Surgeon: Vergia Glasgow, MD;  Location: [facility];  Service: Pulmonary;;   VIDEO BRONCHOSCOPY WITH RADIAL ENDOBRONCHIAL ULTRASOUND  12/25/2023   Procedure: VIDEO BRONCHOSCOPY WITH RADIAL ENDOBRONCHIAL ULTRASOUND;  Surgeon: Vergia Glasgow, MD;  Location: [facility];  Service: Pulmonary;;     Current Outpatient Medications:    albuterol  (VENTOLIN  HFA) 108 (90 Base) MCG/ACT inhaler, Inhale 1-2 puffs into the lungs every 6 (six) hours as needed for wheezing or shortness of breath., Disp: , Rfl:    amitriptyline  (ELAVIL ) 50 MG tablet, Take 50 mg by  mouth at bedtime., Disp: , Rfl:    amLODipine (NORVASC) 2.5 MG tablet, Take 1 tablet by mouth at bedtime., Disp: , Rfl:    aspirin 325 MG tablet, Take 325 mg by mouth daily., Disp: , Rfl:    ciprofloxacin -dexamethasone  (CIPRODEX ) OTIC suspension, Place 4 drops into both ears 2 (two) times daily for 5 days. DOS 08/19/23., Disp: 7.5 mL, Rfl: 0   citalopram  (CELEXA ) 40 MG tablet, Take 40 mg by mouth every morning. , Disp: , Rfl:    dexamethasone  (DECADRON ) 4 MG tablet, Take 2 tablets daily for 2 days, on days  4 and 5.Take with food. Every 21 days., Disp: 30 tablet, Rfl: 1   DULoxetine (CYMBALTA) 60 MG capsule, Take 60 mg by mouth 2 (two) times daily., Disp: , Rfl:    fluticasone (FLONASE) 50 MCG/ACT nasal spray, Place 2 sprays into both nostrils daily., Disp: , Rfl:    isosorbide mononitrate (IMDUR) 30 MG 24 hr tablet, Take 30 mg by mouth daily., Disp: , Rfl:    lansoprazole (PREVACID) 15 MG capsule, Take 15 mg by mouth daily at 12 noon., Disp: , Rfl:    loratadine (CLARITIN) 10 MG tablet, Take 10 mg by mouth daily., Disp: , Rfl:    magic mouthwash (nystatin , lidocaine , diphenhydrAMINE, alum & mag hydroxide) suspension, Take 5 mLs by mouth 3 (three) times daily as needed for mouth pain., Disp: 300 mL, Rfl: 1   meloxicam  (MOBIC ) 15 MG tablet, Take 15 mg by mouth every morning., Disp: , Rfl:    nitroGLYCERIN  (NITROSTAT ) 0.4 MG SL tablet, Place 0.4 mg under the tongue every 5 (five) minutes as needed for chest pain., Disp: , Rfl:    ofloxacin  (OCUFLOX ) 0.3 % ophthalmic solution, Place 4 drops into the left ear 2 (two) times daily., Disp: 5 mL, Rfl: 5   omeprazole  (PRILOSEC ) 20 MG capsule, Take 20 mg by mouth daily., Disp: , Rfl:    ondansetron  (ZOFRAN ) 8 MG tablet, Take 1 tablet (8 mg total) by mouth every 8 (eight) hours as needed for nausea or vomiting. Start on third day after chemotherapy., Disp: 30 tablet, Rfl: 1   pregabalin (LYRICA) 300 MG capsule, Take 300 mg by mouth 2 (two) times daily., Disp: , Rfl:    prochlorperazine  (COMPAZINE ) 10 MG tablet, Take 1 tablet (10 mg total) by mouth every 6 (six) hours as needed for nausea or vomiting (Nausea or vomiting)., Disp: 30 tablet, Rfl: 1   rosuvastatin (CRESTOR) 20 MG tablet, Take 20 mg by mouth daily., Disp: , Rfl:    spironolactone (ALDACTONE) 25 MG tablet, Take 12.5 mg by mouth daily., Disp: , Rfl:    tamsulosin (FLOMAX) 0.4 MG CAPS capsule, Take 0.4 mg by mouth daily after supper., Disp: , Rfl:    umeclidinium-vilanterol (ANORO ELLIPTA ) 62.5-25  MCG/ACT AEPB, Inhale 1 puff into the lungs daily., Disp: 30 each, Rfl: 11   valsartan (DIOVAN) 320 MG tablet, Take 320 mg by mouth daily., Disp: , Rfl:    No family history on file.   Social History   Tobacco Use   Smoking status: Every Day    Current packs/day: 1.00    Average packs/day: 1 pack/day for 50.4 years (50.4 ttl pk-yrs)    Types: Cigarettes    Start date: 02/08/1968    Last attempt to quit: 02/07/2018   Smokeless tobacco: Never   Tobacco comments:    1PPD khj 01/07/2024  Vaping Use   Vaping status: Former  Substance Use Topics   Alcohol use:  Not Currently    Comment: rarely   Drug use: No    Allergies as of 04/07/2024 - Review Complete 04/07/2024  Allergen Reaction Noted   Penicillins Hives 07/23/2007   Atorvastatin Other (See Comments) 01/16/2016    Review of Systems:    All systems reviewed and negative except where noted in HPI.   Physical Exam:  BP (!) 147/79 (BP Location: Left Arm, Patient Position: Sitting, Cuff Size: Normal)   Pulse 79   Temp 97.9 F (36.6 C) (Oral)   Wt 179 lb (81.2 kg)   BMI 24.97 kg/m  No LMP for male patient.  General:   Alert,  Well-developed, well-nourished, pleasant and cooperative in NAD Head:  Normocephalic and atraumatic. Eyes:  Sclera clear, no icterus.   Conjunctiva pink. Ears:  Normal auditory acuity. Nose:  No deformity, discharge, or lesions. Mouth:  No deformity or lesions,oropharynx pink & moist. Neck:  Supple; no masses or thyromegaly. Lungs:  Respirations even and unlabored.  Clear throughout to auscultation.   No wheezes, crackles, or rhonchi. No acute distress. Heart:  Regular rate and rhythm; no murmurs, clicks, rubs, or gallops. Abdomen:  Normal bowel sounds. Soft, non-tender and non-distended without masses, hepatosplenomegaly or hernias noted.  No guarding or rebound tenderness.   Rectal: Not performed Msk:  Symmetrical without gross deformities. Good, equal movement & strength bilaterally. Pulses:   Normal pulses noted. Extremities:  No clubbing or edema.  No cyanosis. Neurologic:  Alert and oriented x3;  grossly normal neurologically. Skin:  Intact without significant lesions or rashes. No jaundice. Psych:  Alert and cooperative. Normal mood and affect.  Imaging Studies: Reviewed  Assessment and Plan:   JERMICHAEL MAYVILLE is a 68 y.o. 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  MASH Cirrhosis, well compensated secondary liver disease workup was unremarkable Recommend hepatitis A and B vaccine, patient deferred today No evidence of varices on EGD Reiterated about low-sodium diet Right upper quadrant ultrasound for HCC surveillance and serum AFP levels in 6 months Uptrending alkaline phosphatase, secondary to chemotherapy, do not recommend any further workup at this time   Follow up in 6 months   [name], MD

## 2024-04-08 DIAGNOSIS — E1169 Type 2 diabetes mellitus with other specified complication: Secondary | ICD-10-CM | POA: Diagnosis not present

## 2024-04-08 DIAGNOSIS — C7A1 Malignant poorly differentiated neuroendocrine tumors: Secondary | ICD-10-CM | POA: Diagnosis not present

## 2024-04-08 DIAGNOSIS — K7460 Unspecified cirrhosis of liver: Secondary | ICD-10-CM | POA: Diagnosis not present

## 2024-04-16 MED FILL — Fosaprepitant Dimeglumine For IV Infusion 150 MG (Base Eq): INTRAVENOUS | Qty: 5 | Status: AC

## 2024-04-20 ENCOUNTER — Inpatient Hospital Stay

## 2024-04-20 ENCOUNTER — Other Ambulatory Visit

## 2024-04-20 ENCOUNTER — Encounter: Payer: Self-pay | Admitting: Oncology

## 2024-04-20 ENCOUNTER — Inpatient Hospital Stay (HOSPITAL_BASED_OUTPATIENT_CLINIC_OR_DEPARTMENT_OTHER): Admitting: Oncology

## 2024-04-20 ENCOUNTER — Ambulatory Visit

## 2024-04-20 ENCOUNTER — Ambulatory Visit: Admitting: Oncology

## 2024-04-20 VITALS — BP 124/78 | HR 82 | Temp 98.9°F | Resp 15 | Wt 184.0 lb

## 2024-04-20 DIAGNOSIS — C7A1 Malignant poorly differentiated neuroendocrine tumors: Secondary | ICD-10-CM | POA: Diagnosis not present

## 2024-04-20 DIAGNOSIS — Z5111 Encounter for antineoplastic chemotherapy: Secondary | ICD-10-CM | POA: Diagnosis not present

## 2024-04-20 DIAGNOSIS — Z5189 Encounter for other specified aftercare: Secondary | ICD-10-CM | POA: Diagnosis not present

## 2024-04-20 DIAGNOSIS — E1165 Type 2 diabetes mellitus with hyperglycemia: Secondary | ICD-10-CM

## 2024-04-20 DIAGNOSIS — F17210 Nicotine dependence, cigarettes, uncomplicated: Secondary | ICD-10-CM | POA: Diagnosis not present

## 2024-04-20 DIAGNOSIS — Z7982 Long term (current) use of aspirin: Secondary | ICD-10-CM | POA: Diagnosis not present

## 2024-04-20 DIAGNOSIS — Z923 Personal history of irradiation: Secondary | ICD-10-CM | POA: Diagnosis not present

## 2024-04-20 DIAGNOSIS — G4700 Insomnia, unspecified: Secondary | ICD-10-CM | POA: Diagnosis not present

## 2024-04-20 LAB — CBC WITH DIFFERENTIAL (CANCER CENTER ONLY)
Abs Immature Granulocytes: 0.52 10*3/uL — ABNORMAL HIGH (ref 0.00–0.07)
Basophils Absolute: 0.1 10*3/uL (ref 0.0–0.1)
Basophils Relative: 1 %
Eosinophils Absolute: 0.2 10*3/uL (ref 0.0–0.5)
Eosinophils Relative: 1 %
HCT: 34.2 % — ABNORMAL LOW (ref 39.0–52.0)
Hemoglobin: 11.5 g/dL — ABNORMAL LOW (ref 13.0–17.0)
Immature Granulocytes: 5 %
Lymphocytes Relative: 12 %
Lymphs Abs: 1.4 10*3/uL (ref 0.7–4.0)
MCH: 32.4 pg (ref 26.0–34.0)
MCHC: 33.6 g/dL (ref 30.0–36.0)
MCV: 96.3 fL (ref 80.0–100.0)
Monocytes Absolute: 0.5 10*3/uL (ref 0.1–1.0)
Monocytes Relative: 5 %
Neutro Abs: 8.9 10*3/uL — ABNORMAL HIGH (ref 1.7–7.7)
Neutrophils Relative %: 76 %
Platelet Count: 145 10*3/uL — ABNORMAL LOW (ref 150–400)
RBC: 3.55 MIL/uL — ABNORMAL LOW (ref 4.22–5.81)
RDW: 14.6 % (ref 11.5–15.5)
WBC Count: 11.5 10*3/uL — ABNORMAL HIGH (ref 4.0–10.5)
nRBC: 0 % (ref 0.0–0.2)

## 2024-04-20 LAB — CMP (CANCER CENTER ONLY)
ALT: 49 U/L — ABNORMAL HIGH (ref 0–44)
AST: 38 U/L (ref 15–41)
Albumin: 3.6 g/dL (ref 3.5–5.0)
Alkaline Phosphatase: 135 U/L — ABNORMAL HIGH (ref 38–126)
Anion gap: 10 (ref 5–15)
BUN: 18 mg/dL (ref 8–23)
CO2: 23 mmol/L (ref 22–32)
Calcium: 8.2 mg/dL — ABNORMAL LOW (ref 8.9–10.3)
Chloride: 102 mmol/L (ref 98–111)
Creatinine: 0.95 mg/dL (ref 0.61–1.24)
GFR, Estimated: >60 mL/min (ref >60)
Glucose, Bld: 348 mg/dL — ABNORMAL HIGH (ref 70–99)
Potassium: 3.9 mmol/L (ref 3.5–5.1)
Sodium: 135 mmol/L (ref 135–145)
Total Bilirubin: 0.6 mg/dL (ref 0.0–1.2)
Total Protein: 6.6 g/dL (ref 6.5–8.1)

## 2024-04-20 MED ORDER — SODIUM CHLORIDE 0.9 % IV SOLN: 2.0000 g | Freq: Once | INTRAVENOUS | Status: DC

## 2024-04-20 MED ORDER — OYSTER SHELL CALCIUM/D3 500-5 MG-MCG PO TABS: 2.0000 | ORAL_TABLET | Freq: Every day | ORAL | 2 refills | Status: AC

## 2024-04-20 MED ORDER — SODIUM CHLORIDE 0.9 % IV SOLN
150.0000 mg | Freq: Once | INTRAVENOUS | Status: AC
  Administered 2024-04-20: 150 mg via INTRAVENOUS
  Filled 2024-04-20: qty 150

## 2024-04-20 MED ORDER — SODIUM CHLORIDE 0.9 % IV SOLN
505.0000 mg | Freq: Once | INTRAVENOUS | Status: AC
  Administered 2024-04-20: 510 mg via INTRAVENOUS
  Filled 2024-04-20: qty 51

## 2024-04-20 MED ORDER — PALONOSETRON HCL INJECTION 0.25 MG/5ML
0.2500 mg | Freq: Once | INTRAVENOUS | Status: AC
  Administered 2024-04-20: 0.25 mg via INTRAVENOUS
  Filled 2024-04-20: qty 5

## 2024-04-20 MED ORDER — SODIUM CHLORIDE 0.9 % IV SOLN
100.0000 mg/m2 | Freq: Once | INTRAVENOUS | Status: AC
  Administered 2024-04-20: 200 mg via INTRAVENOUS
  Filled 2024-04-20: qty 10

## 2024-04-20 MED ORDER — SODIUM CHLORIDE 0.9 % IV SOLN
INTRAVENOUS | Status: DC
  Filled 2024-04-20: qty 250

## 2024-04-20 MED ORDER — CALCIUM GLUCONATE-NACL 2-0.675 GM/100ML-% IV SOLN
2.0000 g | Freq: Once | INTRAVENOUS | Status: AC
  Administered 2024-04-20: 2000 mg via INTRAVENOUS
  Filled 2024-04-20: qty 100

## 2024-04-20 MED ORDER — DEXAMETHASONE SODIUM PHOSPHATE 10 MG/ML IJ SOLN
10.0000 mg | Freq: Once | INTRAMUSCULAR | Status: AC
  Administered 2024-04-20: 10 mg via INTRAVENOUS
  Filled 2024-04-20: qty 1

## 2024-04-20 NOTE — Assessment & Plan Note (Signed)
 Chemotherapy plan as listed above

## 2024-04-20 NOTE — Progress Notes (Signed)
 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 Not a surgical candidate.  S/p SBRT.  Recommend adjuvant carboplatin  and Etoposide  for 4 cycles. .  Labs are reviewed and discussed with patient. He tolerates treatment well.  Proceed with Cytcle 3 D1carboplatin and D1-3 Etoposide  with GCSF on D5. Recommend claritin 10mg  daily x 4 days.    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  He is on Aspirin 325mg  daily and Statin.    Encounter for antineoplastic chemotherapy Chemotherapy plan as listed above  Hyperglycemia due to diabetes mellitus (HCC) Previously have diet controlled DM.  Glucose level in 300s. Partially due to steroid use.  Recommend patient to start diabetic diet. Refer to nutritionist for education.  He has started on DM treatment - metformin  Hypocalcemia IV Calcium gluconate 2g x 1.  Recommend calcium 1000-1200mg  daily with Vitamin D  supplementation.   Insomnia Recommend otc melatonin 5mg  at bedtime PRN.  Recommend patient to decrease caffeine intake   No orders of the defined types were placed in this encounter.  Follow-up 3 weeks  All questions were answered. The patient knows to call the clinic with any problems, questions or concerns.  Shane Forbes, MD, PhD [facility] Hematology Oncology 04/20/2024    HISTORY OF PRESENTING ILLNESS:  [name] 68 y.o. 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 I have reviewed his chart and materials related to his cancer extensively and collaborated history with the patient. Summary of oncologic history is as follows: Oncology  History  High grade neuroendocrine carcinoma of lung (HCC)  10/28/2023 Imaging   PET scan showed 1. Intense metabolic activity associated with a LEFT upper lobe pulmonary nodule consistent primary bronchogenic carcinoma. 2. No evidence of metastatic adenopathy in the mediastinum. No distant metastatic disease. 3. Intense metabolic activity associated with the RIGHT shoulder versus arthroplasty. Recommend correlation for loosening or infection. 4. Cirrhotic morphology of the liver. 5. Post CABG.   11/25/2023 Imaging   CT lung nodule   1. Enlarging microlobulated lingular nodule, now 15.9 mm in size. Lung-RADS 4B, suspicious. Additional imaging evaluation or consultation with Pulmonology or Thoracic Surgery recommended. These results will be called to the ordering clinician or representative by the Radiologist Assistant, and communication documented in the PACS or [employer]. 2. New 4.4 cm lingular nodule.  Recommend attention on follow-up. 3. Cirrhosis. 4.  Aortic atherosclerosis (ICD10-I70.0). 5.  Emphysema    01/06/2024 Initial Diagnosis   High grade neuroendocrine carcinoma of lung   Patient was evaluated by pulmonology and underwent biopsy via bronchoscopy. A.  LEFT LUNG, UPPER LOBE, NODULE, FINE NEEDLE ASPIRATION  BIOPSY:  - Malignant  - Non-small cell carcinoma (see comment)   B.  LEFT LUNG, UPPER LOBE, NODULE, BRUSHING:  - Malignant  - Non-small cell carcinoma (see comment)   ADDENDUM 1 :  Immunohistochemistry is performed to classify the carcinoma.  The tumor cells are negative with TTF-1 and p40.  Additional  immunohistochemistry will be performed and reported in a subsequent  addendum.   ADDENDUM 2: Additional immunohistochemistry is performed to better  characterize the carcinoma and the malignant cells are positive with  synaptophysin and negative with cytokeratin  5/6, CD56 and chromogranin.  Ki-67 shows a moderately increased proliferation rate.  The  presence of  synaptophysin positivity is most consistent with intermediate to  high-grade neuroendocrine carcinoma   -- I discussed with pathologist and confirmed the large cell neuroendocrine carcinoma histology.  D. LYMPH NODE, STATION 7, FINE NEEDLE ASPIRATION:  - Negative for malignancy  - Compatible with a benign lymph node   E. LYMPH NODE, STATION 11L, FINE NEEDLE ASPIRATION:  - Negative for malignancy  - Compatible with a benign lymph node   F. LUNG, LUL, LAVAGE:   FINAL MICROSCOPIC DIAGNOSIS:  - No malignant cells identified  - Pulmonary macrophages and benign bronchial cells    01/06/2024 Cancer Staging   Staging form: Lung, AJCC V9 - Clinical: Stage IA2 (cT1b, cN0, cM0) - Signed by Shane Forbes, MD on 01/06/2024   02/05/2024 - 02/17/2024 Radiation Therapy   SBRT x 5 treatment to lung    03/03/2024 Imaging   MRI brain w wo contrast  Regression of nodular enhancement at the left basal ganglia, likely vessel or recent small vessel infarct. No indication of metastatic disease on today's scan.   03/09/2024 -  Chemotherapy   Patient is on Treatment Plan : LUNG  Carboplatin  (AUC 5) D1 + Etoposide  (100) D1-3 q21d       Denies unintentional weight loss, hemoptysis headache double vision. He has a history of smoking, currently smoking about a pack of cigarettes per day.  Denies any alcohol use. He experiences ongoing pain and swelling in his right shoulder, where he had a reverse shoulder replacement approximately four to five years ago. The PET scan showed activity in this area, raising concerns about possible infection or chronic inflammation. He has limited range of motion and is unable to lift his arm beyond a certain point. He was seen by cardiology recently was cleared to proceed with surgery.  However due to decreased pulmonary function, he was felt not a surgical candidate. Patient presents to discuss nonsurgical alternative treatments.  He has met with radiation oncology Dr.  Chrystal and has been offered SBRT.  INTERVAL HISTORY [name] is a 68 y.o. 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  Patient was accompanied by family member today. Tolerated Carboplatin  Etoposide   His appetite is good and patient's weight is stable.  During the interval he reports one episode of "slide down" and felt nausea. Nausea resolved spontaneously.  Today he has no new complaints. Denies nausea, vomiting, diarrhea.    MEDICAL HISTORY:  Past Medical History:  Diagnosis Date   Anemia    Anginal pain (HCC)    Arthritis    Atherosclerotic peripheral vascular disease with intermittent claudication (HCC)    Bronchitis    h/o   CHF (congestive heart failure) (HCC)    Cirrhosis, non-alcoholic (HCC)    Coronary artery disease    Depression    Diabetes mellitus without complication (HCC)    diet controlled   Difficult intubation    Pt Denies   Dyspnea    with exertion   GERD (gastroesophageal reflux disease)    Heart failure with mid-range ejection fraction (HFmEF) (HCC)    History of MRSA infection 2010   Hx of CABG    Hyperlipidemia    Hypertension    Insomnia    Ischemic cardiomyopathy    Liver cirrhosis (HCC)    Peripheral vascular disease (HCC)  Stomach ulcer    Stomach ulcer    Wears hearing aid in both ears     SURGICAL HISTORY: Past Surgical History:  Procedure Laterality Date   ANTERIOR CERVICAL DECOMP/DISCECTOMY FUSION N/A 01/28/2018   Procedure: ANTERIOR CERVICAL DECOMPRESSION/DISCECTOMY FUSION 2 LEVELS C3-5;  Surgeon: Jodeen Munch, MD;  Location: [facility] ORS;  Service: Neurosurgery;  Laterality: N/A;   BRONCHIAL BIOPSY  12/25/2023   Procedure: BRONCHIAL BIOPSIES;  Surgeon: Vergia Glasgow, MD;  Location: [facility];  Service: Pulmonary;;   BRONCHIAL BRUSHINGS  12/25/2023   Procedure: BRONCHIAL BRUSHINGS;  Surgeon: Vergia Glasgow, MD;   Location: [facility] ENDOSCOPY;  Service: Pulmonary;;   BRONCHIAL NEEDLE ASPIRATION BIOPSY  12/25/2023   Procedure: BRONCHIAL NEEDLE ASPIRATION BIOPSIES;  Surgeon: Vergia Glasgow, MD;  Location: [facility]OS[facility];  Service: Pulmonary;;   BRONCHIAL WASHINGS  12/25/2023   Procedure: BRONCHIAL WASHINGS;  Surgeon: Vergia Glasgow, MD;  Location: [facility];  Service: Pulmonary;;   COLON SURGERY  2004   due to diverticulitis.  Partail colectomy with Anastamosis   COLONOSCOPY N/A 03/27/2023   Procedure: COLONOSCOPY;  Surgeon: Selena Daily, MD;  Location: [facility] ENDOSCOPY;  Service: Gastroenterology;  Laterality: N/A;   COLONOSCOPY WITH PROPOFOL  N/A 03/26/2023   Procedure: COLONOSCOPY WITH PROPOFOL ;  Surgeon: Selena Daily, MD;  Location: [facility] ENDOSCOPY;  Service: Gastroenterology;  Laterality: N/A;   CORONARY ARTERY BYPASS GRAFT     ESOPHAGOGASTRODUODENOSCOPY (EGD) WITH PROPOFOL  N/A 03/26/2023   Procedure: ESOPHAGOGASTRODUODENOSCOPY (EGD) WITH PROPOFOL ;  Surgeon: Selena Daily, MD;  Location: [facility] ENDOSCOPY;  Service: Gastroenterology;  Laterality: N/A;   FINE NEEDLE ASPIRATION  12/25/2023   Procedure: FINE NEEDLE ASPIRATION (FNA) LINEAR;  Surgeon: Vergia Glasgow, MD;  Location: [facility];  Service: Pulmonary;;   HAND TENDON SURGERY Right    right thumb   LUMBAR LAMINECTOMY/DECOMPRESSION MICRODISCECTOMY N/A 09/01/2019   Procedure: LUMBAR LAMINECTOMY/DECOMPRESSION MICRODISCECTOMY 3 LEVELS L2-L5;  Surgeon: Jodeen Munch, MD;  Location: [facility] ORS;  Service: Neurosurgery;  Laterality: N/A;   neck fusion     SHOULDER ARTHROSCOPY WITH OPEN ROTATOR CUFF REPAIR Right 04/08/2016   Procedure: SHOULDER ARTHROSCOPY WITH OPEN ROTATOR CUFF REPAIR;  Surgeon: Marlynn Singer, MD;  Location: [facility] ORS;  Service: Orthopedics;  Laterality: Right;   TONSILLECTOMY     TRANSFORAMINAL LUMBAR INTERBODY FUSION W/ MIS 1 LEVEL N/A 09/01/2019   Procedure: MINIMALLY INVASIVE (MIS) TRANSFORAMINAL LUMBAR INTERBODY  FUSION (TLIF) 1 LEVEL L5-S1;  Surgeon: Jodeen Munch, MD;  Location: [facility] ORS;  Service: Neurosurgery;  Laterality: N/A;   TYMPANOPLASTY Right 08/19/2023   Procedure: TYMPANOPLASTY WITH REMOVAL OF CHOLESTEATOMA;  Surgeon: Von Grumbling, MD;  Location: [facility];  Service: ENT;  Laterality: Right;   VIDEO BRONCHOSCOPY WITH ENDOBRONCHIAL ULTRASOUND  12/25/2023   Procedure: VIDEO BRONCHOSCOPY WITH ENDOBRONCHIAL ULTRASOUND;  Surgeon: Vergia Glasgow, MD;  Location: [facility];  Service: Pulmonary;;   VIDEO BRONCHOSCOPY WITH RADIAL ENDOBRONCHIAL ULTRASOUND  12/25/2023   Procedure: VIDEO BRONCHOSCOPY WITH RADIAL ENDOBRONCHIAL ULTRASOUND;  Surgeon: Vergia Glasgow, MD;  Location: [facility]OS[facility];  Service: Pulmonary;;    SOCIAL HISTORY: Social History   Socioeconomic History   Marital status: Married    Spouse name: Not on file   Number of children: Not on file   Years of education: Not on file   Highest education level: Not on file  Occupational History   Not on file  Tobacco Use   Smoking status: Every Day    Current packs/day: 1.00    Average packs/day: 1 pack/day for 50.5 years (50.5 ttl pk-yrs)  Types: Cigarettes    Start date: 02/08/1968    Last attempt to quit: 02/07/2018   Smokeless tobacco: Never   Tobacco comments:    1PPD khj 01/07/2024  Vaping Use   Vaping status: Former  Substance and Sexual Activity   Alcohol use: Not Currently    Comment: rarely   Drug use: No   Sexual activity: Yes  Other Topics Concern   Not on file  Social History Narrative   Not on file   Social Drivers of Health   Financial Resource Strain: Not on file  Food Insecurity: No Food Insecurity (01/06/2024)   Hunger Vital Sign    Worried About Running Out of Food in the Last Year: Never true    Ran Out of Food in the Last Year: Never true  Transportation Needs: No Transportation Needs (01/06/2024)   PRAPARE - Administrator, Civil Service (Medical): No    Lack of  Transportation (Non-Medical): No  Physical Activity: Not on file  Stress: No Stress Concern Present (01/06/2024)   [employer] of Occupational Health - Occupational Stress Questionnaire    Feeling of Stress : Not at all  Social Connections: Not on file  Intimate Partner Violence: Not At Risk (01/06/2024)   Humiliation, Afraid, Rape, and Kick questionnaire    Fear of Current or Ex-Partner: No    Emotionally Abused: No    Physically Abused: No    Sexually Abused: No    FAMILY HISTORY: History reviewed. No pertinent family history.  ALLERGIES:  is allergic to penicillins and atorvastatin.  MEDICATIONS:  Current Outpatient Medications  Medication Sig Dispense Refill   albuterol  (VENTOLIN  HFA) 108 (90 Base) MCG/ACT inhaler Inhale 1-2 puffs into the lungs every 6 (six) hours as needed for wheezing or shortness of breath.     amitriptyline  (ELAVIL ) 50 MG tablet Take 50 mg by mouth at bedtime.     amLODipine (NORVASC) 2.5 MG tablet Take 1 tablet by mouth at bedtime.     aspirin 325 MG tablet Take 325 mg by mouth daily.     Blood Glucose Monitoring Suppl (ACCU-CHEK GUIDE ME) w/Device KIT      calcium-vitamin D  (OSCAL WITH D) 500-5 MG-MCG tablet Take 2 tablets by mouth daily. 60 tablet 2   ciprofloxacin -dexamethasone  (CIPRODEX ) OTIC suspension Place 4 drops into both ears 2 (two) times daily for 5 days. DOS 08/19/23. 7.5 mL 0   citalopram  (CELEXA ) 40 MG tablet Take 40 mg by mouth every morning.      dexamethasone  (DECADRON ) 4 MG tablet Take 2 tablets daily for 2 days, on days 4 and 5.Take with food. Every 21 days. 30 tablet 1   DULoxetine (CYMBALTA) 60 MG capsule Take 60 mg by mouth 2 (two) times daily.     fluticasone (FLONASE) 50 MCG/ACT nasal spray Place 2 sprays into both nostrils daily.     isosorbide mononitrate (IMDUR) 30 MG 24 hr tablet Take 30 mg by mouth daily.     lansoprazole (PREVACID) 15 MG capsule Take 15 mg by mouth daily at 12 noon.     loratadine (CLARITIN) 10 MG tablet  Take 10 mg by mouth daily.     magic mouthwash (nystatin , lidocaine , diphenhydrAMINE, alum & mag hydroxide) suspension Take 5 mLs by mouth 3 (three) times daily as needed for mouth pain. 300 mL 1   meloxicam  (MOBIC ) 15 MG tablet Take 15 mg by mouth every morning.     metFORMIN (GLUCOPHAGE-XR) 500 MG 24 hr tablet Take 500  mg by mouth 2 (two) times daily.     nitroGLYCERIN  (NITROSTAT ) 0.4 MG SL tablet Place 0.4 mg under the tongue every 5 (five) minutes as needed for chest pain.     ofloxacin  (OCUFLOX ) 0.3 % ophthalmic solution Place 4 drops into the left ear 2 (two) times daily. 5 mL 5   omeprazole  (PRILOSEC ) 20 MG capsule Take 20 mg by mouth daily.     ondansetron  (ZOFRAN ) 8 MG tablet Take 1 tablet (8 mg total) by mouth every 8 (eight) hours as needed for nausea or vomiting. Start on third day after chemotherapy. 30 tablet 1   pregabalin (LYRICA) 300 MG capsule Take 300 mg by mouth 2 (two) times daily.     prochlorperazine  (COMPAZINE ) 10 MG tablet Take 1 tablet (10 mg total) by mouth every 6 (six) hours as needed for nausea or vomiting (Nausea or vomiting). 30 tablet 1   rosuvastatin (CRESTOR) 20 MG tablet Take 20 mg by mouth daily.     spironolactone (ALDACTONE) 25 MG tablet Take 12.5 mg by mouth daily.     tamsulosin (FLOMAX) 0.4 MG CAPS capsule Take 0.4 mg by mouth daily after supper.     umeclidinium-vilanterol (ANORO ELLIPTA ) 62.5-25 MCG/ACT AEPB Inhale 1 puff into the lungs daily. 30 each 11   valsartan (DIOVAN) 320 MG tablet Take 320 mg by mouth daily.     doxycycline (ADOXA) 100 MG tablet Take 100 mg by mouth 2 (two) times daily. (Patient not taking: Reported on 04/20/2024)     No current facility-administered medications for this visit.   Facility-Administered Medications Ordered in Other Visits  Medication Dose Route Frequency Provider Last Rate Last Admin   0.9 %  sodium chloride  infusion   Intravenous Continuous Shane Forbes, MD   Stopped at 04/20/24 1233    Review of Systems   Constitutional:  Negative for appetite change, chills, fatigue, fever and unexpected weight change.  HENT:   Negative for hearing loss and voice change.   Eyes:  Negative for eye problems and icterus.  Respiratory:  Negative for chest tightness, cough and shortness of breath.   Cardiovascular:  Negative for chest pain and leg swelling.  Gastrointestinal:  Negative for abdominal distention and abdominal pain.  Endocrine: Negative for hot flashes.  Genitourinary:  Negative for difficulty urinating, dysuria and frequency.   Musculoskeletal:  Positive for arthralgias.       Right shoulder pain with limited motion  Skin:  Negative for itching and rash.  Neurological:  Negative for light-headedness and numbness.  Hematological:  Negative for adenopathy. Does not bruise/bleed easily.  Psychiatric/Behavioral:  Negative for confusion.      PHYSICAL EXAMINATION: ECOG PERFORMANCE STATUS: 0 - Asymptomatic  Vitals:   04/20/24 0932  BP: 124/78  Pulse: 82  Resp: 15  Temp: 98.9 F (37.2 C)  SpO2: 98%   Filed Weights   04/20/24 0932  Weight: 184 lb (83.5 kg)    Physical Exam Constitutional:      General: He is not in acute distress.    Appearance: He is not diaphoretic.  HENT:     Head: Normocephalic and atraumatic.  Eyes:     General: No scleral icterus. Cardiovascular:     Rate and Rhythm: Normal rate.  Pulmonary:     Effort: Pulmonary effort is normal. No respiratory distress.  Abdominal:     Tenderness: There is no abdominal tenderness.  Musculoskeletal:     Cervical back: Normal range of motion and neck supple.  Comments: Right shoulder limited range of motion.  Skin:    Findings: No erythema or rash.  Neurological:     Mental Status: He is alert and oriented to person, place, and time. Mental status is at baseline.     Motor: No abnormal muscle tone.  Psychiatric:        Mood and Affect: Mood and affect normal.      LABORATORY DATA:  I have reviewed the data as  listed    Latest Ref Rng & Units 04/20/2024    9:03 AM 03/30/2024    8:16 AM 03/16/2024    9:20 AM  CBC  WBC 4.0 - 10.5 K/uL 11.5  11.0  4.8   Hemoglobin 13.0 - 17.0 g/dL 16.1  09.6  04.5   Hematocrit 39.0 - 52.0 % 34.2  40.3  40.6   Platelets 150 - 400 K/uL 145  182  96       Latest Ref Rng & Units 04/20/2024    9:03 AM 03/30/2024    8:16 AM 03/16/2024    9:19 AM  CMP  Glucose 70 - 99 mg/dL 409  811  914   BUN 8 - 23 mg/dL 18  15  24    Creatinine 0.61 - 1.24 mg/dL 7.82  9.56  2.13   Sodium 135 - 145 mmol/L 135  136  133   Potassium 3.5 - 5.1 mmol/L 3.9  4.1  5.0   Chloride 98 - 111 mmol/L 102  101  98   CO2 22 - 32 mmol/L 23  24  25    Calcium 8.9 - 10.3 mg/dL 8.2  8.9  8.2   Total Protein 6.5 - 8.1 g/dL 6.6  7.4  6.5   Total Bilirubin 0.0 - 1.2 mg/dL 0.6  0.7  1.4   Alkaline Phos 38 - 126 U/L 135  181  148   AST 15 - 41 U/L 38  31  65   ALT 0 - 44 U/L 49  46  90      RADIOGRAPHIC STUDIES: I have personally reviewed the radiological images as listed and agreed with the findings in the report. US  ABDOMEN LIMITED RUQ (LIVER/GB) Result Date: 03/29/2024 CLINICAL DATA:  cirrhosis of liver EXAM: ULTRASOUND ABDOMEN LIMITED RIGHT UPPER QUADRANT COMPARISON:  September 25, 2023 FINDINGS: Gallbladder: No gallstones or wall thickening visualized. No sonographic Murphy sign noted by sonographer. Common bile duct: Diameter: Visualized portion measures 2 mm, within normal limits. Liver: No focal lesion identified. Coarsened in parenchymal echogenicity. Nodular liver contour. Portal vein is patent on color Doppler imaging with normal direction of blood flow towards the liver. Other: None. IMPRESSION: Cirrhotic liver morphology. No focal lesion identified. Electronically Signed   By: Clancy Crimes M.D.   On: 03/29/2024 09:07

## 2024-04-20 NOTE — Assessment & Plan Note (Addendum)
 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 Not a surgical candidate.  S/p SBRT.  Recommend adjuvant carboplatin  and Etoposide  for 4 cycles. .  Labs are reviewed and discussed with patient. He tolerates treatment well.  Proceed with Cytcle 3 D1carboplatin and D1-3 Etoposide  with GCSF on D5. Recommend claritin 10mg  daily x 4 days.    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  He is on Aspirin 325mg  daily and Statin.

## 2024-04-20 NOTE — Assessment & Plan Note (Signed)
 IV Calcium gluconate 2g x 1.  Recommend calcium 1000-1200mg  daily with Vitamin D  supplementation.

## 2024-04-20 NOTE — Patient Instructions (Signed)
 [facility] - A DEPT OF [facility]  Discharge Instructions: Thank you for choosing [facility] to provide your oncology and hematology care.  If you have a lab appointment with the Cancer Center, please go directly to the Cancer Center and check in at the registration area.  Wear comfortable clothing and clothing appropriate for easy access to any Portacath or PICC line.   We strive to give you quality time with your provider. You may need to reschedule your appointment if you arrive late (15 or more minutes).  Arriving late affects you and other patients whose appointments are after yours.  Also, if you miss three or more appointments without notifying the office, you may be dismissed from the clinic at the provider's discretion.      For prescription refill requests, have your pharmacy contact our office and allow 72 hours for refills to be completed.    Today you received the following chemotherapy and/or immunotherapy agents- etoposide , carboplatin       To help prevent nausea and vomiting after your treatment, we encourage you to take your nausea medication as directed.  BELOW ARE SYMPTOMS THAT SHOULD BE REPORTED IMMEDIATELY: *FEVER GREATER THAN 100.4 F (38 C) OR HIGHER *CHILLS OR SWEATING *NAUSEA AND VOMITING THAT IS NOT CONTROLLED WITH YOUR NAUSEA MEDICATION *UNUSUAL SHORTNESS OF BREATH *UNUSUAL BRUISING OR BLEEDING *URINARY PROBLEMS (pain or burning when urinating, or frequent urination) *BOWEL PROBLEMS (unusual diarrhea, constipation, pain near the anus) TENDERNESS IN MOUTH AND THROAT WITH OR WITHOUT PRESENCE OF ULCERS (sore throat, sores in mouth, or a toothache) UNUSUAL RASH, SWELLING OR PAIN  UNUSUAL VAGINAL DISCHARGE OR ITCHING   Items with * indicate a potential emergency and should be followed up as soon as possible or go to the Emergency Department if any problems should occur.  Please show the CHEMOTHERAPY ALERT CARD or  IMMUNOTHERAPY ALERT CARD at check-in to the Emergency Department and triage nurse.  Should you have questions after your visit or need to cancel or reschedule your appointment, please contact [facility] - A DEPT OF [facility]  [phone] and follow the prompts.  Office hours are 8:00 a.m. to 4:30 p.m. Monday - Friday. Please note that voicemails left after 4:00 p.m. may not be returned until the following business day.  We are closed weekends and major holidays. You have access to a nurse at all times for urgent questions. Please call the main number to the clinic [phone] and follow the prompts.  For any non-urgent questions, you may also contact your provider using MyChart. We now offer e-Visits for anyone 72 and older to request care online for non-urgent symptoms. For details visit [email or web address].   Also download the MyChart app! Go to the app store, search "MyChart", open the app, select [place], and log in with your MyChart username and password.

## 2024-04-20 NOTE — Assessment & Plan Note (Signed)
 Previously have diet controlled DM.  Glucose level in 300s. Partially due to steroid use.  Recommend patient to start diabetic diet. Refer to nutritionist for education.  He has started on DM treatment - metformin

## 2024-04-20 NOTE — Assessment & Plan Note (Signed)
 Recommend otc melatonin 5mg  at bedtime PRN.  Recommend patient to decrease caffeine intake

## 2024-04-21 ENCOUNTER — Inpatient Hospital Stay

## 2024-04-21 VITALS — BP 119/66 | HR 92 | Temp 97.3°F | Resp 18

## 2024-04-21 DIAGNOSIS — G4700 Insomnia, unspecified: Secondary | ICD-10-CM | POA: Diagnosis not present

## 2024-04-21 DIAGNOSIS — Z923 Personal history of irradiation: Secondary | ICD-10-CM | POA: Diagnosis not present

## 2024-04-21 DIAGNOSIS — Z5189 Encounter for other specified aftercare: Secondary | ICD-10-CM | POA: Diagnosis not present

## 2024-04-21 DIAGNOSIS — F17210 Nicotine dependence, cigarettes, uncomplicated: Secondary | ICD-10-CM | POA: Diagnosis not present

## 2024-04-21 DIAGNOSIS — Z5111 Encounter for antineoplastic chemotherapy: Secondary | ICD-10-CM | POA: Diagnosis not present

## 2024-04-21 DIAGNOSIS — E1165 Type 2 diabetes mellitus with hyperglycemia: Secondary | ICD-10-CM | POA: Diagnosis not present

## 2024-04-21 DIAGNOSIS — C7A1 Malignant poorly differentiated neuroendocrine tumors: Secondary | ICD-10-CM | POA: Diagnosis not present

## 2024-04-21 DIAGNOSIS — Z7982 Long term (current) use of aspirin: Secondary | ICD-10-CM | POA: Diagnosis not present

## 2024-04-21 MED ORDER — DEXAMETHASONE SODIUM PHOSPHATE 10 MG/ML IJ SOLN
10.0000 mg | Freq: Once | INTRAMUSCULAR | Status: AC
  Administered 2024-04-21: 10 mg via INTRAVENOUS

## 2024-04-21 MED ORDER — SODIUM CHLORIDE 0.9 % IV SOLN
INTRAVENOUS | Status: DC
  Filled 2024-04-21: qty 250

## 2024-04-21 MED ORDER — SODIUM CHLORIDE 0.9 % IV SOLN
100.0000 mg/m2 | Freq: Once | INTRAVENOUS | Status: AC
  Administered 2024-04-21: 200 mg via INTRAVENOUS
  Filled 2024-04-21: qty 10

## 2024-04-22 ENCOUNTER — Inpatient Hospital Stay

## 2024-04-22 VITALS — BP 150/66 | HR 65 | Temp 98.1°F

## 2024-04-22 DIAGNOSIS — Z7982 Long term (current) use of aspirin: Secondary | ICD-10-CM | POA: Diagnosis not present

## 2024-04-22 DIAGNOSIS — C7A1 Malignant poorly differentiated neuroendocrine tumors: Secondary | ICD-10-CM | POA: Diagnosis not present

## 2024-04-22 DIAGNOSIS — F17210 Nicotine dependence, cigarettes, uncomplicated: Secondary | ICD-10-CM | POA: Diagnosis not present

## 2024-04-22 DIAGNOSIS — Z5111 Encounter for antineoplastic chemotherapy: Secondary | ICD-10-CM | POA: Diagnosis not present

## 2024-04-22 DIAGNOSIS — E1165 Type 2 diabetes mellitus with hyperglycemia: Secondary | ICD-10-CM | POA: Diagnosis not present

## 2024-04-22 DIAGNOSIS — G4700 Insomnia, unspecified: Secondary | ICD-10-CM | POA: Diagnosis not present

## 2024-04-22 DIAGNOSIS — Z5189 Encounter for other specified aftercare: Secondary | ICD-10-CM | POA: Diagnosis not present

## 2024-04-22 DIAGNOSIS — Z923 Personal history of irradiation: Secondary | ICD-10-CM | POA: Diagnosis not present

## 2024-04-22 MED ORDER — DEXAMETHASONE SODIUM PHOSPHATE 10 MG/ML IJ SOLN
10.0000 mg | Freq: Once | INTRAMUSCULAR | Status: AC
  Administered 2024-04-22: 10 mg via INTRAVENOUS
  Filled 2024-04-22: qty 1

## 2024-04-22 MED ORDER — SODIUM CHLORIDE 0.9 % IV SOLN
INTRAVENOUS | Status: DC
  Filled 2024-04-22: qty 250

## 2024-04-22 MED ORDER — SODIUM CHLORIDE 0.9 % IV SOLN
100.0000 mg/m2 | Freq: Once | INTRAVENOUS | Status: AC
  Administered 2024-04-22: 200 mg via INTRAVENOUS
  Filled 2024-04-22: qty 10

## 2024-04-23 ENCOUNTER — Inpatient Hospital Stay

## 2024-04-23 DIAGNOSIS — Z5111 Encounter for antineoplastic chemotherapy: Secondary | ICD-10-CM | POA: Diagnosis not present

## 2024-04-23 DIAGNOSIS — C7A1 Malignant poorly differentiated neuroendocrine tumors: Secondary | ICD-10-CM | POA: Diagnosis not present

## 2024-04-23 DIAGNOSIS — Z7982 Long term (current) use of aspirin: Secondary | ICD-10-CM | POA: Diagnosis not present

## 2024-04-23 DIAGNOSIS — E1165 Type 2 diabetes mellitus with hyperglycemia: Secondary | ICD-10-CM | POA: Diagnosis not present

## 2024-04-23 DIAGNOSIS — Z923 Personal history of irradiation: Secondary | ICD-10-CM | POA: Diagnosis not present

## 2024-04-23 DIAGNOSIS — F17210 Nicotine dependence, cigarettes, uncomplicated: Secondary | ICD-10-CM | POA: Diagnosis not present

## 2024-04-23 DIAGNOSIS — Z5189 Encounter for other specified aftercare: Secondary | ICD-10-CM | POA: Diagnosis not present

## 2024-04-23 DIAGNOSIS — G4700 Insomnia, unspecified: Secondary | ICD-10-CM | POA: Diagnosis not present

## 2024-04-23 MED ORDER — PEGFILGRASTIM-JMDB 6 MG/0.6ML ~~LOC~~ SOSY
6.0000 mg | PREFILLED_SYRINGE | Freq: Once | SUBCUTANEOUS | Status: AC
  Administered 2024-04-23: 6 mg via SUBCUTANEOUS
  Filled 2024-04-23: qty 0.6

## 2024-04-27 ENCOUNTER — Other Ambulatory Visit: Payer: Self-pay

## 2024-04-27 ENCOUNTER — Telehealth: Payer: Self-pay | Admitting: *Deleted

## 2024-04-27 NOTE — Telephone Encounter (Signed)
 Amber the daughter called today saying that he is having those blisters in his mouth she wanted to know what is causing it and it is usually the side effects of that treatment.  He is exhausted.  I let the daughter know that that happens for a lot of the patients.  He is having some leg hurting-he told his daughter that it felt like they were just heavy.  He would still want patients to get up and move around a little bit.  Like let the patient sit or in the bed for a couple hours in the daytime and then get up and just walk from 1 part of your house to the other part of your house just to get your legs going.  I also asked if he gets those chocolate or vanilla shakes that has all the protein in it that is very good for him since his mouth is so sore the liquids is better to get down.  Daughter says that she will come over and get the medicated mouthwash today before 6:00

## 2024-05-11 ENCOUNTER — Encounter: Payer: Self-pay | Admitting: Oncology

## 2024-05-11 ENCOUNTER — Inpatient Hospital Stay

## 2024-05-11 ENCOUNTER — Inpatient Hospital Stay (HOSPITAL_BASED_OUTPATIENT_CLINIC_OR_DEPARTMENT_OTHER): Admitting: Oncology

## 2024-05-11 ENCOUNTER — Inpatient Hospital Stay: Attending: Oncology

## 2024-05-11 VITALS — BP 137/67 | HR 67

## 2024-05-11 VITALS — BP 122/71 | HR 69 | Temp 97.1°F | Resp 16 | Wt 182.0 lb

## 2024-05-11 DIAGNOSIS — D6481 Anemia due to antineoplastic chemotherapy: Secondary | ICD-10-CM | POA: Diagnosis not present

## 2024-05-11 DIAGNOSIS — Z923 Personal history of irradiation: Secondary | ICD-10-CM | POA: Diagnosis not present

## 2024-05-11 DIAGNOSIS — Z5189 Encounter for other specified aftercare: Secondary | ICD-10-CM | POA: Insufficient documentation

## 2024-05-11 DIAGNOSIS — E1165 Type 2 diabetes mellitus with hyperglycemia: Secondary | ICD-10-CM | POA: Diagnosis not present

## 2024-05-11 DIAGNOSIS — F17210 Nicotine dependence, cigarettes, uncomplicated: Secondary | ICD-10-CM | POA: Diagnosis not present

## 2024-05-11 DIAGNOSIS — C7A1 Malignant poorly differentiated neuroendocrine tumors: Secondary | ICD-10-CM

## 2024-05-11 DIAGNOSIS — Z7982 Long term (current) use of aspirin: Secondary | ICD-10-CM | POA: Diagnosis not present

## 2024-05-11 DIAGNOSIS — Z5111 Encounter for antineoplastic chemotherapy: Secondary | ICD-10-CM

## 2024-05-11 DIAGNOSIS — D7589 Other specified diseases of blood and blood-forming organs: Secondary | ICD-10-CM | POA: Insufficient documentation

## 2024-05-11 LAB — CMP (CANCER CENTER ONLY)
ALT: 34 U/L (ref 0–44)
AST: 30 U/L (ref 15–41)
Albumin: 3.7 g/dL (ref 3.5–5.0)
Alkaline Phosphatase: 120 U/L (ref 38–126)
Anion gap: 10 (ref 5–15)
BUN: 18 mg/dL (ref 8–23)
CO2: 23 mmol/L (ref 22–32)
Calcium: 9.3 mg/dL (ref 8.9–10.3)
Chloride: 103 mmol/L (ref 98–111)
Creatinine: 0.86 mg/dL (ref 0.61–1.24)
GFR, Estimated: >60 mL/min (ref >60)
Glucose, Bld: 311 mg/dL — ABNORMAL HIGH (ref 70–99)
Potassium: 4.5 mmol/L (ref 3.5–5.1)
Sodium: 136 mmol/L (ref 135–145)
Total Bilirubin: 0.6 mg/dL (ref 0.0–1.2)
Total Protein: 6.6 g/dL (ref 6.5–8.1)

## 2024-05-11 LAB — CBC WITH DIFFERENTIAL (CANCER CENTER ONLY)
Abs Immature Granulocytes: 0.27 10*3/uL — ABNORMAL HIGH (ref 0.00–0.07)
Basophils Absolute: 0.1 10*3/uL (ref 0.0–0.1)
Basophils Relative: 1 %
Eosinophils Absolute: 0.2 10*3/uL (ref 0.0–0.5)
Eosinophils Relative: 2 %
HCT: 32.6 % — ABNORMAL LOW (ref 39.0–52.0)
Hemoglobin: 10.9 g/dL — ABNORMAL LOW (ref 13.0–17.0)
Immature Granulocytes: 3 %
Lymphocytes Relative: 15 %
Lymphs Abs: 1.6 10*3/uL (ref 0.7–4.0)
MCH: 34.1 pg — ABNORMAL HIGH (ref 26.0–34.0)
MCHC: 33.4 g/dL (ref 30.0–36.0)
MCV: 101.9 fL — ABNORMAL HIGH (ref 80.0–100.0)
Monocytes Absolute: 0.7 10*3/uL (ref 0.1–1.0)
Monocytes Relative: 7 %
Neutro Abs: 7.5 10*3/uL (ref 1.7–7.7)
Neutrophils Relative %: 72 %
Platelet Count: 144 10*3/uL — ABNORMAL LOW (ref 150–400)
RBC: 3.2 MIL/uL — ABNORMAL LOW (ref 4.22–5.81)
RDW: 17.7 % — ABNORMAL HIGH (ref 11.5–15.5)
WBC Count: 10.4 10*3/uL (ref 4.0–10.5)
nRBC: 0 % (ref 0.0–0.2)

## 2024-05-11 MED ORDER — DEXAMETHASONE SODIUM PHOSPHATE 10 MG/ML IJ SOLN
10.0000 mg | Freq: Once | INTRAMUSCULAR | Status: AC
  Administered 2024-05-11: 10 mg via INTRAVENOUS
  Filled 2024-05-11: qty 1

## 2024-05-11 MED ORDER — PALONOSETRON HCL INJECTION 0.25 MG/5ML
0.2500 mg | Freq: Once | INTRAVENOUS | Status: AC
  Administered 2024-05-11: 0.25 mg via INTRAVENOUS
  Filled 2024-05-11: qty 5

## 2024-05-11 MED ORDER — SODIUM CHLORIDE 0.9 % IV SOLN
505.0000 mg | Freq: Once | INTRAVENOUS | Status: AC
  Administered 2024-05-11: 510 mg via INTRAVENOUS
  Filled 2024-05-11: qty 51

## 2024-05-11 MED ORDER — SODIUM CHLORIDE 0.9 % IV SOLN
150.0000 mg | Freq: Once | INTRAVENOUS | Status: AC
  Administered 2024-05-11: 150 mg via INTRAVENOUS
  Filled 2024-05-11: qty 150

## 2024-05-11 MED ORDER — SODIUM CHLORIDE 0.9 % IV SOLN
100.0000 mg/m2 | Freq: Once | INTRAVENOUS | Status: AC
  Administered 2024-05-11: 200 mg via INTRAVENOUS
  Filled 2024-05-11: qty 10

## 2024-05-11 MED ORDER — SODIUM CHLORIDE 0.9 % IV SOLN
INTRAVENOUS | Status: DC
  Filled 2024-05-11: qty 250

## 2024-05-11 NOTE — Progress Notes (Signed)
 Nutrition Follow-up:  Patient with lung cancer.  Patient receiving carboplatin  and etoposide .  Met with patient during infusion.  Reports that his appetite is good.  Had issues with mucositis but improved after using magic mouth wash.  Appetite was down during episode of mucositis.  Continues to drink regular Mt dew.  Eating mostly 3 meals a day of variety of foods.  Patient does not like oral nutrition supplements    Medications: MMW  Labs: glucose 311  Anthropometrics:   Weight 182 lb today  181 lb 12.8 oz on 5/6 183 lb 8 oz on 4/15 175-180 lb UBW   NUTRITION DIAGNOSIS: Food and nutrition related knowledge deficit stable    INTERVENTION:  Reviewed importance of choosing sugar free beverages to help blood glucose Discussed foods to choose with mucositis.  Has MMW on hand.     MONITORING, EVALUATION, GOAL: weight trends, intake   NEXT VISIT: as needed  Wanda Rideout B. Zollie Hipp, CSO, LDN Registered Dietitian [phone]

## 2024-05-11 NOTE — Progress Notes (Signed)
 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 Not a surgical candidate.  S/p SBRT.  Recommend adjuvant carboplatin  and Etoposide  for 4 cycles. .  Labs are reviewed and discussed with patient. He tolerates treatment well.  Proceed with Cytcle 4 D1carboplatin and D1-3 Etoposide  with GCSF on D5. Recommend claritin 10mg  daily x 4 days.  CT chest w contrast is scheduled on 7/28.  He will follow up with me afterwards  Previously found 3mm nodular enhancement at the left basal ganglia has regression, likely small vessel infarct, less likely due to mets.  He is on Aspirin 325mg  daily and Statin.    Hypocalcemia conitnue calcium  1000-1200mg  daily with Vitamin D  supplementation.   Hyperglycemia due to diabetes mellitus (HCC) Glucose level in 300s. Partially due to steroid use, as well as non compliant with diabetic diet. We discussed about avoiding high sugar content food and beverages.  He is DM treatment - metformin 500mg  BID.    Encounter for antineoplastic chemotherapy Chemotherapy plan as listed above  Anemia due to antineoplastic chemotherapy Monitor levels.    Orders Placed This Encounter  Procedures   CBC with Differential (Cancer Center Only)    Standing Status:   Future    Expected Date:   06/29/2024    Expiration Date:   09/27/2024   CMP (Cancer Center only)    Standing Status:   Future    Expected Date:   06/29/2024    Expiration Date:   09/27/2024   Follow-up in August 2025  All questions were answered. The patient knows to call the clinic with any problems, questions or concerns.  [name], MD, PhD [facility] Hematology Oncology 05/11/2024    HISTORY OF PRESENTING ILLNESS:  [name] 68 y.o. male presents to establish  care for stage I large cell high-grade neuroendocrine carcinoma of lung. I have reviewed his chart and materials related to his cancer extensively and collaborated history with the patient. Summary of oncologic history is as follows: Oncology History  High grade neuroendocrine carcinoma of lung (HCC)  10/28/2023 Imaging   PET scan showed 1. Intense metabolic activity associated with a LEFT upper lobe pulmonary nodule consistent primary bronchogenic carcinoma. 2. No evidence of metastatic adenopathy in the mediastinum. No distant metastatic disease. 3. Intense metabolic activity associated with the RIGHT shoulder versus arthroplasty. Recommend correlation for loosening or infection. 4. Cirrhotic morphology of the liver. 5. Post CABG.   11/25/2023 Imaging   CT lung nodule   1. Enlarging microlobulated lingular nodule, now 15.9 mm in size. Lung-RADS 4B, suspicious. Additional imaging evaluation or consultation with Pulmonology or Thoracic Surgery recommended. These results will be called to the ordering clinician or representative by the Radiologist Assistant, and communication documented in the PACS or [employer]. 2. New 4.4 cm lingular nodule.  Recommend attention on follow-up. 3. Cirrhosis. 4.  Aortic atherosclerosis (ICD10-I70.0). 5.  Emphysema    01/06/2024 Initial Diagnosis   High grade neuroendocrine carcinoma of lung   Patient was evaluated by pulmonology and underwent biopsy via bronchoscopy. A.  LEFT LUNG, UPPER LOBE, NODULE, FINE NEEDLE ASPIRATION  BIOPSY:  - Malignant  - Non-small cell carcinoma (see comment)   B.  LEFT LUNG, UPPER LOBE, NODULE, BRUSHING:  - Malignant  - Non-small cell carcinoma (see comment)  ADDENDUM 1 :  Immunohistochemistry is performed to classify the carcinoma.  The tumor cells are negative with TTF-1 and p40.  Additional  immunohistochemistry will be performed and reported in a subsequent  addendum.   ADDENDUM 2: Additional  immunohistochemistry is performed to better  characterize the carcinoma and the malignant cells are positive with  synaptophysin and negative with cytokeratin 5/6, CD56 and chromogranin.  Ki-67 shows a moderately increased proliferation rate.  The presence of  synaptophysin positivity is most consistent with intermediate to  high-grade neuroendocrine carcinoma   -- I discussed with pathologist and confirmed the large cell neuroendocrine carcinoma histology.  D. LYMPH NODE, STATION 7, FINE NEEDLE ASPIRATION:  - Negative for malignancy  - Compatible with a benign lymph node   E. LYMPH NODE, STATION 11L, FINE NEEDLE ASPIRATION:  - Negative for malignancy  - Compatible with a benign lymph node   F. LUNG, LUL, LAVAGE:   FINAL MICROSCOPIC DIAGNOSIS:  - No malignant cells identified  - Pulmonary macrophages and benign bronchial cells    01/06/2024 Cancer Staging   Staging form: Lung, AJCC V9 - Clinical: Stage IA2 (cT1b, cN0, cM0) - Signed by [name], MD on 01/06/2024   02/05/2024 - 02/17/2024 Radiation Therapy   SBRT x 5 treatment to lung    03/03/2024 Imaging   MRI brain w wo contrast  Regression of nodular enhancement at the left basal ganglia, likely vessel or recent small vessel infarct. No indication of metastatic disease on today's scan.   03/09/2024 -  Chemotherapy   Patient is on Treatment Plan : LUNG  Carboplatin  (AUC 5) D1 + Etoposide  (100) D1-3 q21d       Denies unintentional weight loss, hemoptysis headache double vision. He has a history of smoking, currently smoking about a pack of cigarettes per day.  Denies any alcohol use. He experiences ongoing pain and swelling in his right shoulder, where he had a reverse shoulder replacement approximately four to five years ago. The PET scan showed activity in this area, raising concerns about possible infection or chronic inflammation. He has limited range of motion and is unable to lift his arm beyond a certain point. He was seen  by cardiology recently was cleared to proceed with surgery.  However due to decreased pulmonary function, he was felt not a surgical candidate. Patient presents to discuss nonsurgical alternative treatments.  He has met with radiation oncology Dr. Chrystal and has been offered SBRT.  INTERVAL HISTORY KEVIN SPACE is a 68 y.o. 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  Patient was accompanied by family member today. Tolerated Carboplatin  Etoposide   His appetite is good and patient's weight is stable.  Appetite is good. Weight is stable. He drinks [facility].   Today he has no new complaints. Denies nausea, vomiting, diarrhea.    MEDICAL HISTORY:  Past Medical History:  Diagnosis Date   Anemia    Anginal pain (HCC)    Arthritis    Atherosclerotic peripheral vascular disease with intermittent claudication (HCC)    Bronchitis    h/o   CHF (congestive heart failure) (HCC)    Cirrhosis, non-alcoholic (HCC)    Coronary artery disease    Depression    Diabetes mellitus without complication (HCC)    diet controlled   Difficult intubation    Pt Denies   Dyspnea    with exertion   GERD (gastroesophageal  reflux disease)    Heart failure with mid-range ejection fraction (HFmEF) (HCC)    History of MRSA infection 2010   Hx of CABG    Hyperlipidemia    Hypertension    Insomnia    Ischemic cardiomyopathy    Liver cirrhosis (HCC)    Peripheral vascular disease (HCC)    Stomach ulcer    Stomach ulcer    Wears hearing aid in both ears     SURGICAL HISTORY: Past Surgical History:  Procedure Laterality Date   ANTERIOR CERVICAL DECOMP/DISCECTOMY FUSION N/A 01/28/2018   Procedure: ANTERIOR CERVICAL DECOMPRESSION/DISCECTOMY FUSION 2 LEVELS C3-5;  Surgeon: Jodeen Munch, MD;  Location: [facility] ORS;  Service: Neurosurgery;  Laterality: N/A;   BRONCHIAL BIOPSY  12/25/2023    Procedure: BRONCHIAL BIOPSIES;  Surgeon: Vergia Glasgow, MD;  Location: [facility];  Service: Pulmonary;;   BRONCHIAL BRUSHINGS  12/25/2023   Procedure: BRONCHIAL BRUSHINGS;  Surgeon: Vergia Glasgow, MD;  Location: [facility] ENDOSCOPY;  Service: Pulmonary;;   BRONCHIAL NEEDLE ASPIRATION BIOPSY  12/25/2023   Procedure: BRONCHIAL NEEDLE ASPIRATION BIOPSIES;  Surgeon: Vergia Glasgow, MD;  Location: [facility];  Service: Pulmonary;;   BRONCHIAL WASHINGS  12/25/2023   Procedure: BRONCHIAL WASHINGS;  Surgeon: Vergia Glasgow, MD;  Location: [facility];  Service: Pulmonary;;   COLON SURGERY  2004   due to diverticulitis.  Partail colectomy with Anastamosis   COLONOSCOPY N/A 03/27/2023   Procedure: COLONOSCOPY;  Surgeon: Selena Daily, MD;  Location: [facility] END[facility]COPY;  Service: Gastroenterology;  Laterality: N/A;   COLONOSCOPY WITH PROPOFOL  N/A 03/26/2023   Procedure: COLONOSCOPY WITH PROPOFOL ;  Surgeon: Selena Daily, MD;  Location: [facility] END[facility]COPY;  Service: Gastroenterology;  Laterality: N/A;   CORONARY ARTERY BYPASS GRAFT     ESOPHAGOGASTRODUODENOSCOPY (EGD) WITH PROPOFOL  N/A 03/26/2023   Procedure: ESOPHAGOGASTRODUODENOSCOPY (EGD) WITH PROPOFOL ;  Surgeon: Selena Daily, MD;  Location: [facility] ENDOSCOPY;  Service: Gastroenterology;  Laterality: N/A;   FINE NEEDLE ASPIRATION  12/25/2023   Procedure: FINE NEEDLE ASPIRATION (FNA) LINEAR;  Surgeon: Vergia Glasgow, MD;  Location: [facility];  Service: Pulmonary;;   HAND TENDON SURGERY Right    right thumb   LUMBAR LAMINECTOMY/DECOMPRESSION MICRODISCECTOMY N/A 09/01/2019   Procedure: LUMBAR LAMINECTOMY/DECOMPRESSION MICRODISCECTOMY 3 LEVELS L2-L5;  Surgeon: Jodeen Munch, MD;  Location: [facility] ORS;  Service: Neurosurgery;  Laterality: N/A;   neck fusion     SHOULDER ARTHROSCOPY WITH OPEN ROTATOR CUFF REPAIR Right 04/08/2016   Procedure: SHOULDER ARTHROSCOPY WITH OPEN ROTATOR CUFF REPAIR;  Surgeon: Marlynn Singer, MD;  Location: [facility] ORS;   Service: Orthopedics;  Laterality: Right;   TONSILLECTOMY     TRANSFORAMINAL LUMBAR INTERBODY FUSION W/ MIS 1 LEVEL N/A 09/01/2019   Procedure: MINIMALLY INVASIVE (MIS) TRANSFORAMINAL LUMBAR INTERBODY FUSION (TLIF) 1 LEVEL L5-S1;  Surgeon: Jodeen Munch, MD;  Location: [facility] ORS;  Service: Neurosurgery;  Laterality: N/A;   TYMPANOPLASTY Right 08/19/2023   Procedure: TYMPANOPLASTY WITH REMOVAL OF CHOLESTEATOMA;  Surgeon: Von Grumbling, MD;  Location: [facility];  Service: ENT;  Laterality: Right;   VIDEO BRONCHOSCOPY WITH ENDOBRONCHIAL ULTRASOUND  12/25/2023   Procedure: VIDEO BRONCHOSCOPY WITH ENDOBRONCHIAL ULTRASOUND;  Surgeon: Vergia Glasgow, MD;  Location: [facility];  Service: Pulmonary;;   VIDEO BRONCHOSCOPY WITH RADIAL ENDOBRONCHIAL ULTRASOUND  12/25/2023   Procedure: VIDEO BRONCHOSCOPY WITH RADIAL ENDOBRONCHIAL ULTRASOUND;  Surgeon: Vergia Glasgow, MD;  Location: [facility];  Service: Pulmonary;;    SOCIAL HISTORY: Social History   Socioeconomic History   Marital status: Married    Spouse name: Not on file  Number of children: Not on file   Years of education: Not on file   Highest education level: Not on file  Occupational History   Not on file  Tobacco Use   Smoking status: Every Day    Current packs/day: 1.00    Average packs/day: 1 pack/day for 50.5 years (50.5 ttl pk-yrs)    Types: Cigarettes    Start date: 02/08/1968    Last attempt to quit: 02/07/2018   Smokeless tobacco: Never   Tobacco comments:    1PPD khj 01/07/2024  Vaping Use   Vaping status: Former  Substance and Sexual Activity   Alcohol use: Not Currently    Comment: rarely   Drug use: No   Sexual activity: Yes  Other Topics Concern   Not on file  Social History Narrative   Not on file   Social Drivers of Health   Financial Resource Strain: Not on file  Food Insecurity: No Food Insecurity (01/06/2024)   Hunger Vital Sign    Worried About Running Out of Food in the Last Year: Never  true    Ran Out of Food in the Last Year: Never true  Transportation Needs: No Transportation Needs (01/06/2024)   PRAPARE - Administrator, Civil Service (Medical): No    Lack of Transportation (Non-Medical): No  Physical Activity: Not on file  Stress: No Stress Concern Present (01/06/2024)   [employer] of Occupational Health - Occupational Stress Questionnaire    Feeling of Stress : Not at all  Social Connections: Not on file  Intimate Partner Violence: Not At Risk (01/06/2024)   Humiliation, Afraid, Rape, and Kick questionnaire    Fear of Current or Ex-Partner: No    Emotionally Abused: No    Physically Abused: No    Sexually Abused: No    FAMILY HISTORY: History reviewed. No pertinent family history.  ALLERGIES:  is allergic to penicillins and atorvastatin.  MEDICATIONS:  Current Outpatient Medications  Medication Sig Dispense Refill   albuterol  (VENTOLIN  HFA) 108 (90 Base) MCG/ACT inhaler Inhale 1-2 puffs into the lungs every 6 (six) hours as needed for wheezing or shortness of breath.     amitriptyline  (ELAVIL ) 50 MG tablet Take 50 mg by mouth at bedtime.     amLODipine (NORVASC) 2.5 MG tablet Take 1 tablet by mouth at bedtime.     aspirin 325 MG tablet Take 325 mg by mouth daily.     Blood Glucose Monitoring Suppl (ACCU-CHEK GUIDE ME) w/Device KIT      calcium -vitamin D  (OSCAL WITH D) 500-5 MG-MCG tablet Take 2 tablets by mouth daily. 60 tablet 2   ciprofloxacin -dexamethasone  (CIPRODEX ) OTIC suspension Place 4 drops into both ears 2 (two) times daily for 5 days. DOS 08/19/23. 7.5 mL 0   citalopram  (CELEXA ) 40 MG tablet Take 40 mg by mouth every morning.      dexamethasone  (DECADRON ) 4 MG tablet Take 2 tablets daily for 2 days, on days 4 and 5.Take with food. Every 21 days. 30 tablet 1   DULoxetine (CYMBALTA) 60 MG capsule Take 60 mg by mouth 2 (two) times daily.     fluticasone (FLONASE) 50 MCG/ACT nasal spray Place 2 sprays into both nostrils daily.      isosorbide mononitrate (IMDUR) 30 MG 24 hr tablet Take 30 mg by mouth daily.     lansoprazole (PREVACID) 15 MG capsule Take 15 mg by mouth daily at 12 noon.     loratadine (CLARITIN) 10 MG tablet Take 10 mg  by mouth daily.     magic mouthwash (nystatin , lidocaine , diphenhydrAMINE, alum & mag hydroxide) suspension Take 5 mLs by mouth 3 (three) times daily as needed for mouth pain. 300 mL 1   meloxicam  (MOBIC ) 15 MG tablet Take 15 mg by mouth every morning.     metFORMIN (GLUCOPHAGE-XR) 500 MG 24 hr tablet Take 500 mg by mouth 2 (two) times daily.     nitroGLYCERIN  (NITROSTAT ) 0.4 MG SL tablet Place 0.4 mg under the tongue every 5 (five) minutes as needed for chest pain.     ofloxacin  (OCUFLOX ) 0.3 % ophthalmic solution Place 4 drops into the left ear 2 (two) times daily. 5 mL 5   omeprazole  (PRILOSEC ) 20 MG capsule Take 20 mg by mouth daily.     ondansetron  (ZOFRAN ) 8 MG tablet Take 1 tablet (8 mg total) by mouth every 8 (eight) hours as needed for nausea or vomiting. Start on third day after chemotherapy. 30 tablet 1   pregabalin (LYRICA) 300 MG capsule Take 300 mg by mouth 2 (two) times daily.     prochlorperazine  (COMPAZINE ) 10 MG tablet Take 1 tablet (10 mg total) by mouth every 6 (six) hours as needed for nausea or vomiting (Nausea or vomiting). 30 tablet 1   rosuvastatin (CRESTOR) 20 MG tablet Take 20 mg by mouth daily.     spironolactone (ALDACTONE) 25 MG tablet Take 12.5 mg by mouth daily.     tamsulosin (FLOMAX) 0.4 MG CAPS capsule Take 0.4 mg by mouth daily after supper.     umeclidinium-vilanterol (ANORO ELLIPTA ) 62.5-25 MCG/ACT AEPB Inhale 1 puff into the lungs daily. 30 each 11   valsartan (DIOVAN) 320 MG tablet Take 320 mg by mouth daily.     doxycycline (ADOXA) 100 MG tablet Take 100 mg by mouth 2 (two) times daily. (Patient not taking: Reported on 05/11/2024)     No current facility-administered medications for this visit.   Facility-Administered Medications Ordered in Other Visits   Medication Dose Route Frequency Provider Last Rate Last Admin   0.9 %  sodium chloride  infusion   Intravenous Continuous [name], MD 10 mL/hr at 05/11/24 0915 New Bag at 05/11/24 0915   CARBOplatin  (PARAPLATIN ) 510 mg in sodium chloride  0.9 % 250 mL chemo infusion  510 mg Intravenous Once Onnika Siebel, MD       etoposide  (VEPESID ) 200 mg in sodium chloride  0.9 % 500 mL chemo infusion  100 mg/m2 (Treatment Plan Recorded) Intravenous Once [name], MD        Review of Systems  Constitutional:  Negative for appetite change, chills, fatigue, fever and unexpected weight change.  HENT:   Negative for hearing loss and voice change.   Eyes:  Negative for eye problems and icterus.  Respiratory:  Negative for chest tightness, cough and shortness of breath.   Cardiovascular:  Negative for chest pain and leg swelling.  Gastrointestinal:  Negative for abdominal distention and abdominal pain.  Endocrine: Negative for hot flashes.  Genitourinary:  Negative for difficulty urinating, dysuria and frequency.   Musculoskeletal:  Positive for arthralgias.       Right shoulder pain with limited motion  Skin:  Negative for itching and rash.  Neurological:  Negative for light-headedness and numbness.  Hematological:  Negative for adenopathy. Does not bruise/bleed easily.  Psychiatric/Behavioral:  Negative for confusion.      PHYSICAL EXAMINATION: ECOG PERFORMANCE STATUS: 0 - Asymptomatic  Vitals:   05/11/24 0831  BP: 122/71  Pulse: 69  Resp: 16  Temp: (!) 97.1 F (36.2 C)  SpO2: 98%   Filed Weights   05/11/24 0831  Weight: 182 lb (82.6 kg)    Physical Exam Constitutional:      General: He is not in acute distress.    Appearance: He is not diaphoretic.  HENT:     Head: Normocephalic and atraumatic.   Eyes:     General: No scleral icterus.   Cardiovascular:     Rate and Rhythm: Normal rate.  Pulmonary:     Effort: Pulmonary effort is normal. No respiratory distress.  Abdominal:      Tenderness: There is no abdominal tenderness.   Musculoskeletal:     Cervical back: Normal range of motion and neck supple.     Comments: Right shoulder limited range of motion.   Skin:    Findings: No erythema or rash.   Neurological:     Mental Status: He is alert and oriented to person, place, and time. Mental status is at baseline.     Motor: No abnormal muscle tone.   Psychiatric:        Mood and Affect: Mood and affect normal.      LABORATORY DATA:  I have reviewed the data as listed    Latest Ref Rng & Units 05/11/2024    8:06 AM 04/20/2024    9:03 AM 03/30/2024    8:16 AM  CBC  WBC 4.0 - 10.5 K/uL 10.4  11.5  11.0   Hemoglobin 13.0 - 17.0 g/dL 40.9  81.1  91.4   Hematocrit 39.0 - 52.0 % 32.6  34.2  40.3   Platelets 150 - 400 K/uL 144  145  182       Latest Ref Rng & Units 05/11/2024    8:06 AM 04/20/2024    9:03 AM 03/30/2024    8:16 AM  CMP  Glucose 70 - 99 mg/dL 782  956  213   BUN 8 - 23 mg/dL 18  18  15    Creatinine 0.61 - 1.24 mg/dL 0.86  5.78  4.69   Sodium 135 - 145 mmol/L 136  135  136   Potassium 3.5 - 5.1 mmol/L 4.5  3.9  4.1   Chloride 98 - 111 mmol/L 103  102  101   CO2 22 - 32 mmol/L 23  23  24    Calcium  8.9 - 10.3 mg/dL 9.3  8.2  8.9   Total Protein 6.5 - 8.1 g/dL 6.6  6.6  7.4   Total Bilirubin 0.0 - 1.2 mg/dL 0.6  0.6  0.7   Alkaline Phos 38 - 126 U/L 120  135  181   AST 15 - 41 U/L 30  38  31   ALT 0 - 44 U/L 34  49  46      RADIOGRAPHIC STUDIES: I have personally reviewed the radiological images as listed and agreed with the findings in the report. No results found.

## 2024-05-11 NOTE — Patient Instructions (Signed)
 [facility] - A DEPT OF [facility]  Discharge Instructions: Thank you for choosing [facility] to provide your oncology and hematology care.  If you have a lab appointment with the Cancer Center, please go directly to the Cancer Center and check in at the registration area.  Wear comfortable clothing and clothing appropriate for easy access to any Portacath or PICC line.   We strive to give you quality time with your provider. You may need to reschedule your appointment if you arrive late (15 or more minutes).  Arriving late affects you and other patients whose appointments are after yours.  Also, if you miss three or more appointments without notifying the office, you may be dismissed from the clinic at the provider's discretion.      For prescription refill requests, have your pharmacy contact our office and allow 72 hours for refills to be completed.    Today you received the following chemotherapy and/or immunotherapy agents- etoposide , carboplatin       To help prevent nausea and vomiting after your treatment, we encourage you to take your nausea medication as directed.  BELOW ARE SYMPTOMS THAT SHOULD BE REPORTED IMMEDIATELY: *FEVER GREATER THAN 100.4 F (38 C) OR HIGHER *CHILLS OR SWEATING *NAUSEA AND VOMITING THAT IS NOT CONTROLLED WITH YOUR NAUSEA MEDICATION *UNUSUAL SHORTNESS OF BREATH *UNUSUAL BRUISING OR BLEEDING *URINARY PROBLEMS (pain or burning when urinating, or frequent urination) *BOWEL PROBLEMS (unusual diarrhea, constipation, pain near the anus) TENDERNESS IN MOUTH AND THROAT WITH OR WITHOUT PRESENCE OF ULCERS (sore throat, sores in mouth, or a toothache) UNUSUAL RASH, SWELLING OR PAIN  UNUSUAL VAGINAL DISCHARGE OR ITCHING   Items with * indicate a potential emergency and should be followed up as soon as possible or go to the Emergency Department if any problems should occur.  Please show the CHEMOTHERAPY ALERT CARD or  IMMUNOTHERAPY ALERT CARD at check-in to the Emergency Department and triage nurse.  Should you have questions after your visit or need to cancel or reschedule your appointment, please contact [facility] - A DEPT OF [facility]  [phone] and follow the prompts.  Office hours are 8:00 a.m. to 4:30 p.m. Monday - Friday. Please note that voicemails left after 4:00 p.m. may not be returned until the following business day.  We are closed weekends and major holidays. You have access to a nurse at all times for urgent questions. Please call the main number to the clinic [phone] and follow the prompts.  For any non-urgent questions, you may also contact your provider using MyChart. We now offer e-Visits for anyone 72 and older to request care online for non-urgent symptoms. For details visit [email or web address].   Also download the MyChart app! Go to the app store, search "MyChart", open the app, select [place], and log in with your MyChart username and password.

## 2024-05-11 NOTE — Assessment & Plan Note (Addendum)
 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 Not a surgical candidate.  S/p SBRT.  Recommend adjuvant carboplatin  and Etoposide  for 4 cycles. .  Labs are reviewed and discussed with patient. He tolerates treatment well.  Proceed with Cytcle 4 D1carboplatin and D1-3 Etoposide  with GCSF on D5. Recommend claritin 10mg  daily x 4 days.  CT chest w contrast is scheduled on 7/28.  He will follow up with me afterwards  Previously found 3mm nodular enhancement at the left basal ganglia has regression, likely small vessel infarct, less likely due to mets.  He is on Aspirin 325mg  daily and Statin.

## 2024-05-11 NOTE — Assessment & Plan Note (Signed)
 conitnue calcium  1000-1200mg  daily with Vitamin D  supplementation.

## 2024-05-11 NOTE — Assessment & Plan Note (Signed)
Monitor levels 

## 2024-05-11 NOTE — Assessment & Plan Note (Signed)
 Glucose level in 300s. Partially due to steroid use, as well as non compliant with diabetic diet. We discussed about avoiding high sugar content food and beverages.  He is DM treatment - metformin 500mg  BID.

## 2024-05-11 NOTE — Assessment & Plan Note (Signed)
 Chemotherapy plan as listed above

## 2024-05-12 ENCOUNTER — Inpatient Hospital Stay

## 2024-05-12 ENCOUNTER — Other Ambulatory Visit: Payer: Self-pay

## 2024-05-12 ENCOUNTER — Ambulatory Visit
Admission: RE | Admit: 2024-05-12 | Discharge: 2024-05-12 | Disposition: A | Discharge status: Home / Self Care | Source: Ambulatory Visit | Attending: Oncology | Admitting: Oncology

## 2024-05-12 VITALS — BP 127/70 | HR 67 | Temp 97.0°F | Resp 18

## 2024-05-12 DIAGNOSIS — K7689 Other specified diseases of liver: Secondary | ICD-10-CM | POA: Diagnosis not present

## 2024-05-12 DIAGNOSIS — Z5111 Encounter for antineoplastic chemotherapy: Secondary | ICD-10-CM | POA: Diagnosis not present

## 2024-05-12 DIAGNOSIS — R160 Hepatomegaly, not elsewhere classified: Secondary | ICD-10-CM | POA: Diagnosis not present

## 2024-05-12 DIAGNOSIS — E1165 Type 2 diabetes mellitus with hyperglycemia: Secondary | ICD-10-CM | POA: Diagnosis not present

## 2024-05-12 DIAGNOSIS — C7A1 Malignant poorly differentiated neuroendocrine tumors: Secondary | ICD-10-CM

## 2024-05-12 DIAGNOSIS — R042 Hemoptysis: Secondary | ICD-10-CM

## 2024-05-12 DIAGNOSIS — R059 Cough, unspecified: Secondary | ICD-10-CM

## 2024-05-12 DIAGNOSIS — Z5189 Encounter for other specified aftercare: Secondary | ICD-10-CM | POA: Diagnosis not present

## 2024-05-12 DIAGNOSIS — D6481 Anemia due to antineoplastic chemotherapy: Secondary | ICD-10-CM | POA: Diagnosis not present

## 2024-05-12 DIAGNOSIS — Z7982 Long term (current) use of aspirin: Secondary | ICD-10-CM | POA: Diagnosis not present

## 2024-05-12 DIAGNOSIS — C3490 Malignant neoplasm of unspecified part of unspecified bronchus or lung: Secondary | ICD-10-CM | POA: Diagnosis not present

## 2024-05-12 DIAGNOSIS — Z923 Personal history of irradiation: Secondary | ICD-10-CM | POA: Diagnosis not present

## 2024-05-12 DIAGNOSIS — F17210 Nicotine dependence, cigarettes, uncomplicated: Secondary | ICD-10-CM | POA: Diagnosis not present

## 2024-05-12 MED ORDER — SODIUM CHLORIDE 0.9 % IV SOLN
100.0000 mg/m2 | Freq: Once | INTRAVENOUS | Status: AC
  Administered 2024-05-12: 200 mg via INTRAVENOUS
  Filled 2024-05-12: qty 10

## 2024-05-12 MED ORDER — SODIUM CHLORIDE 0.9 % IV SOLN
INTRAVENOUS | Status: DC
  Filled 2024-05-12: qty 250

## 2024-05-12 MED ORDER — IOHEXOL 350 MG/ML SOLN
75.0000 mL | Freq: Once | INTRAVENOUS | Status: AC | PRN
  Administered 2024-05-12: 75 mL via INTRAVENOUS

## 2024-05-12 MED ORDER — DEXAMETHASONE SODIUM PHOSPHATE 10 MG/ML IJ SOLN
10.0000 mg | Freq: Once | INTRAMUSCULAR | Status: AC
  Administered 2024-05-12: 10 mg via INTRAVENOUS
  Filled 2024-05-12: qty 1

## 2024-05-12 NOTE — Patient Instructions (Signed)
 [facility] - A DEPT OF [facility]  Discharge Instructions: Thank you for choosing [facility] to provide your oncology and hematology care.  If you have a lab appointment with the Cancer Center, please go directly to the Cancer Center and check in at the registration area.  Wear comfortable clothing and clothing appropriate for easy access to any Portacath or PICC line.   We strive to give you quality time with your provider. You may need to reschedule your appointment if you arrive late (15 or more minutes).  Arriving late affects you and other patients whose appointments are after yours.  Also, if you miss three or more appointments without notifying the office, you may be dismissed from the clinic at the provider's discretion.      For prescription refill requests, have your pharmacy contact our office and allow 72 hours for refills to be completed.    Today you received the following chemotherapy and/or immunotherapy agents- etoposide , carboplatin       To help prevent nausea and vomiting after your treatment, we encourage you to take your nausea medication as directed.  BELOW ARE SYMPTOMS THAT SHOULD BE REPORTED IMMEDIATELY: *FEVER GREATER THAN 100.4 F (38 C) OR HIGHER *CHILLS OR SWEATING *NAUSEA AND VOMITING THAT IS NOT CONTROLLED WITH YOUR NAUSEA MEDICATION *UNUSUAL SHORTNESS OF BREATH *UNUSUAL BRUISING OR BLEEDING *URINARY PROBLEMS (pain or burning when urinating, or frequent urination) *BOWEL PROBLEMS (unusual diarrhea, constipation, pain near the anus) TENDERNESS IN MOUTH AND THROAT WITH OR WITHOUT PRESENCE OF ULCERS (sore throat, sores in mouth, or a toothache) UNUSUAL RASH, SWELLING OR PAIN  UNUSUAL VAGINAL DISCHARGE OR ITCHING   Items with * indicate a potential emergency and should be followed up as soon as possible or go to the Emergency Department if any problems should occur.  Please show the CHEMOTHERAPY ALERT CARD or  IMMUNOTHERAPY ALERT CARD at check-in to the Emergency Department and triage nurse.  Should you have questions after your visit or need to cancel or reschedule your appointment, please contact [facility] - A DEPT OF [facility]  [phone] and follow the prompts.  Office hours are 8:00 a.m. to 4:30 p.m. Monday - Friday. Please note that voicemails left after 4:00 p.m. may not be returned until the following business day.  We are closed weekends and major holidays. You have access to a nurse at all times for urgent questions. Please call the main number to the clinic [phone] and follow the prompts.  For any non-urgent questions, you may also contact your provider using MyChart. We now offer e-Visits for anyone 72 and older to request care online for non-urgent symptoms. For details visit [email or web address].   Also download the MyChart app! Go to the app store, search "MyChart", open the app, select [place], and log in with your MyChart username and password.

## 2024-05-13 ENCOUNTER — Inpatient Hospital Stay

## 2024-05-13 VITALS — BP 121/64 | HR 79 | Temp 98.5°F | Resp 18

## 2024-05-13 DIAGNOSIS — Z7982 Long term (current) use of aspirin: Secondary | ICD-10-CM | POA: Diagnosis not present

## 2024-05-13 DIAGNOSIS — F17210 Nicotine dependence, cigarettes, uncomplicated: Secondary | ICD-10-CM | POA: Diagnosis not present

## 2024-05-13 DIAGNOSIS — D6481 Anemia due to antineoplastic chemotherapy: Secondary | ICD-10-CM | POA: Diagnosis not present

## 2024-05-13 DIAGNOSIS — C7A1 Malignant poorly differentiated neuroendocrine tumors: Secondary | ICD-10-CM | POA: Diagnosis not present

## 2024-05-13 DIAGNOSIS — Z5189 Encounter for other specified aftercare: Secondary | ICD-10-CM | POA: Diagnosis not present

## 2024-05-13 DIAGNOSIS — E1165 Type 2 diabetes mellitus with hyperglycemia: Secondary | ICD-10-CM | POA: Diagnosis not present

## 2024-05-13 DIAGNOSIS — Z5111 Encounter for antineoplastic chemotherapy: Secondary | ICD-10-CM | POA: Diagnosis not present

## 2024-05-13 DIAGNOSIS — Z923 Personal history of irradiation: Secondary | ICD-10-CM | POA: Diagnosis not present

## 2024-05-13 MED ORDER — SODIUM CHLORIDE 0.9 % IV SOLN
INTRAVENOUS | Status: DC
  Filled 2024-05-13: qty 250

## 2024-05-13 MED ORDER — DEXAMETHASONE SODIUM PHOSPHATE 10 MG/ML IJ SOLN
10.0000 mg | Freq: Once | INTRAMUSCULAR | Status: AC
  Administered 2024-05-13: 10 mg via INTRAVENOUS
  Filled 2024-05-13: qty 1

## 2024-05-13 MED ORDER — SODIUM CHLORIDE 0.9 % IV SOLN
100.0000 mg/m2 | Freq: Once | INTRAVENOUS | Status: AC
  Administered 2024-05-13: 200 mg via INTRAVENOUS
  Filled 2024-05-13: qty 10

## 2024-05-13 NOTE — Progress Notes (Signed)
 Pt presents for treatment today c/o increased SOB, increased cough, feeling of anxiousness, and grayish sputum that looked like an oyster.  This began yesterday after treatment.  Dr Wilhelmenia Harada notified.  Pt sent to cxr following treatment.  VSS.  No other complaints.

## 2024-05-14 ENCOUNTER — Inpatient Hospital Stay

## 2024-05-14 DIAGNOSIS — Z923 Personal history of irradiation: Secondary | ICD-10-CM | POA: Diagnosis not present

## 2024-05-14 DIAGNOSIS — Z5111 Encounter for antineoplastic chemotherapy: Secondary | ICD-10-CM | POA: Diagnosis not present

## 2024-05-14 DIAGNOSIS — E1165 Type 2 diabetes mellitus with hyperglycemia: Secondary | ICD-10-CM | POA: Diagnosis not present

## 2024-05-14 DIAGNOSIS — D6481 Anemia due to antineoplastic chemotherapy: Secondary | ICD-10-CM | POA: Diagnosis not present

## 2024-05-14 DIAGNOSIS — C7A1 Malignant poorly differentiated neuroendocrine tumors: Secondary | ICD-10-CM

## 2024-05-14 DIAGNOSIS — F17210 Nicotine dependence, cigarettes, uncomplicated: Secondary | ICD-10-CM | POA: Diagnosis not present

## 2024-05-14 DIAGNOSIS — Z7982 Long term (current) use of aspirin: Secondary | ICD-10-CM | POA: Diagnosis not present

## 2024-05-14 DIAGNOSIS — Z5189 Encounter for other specified aftercare: Secondary | ICD-10-CM | POA: Diagnosis not present

## 2024-05-14 MED ORDER — PEGFILGRASTIM-JMDB 6 MG/0.6ML ~~LOC~~ SOSY
6.0000 mg | PREFILLED_SYRINGE | Freq: Once | SUBCUTANEOUS | Status: AC
  Administered 2024-05-14: 6 mg via SUBCUTANEOUS
  Filled 2024-05-14: qty 0.6

## 2024-05-31 ENCOUNTER — Other Ambulatory Visit: Payer: Self-pay

## 2024-05-31 ENCOUNTER — Other Ambulatory Visit: Payer: Self-pay | Admitting: *Deleted

## 2024-05-31 DIAGNOSIS — E1169 Type 2 diabetes mellitus with other specified complication: Secondary | ICD-10-CM | POA: Diagnosis not present

## 2024-05-31 DIAGNOSIS — F324 Major depressive disorder, single episode, in partial remission: Secondary | ICD-10-CM | POA: Diagnosis not present

## 2024-05-31 DIAGNOSIS — E7800 Pure hypercholesterolemia, unspecified: Secondary | ICD-10-CM | POA: Diagnosis not present

## 2024-05-31 DIAGNOSIS — K7460 Unspecified cirrhosis of liver: Secondary | ICD-10-CM | POA: Diagnosis not present

## 2024-05-31 DIAGNOSIS — G8929 Other chronic pain: Secondary | ICD-10-CM | POA: Diagnosis not present

## 2024-05-31 DIAGNOSIS — J309 Allergic rhinitis, unspecified: Secondary | ICD-10-CM | POA: Diagnosis not present

## 2024-05-31 DIAGNOSIS — C7A1 Malignant poorly differentiated neuroendocrine tumors: Secondary | ICD-10-CM | POA: Diagnosis not present

## 2024-05-31 DIAGNOSIS — I10 Essential (primary) hypertension: Secondary | ICD-10-CM | POA: Diagnosis not present

## 2024-05-31 DIAGNOSIS — J4489 Other specified chronic obstructive pulmonary disease: Secondary | ICD-10-CM | POA: Diagnosis not present

## 2024-05-31 DIAGNOSIS — I2510 Atherosclerotic heart disease of native coronary artery without angina pectoris: Secondary | ICD-10-CM | POA: Diagnosis not present

## 2024-05-31 DIAGNOSIS — G629 Polyneuropathy, unspecified: Secondary | ICD-10-CM | POA: Diagnosis not present

## 2024-05-31 DIAGNOSIS — M542 Cervicalgia: Secondary | ICD-10-CM | POA: Diagnosis not present

## 2024-05-31 MED ORDER — LIDOCAINE VISCOUS HCL 2 % MT SOLN
5.0000 mL | Freq: Three times a day (TID) | OROMUCOSAL | 1 refills | Status: DC | PRN
  Filled 2024-05-31: qty 300, 20d supply, fill #0

## 2024-06-14 ENCOUNTER — Other Ambulatory Visit: Payer: Self-pay | Admitting: *Deleted

## 2024-06-14 MED ORDER — LIDOCAINE VISCOUS HCL 2 % MT SOLN: 5.0000 mL | Freq: Three times a day (TID) | OROMUCOSAL | 1 refills | Status: DC | PRN

## 2024-06-15 ENCOUNTER — Other Ambulatory Visit: Payer: Self-pay

## 2024-06-15 MED ORDER — NYSTATIN 100000 UNIT/ML MT SUSP
5.0000 mL | Freq: Three times a day (TID) | OROMUCOSAL | 1 refills | Status: DC | PRN
  Filled 2024-06-15: qty 300, 20d supply, fill #0

## 2024-06-15 NOTE — Addendum Note (Signed)
 Addended by: VERDENE GILLS on: 06/15/2024 10:55 AM   Modules accepted: Orders

## 2024-06-21 ENCOUNTER — Ambulatory Visit
Admission: RE | Admit: 2024-06-21 | Discharge: 2024-06-21 | Disposition: A | Discharge status: Home / Self Care | Source: Ambulatory Visit | Attending: Radiation Oncology | Admitting: Radiation Oncology

## 2024-06-21 DIAGNOSIS — C3490 Malignant neoplasm of unspecified part of unspecified bronchus or lung: Secondary | ICD-10-CM | POA: Diagnosis not present

## 2024-06-21 DIAGNOSIS — C3412 Malignant neoplasm of upper lobe, left bronchus or lung: Secondary | ICD-10-CM | POA: Insufficient documentation

## 2024-06-21 DIAGNOSIS — J439 Emphysema, unspecified: Secondary | ICD-10-CM | POA: Diagnosis not present

## 2024-06-21 MED ORDER — IOHEXOL 300 MG/ML  SOLN
75.0000 mL | Freq: Once | INTRAMUSCULAR | Status: AC | PRN
  Administered 2024-06-21: 75 mL via INTRAVENOUS

## 2024-06-21 MED ORDER — IOHEXOL 300 MG/ML  SOLN: 75.0000 mL | Freq: Once | INTRAMUSCULAR | Status: DC | PRN

## 2024-06-25 ENCOUNTER — Other Ambulatory Visit: Payer: Self-pay

## 2024-06-30 ENCOUNTER — Inpatient Hospital Stay: Attending: Oncology

## 2024-06-30 ENCOUNTER — Inpatient Hospital Stay (HOSPITAL_BASED_OUTPATIENT_CLINIC_OR_DEPARTMENT_OTHER): Admitting: Oncology

## 2024-06-30 ENCOUNTER — Encounter: Payer: Self-pay | Admitting: Oncology

## 2024-06-30 VITALS — BP 130/78 | HR 85 | Temp 97.6°F | Resp 18 | Ht 71.0 in | Wt 177.7 lb

## 2024-06-30 DIAGNOSIS — D7589 Other specified diseases of blood and blood-forming organs: Secondary | ICD-10-CM | POA: Diagnosis not present

## 2024-06-30 DIAGNOSIS — Z923 Personal history of irradiation: Secondary | ICD-10-CM | POA: Diagnosis not present

## 2024-06-30 DIAGNOSIS — C7A1 Malignant poorly differentiated neuroendocrine tumors: Secondary | ICD-10-CM | POA: Insufficient documentation

## 2024-06-30 DIAGNOSIS — Z9221 Personal history of antineoplastic chemotherapy: Secondary | ICD-10-CM | POA: Diagnosis not present

## 2024-06-30 DIAGNOSIS — F17210 Nicotine dependence, cigarettes, uncomplicated: Secondary | ICD-10-CM | POA: Diagnosis not present

## 2024-06-30 DIAGNOSIS — Z720 Tobacco use: Secondary | ICD-10-CM | POA: Diagnosis not present

## 2024-06-30 LAB — CBC WITH DIFFERENTIAL (CANCER CENTER ONLY)
Abs Immature Granulocytes: 0.03 K/uL (ref 0.00–0.07)
Basophils Absolute: 0.1 K/uL (ref 0.0–0.1)
Basophils Relative: 1 %
Eosinophils Absolute: 0.2 K/uL (ref 0.0–0.5)
Eosinophils Relative: 3 %
HCT: 40.4 % (ref 39.0–52.0)
Hemoglobin: 13.4 g/dL (ref 13.0–17.0)
Immature Granulocytes: 0 %
Lymphocytes Relative: 25 %
Lymphs Abs: 2.1 K/uL (ref 0.7–4.0)
MCH: 35.1 pg — ABNORMAL HIGH (ref 26.0–34.0)
MCHC: 33.2 g/dL (ref 30.0–36.0)
MCV: 105.8 fL — ABNORMAL HIGH (ref 80.0–100.0)
Monocytes Absolute: 0.5 K/uL (ref 0.1–1.0)
Monocytes Relative: 6 %
Neutro Abs: 5.6 K/uL (ref 1.7–7.7)
Neutrophils Relative %: 65 %
Platelet Count: 163 K/uL (ref 150–400)
RBC: 3.82 MIL/uL — ABNORMAL LOW (ref 4.22–5.81)
RDW: 12.6 % (ref 11.5–15.5)
WBC Count: 8.5 K/uL (ref 4.0–10.5)
nRBC: 0 % (ref 0.0–0.2)

## 2024-06-30 LAB — CMP (CANCER CENTER ONLY)
ALT: 33 U/L (ref 0–44)
AST: 42 U/L — ABNORMAL HIGH (ref 15–41)
Albumin: 4.1 g/dL (ref 3.5–5.0)
Alkaline Phosphatase: 110 U/L (ref 38–126)
Anion gap: 10 (ref 5–15)
BUN: 13 mg/dL (ref 8–23)
CO2: 23 mmol/L (ref 22–32)
Calcium: 9.4 mg/dL (ref 8.9–10.3)
Chloride: 102 mmol/L (ref 98–111)
Creatinine: 1.18 mg/dL (ref 0.61–1.24)
GFR, Estimated: >60 mL/min (ref >60)
Glucose, Bld: 228 mg/dL — ABNORMAL HIGH (ref 70–99)
Potassium: 3.7 mmol/L (ref 3.5–5.1)
Sodium: 135 mmol/L (ref 135–145)
Total Bilirubin: 0.8 mg/dL (ref 0.0–1.2)
Total Protein: 7.5 g/dL (ref 6.5–8.1)

## 2024-06-30 NOTE — Assessment & Plan Note (Signed)
 Recommend smoke cessation.

## 2024-06-30 NOTE — Progress Notes (Signed)
 Patient had a CT scan on 06/21/2024.

## 2024-06-30 NOTE — Progress Notes (Signed)
 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 Not a surgical candidate.  S/p SBRT. S/p 4 cycles of adjuvant carboplatin  and Etoposide   Labs are reviewed and discussed with patient.  CT imaging results reviewed and discussed with patient.  Short-term follow-up.     Macrocytosis Was normal hemoglobin level.  Check B12 and folate at next visit.  Tobacco use Recommend smoke cessation.   Orders Placed This Encounter  Procedures   CT Chest Wo Contrast    Standing Status:   Future    Expected Date:   09/30/2024    Expiration Date:   06/30/2025    Preferred imaging location?:   [facility]   Follow-up in 3 months   all questions were answered. The patient knows to call the clinic with any problems, questions or concerns.  Zelphia Cap, MD, PhD [name] 06/30/2024    HISTORY OF PRESENTING ILLNESS:  [name] 68 y.o. 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 I have reviewed his chart and materials related to his cancer extensively and collaborated history with the patient. Summary of oncologic history is as follows: Oncology History  High grade neuroendocrine carcinoma of lung (HCC)  10/28/2023 Imaging   PET scan showed 1. Intense metabolic activity associated with a LEFT upper lobe pulmonary nodule consistent primary bronchogenic carcinoma. 2. No evidence of metastatic adenopathy in the mediastinum. No distant metastatic disease. 3. Intense metabolic activity associated with the RIGHT shoulder versus arthroplasty. Recommend correlation for loosening or infection. 4. Cirrhotic morphology of the liver. 5. Post CABG.   11/25/2023 Imaging   CT  lung nodule   1. Enlarging microlobulated lingular nodule, now 15.9 mm in size. Lung-RADS 4B, suspicious. Additional imaging evaluation or consultation with Pulmonology or Thoracic Surgery recommended. These results will be called to the ordering clinician or representative by the Radiologist Assistant, and communication documented in the PACS or [employer]. 2. New 4.4 cm lingular nodule.  Recommend attention on follow-up. 3. Cirrhosis. 4.  Aortic atherosclerosis (ICD10-I70.0). 5.  Emphysema    01/06/2024 Initial Diagnosis   High grade neuroendocrine carcinoma of lung   Patient was evaluated by pulmonology and underwent biopsy via bronchoscopy. A.  LEFT LUNG, UPPER LOBE, NODULE, FINE NEEDLE ASPIRATION  BIOPSY:  - Malignant  - Non-small cell carcinoma (see comment)   B.  LEFT LUNG, UPPER LOBE, NODULE, BRUSHING:  - Malignant  - Non-small cell carcinoma (see comment)   ADDENDUM 1 :  Immunohistochemistry is performed to classify the carcinoma.  The tumor cells are negative with TTF-1 and p40.  Additional  immunohistochemistry will be performed and reported in a subsequent  addendum.   ADDENDUM 2: Additional immunohistochemistry is performed to better  characterize the carcinoma and the malignant cells are positive with  synaptophysin and negative with cytokeratin 5/6, CD56 and chromogranin.  Ki-67 shows a moderately increased proliferation rate.  The presence of  synaptophysin positivity is most consistent with intermediate to  high-grade neuroendocrine carcinoma   -- I discussed with pathologist and confirmed the large cell neuroendocrine carcinoma histology.  D. LYMPH NODE, STATION 7, FINE NEEDLE ASPIRATION:  - Negative for malignancy  - Compatible with a benign lymph node   E. LYMPH NODE, STATION 11L, FINE NEEDLE  ASPIRATION:  - Negative for malignancy  - Compatible with a benign lymph node   F. LUNG, LUL, LAVAGE:   FINAL MICROSCOPIC DIAGNOSIS:  - No malignant  cells identified  - Pulmonary macrophages and benign bronchial cells    01/06/2024 Cancer Staging   Staging form: Lung, AJCC V9 - Clinical: Stage IA2 (cT1b, cN0, cM0) - Signed by Babara Call, MD on 01/06/2024   02/05/2024 - 02/17/2024 Radiation Therapy   SBRT x 5 treatment to lung    03/03/2024 Imaging   MRI brain w wo contrast  Regression of nodular enhancement at the left basal ganglia, likely vessel or recent small vessel infarct. No indication of metastatic disease on today's scan.   03/09/2024 -  Chemotherapy   Patient is on Treatment Plan : LUNG  Carboplatin  (AUC 5) D1 + Etoposide  (100) D1-3 q21d     06/21/2024 Imaging   CT chest with contrast showed  Dominant nodule in the lingula again is similar in size compared to the study of June 2025 and decreased from older examination of January 2025.   Decreasing ground-glass identified in the dependent portions of the upper lobes with some increasing patchy areas in the right lower lobe. There is also some increase in subtle areas of other sub 5 mm nodularity. Simple attention on short follow-up.   Stable prominent mediastinal hilar lymph nodes.   Evidence of chronic liver disease with nodular liver and splenomegaly. Please correlate with history.   Postop chest.   Aortic Atherosclerosis (ICD10-I70.0) and Emphysema (ICD10-J43.9).     Denies unintentional weight loss, hemoptysis headache double vision. He has a history of smoking, currently smoking about a pack of cigarettes per day.  Denies any alcohol use. He experiences ongoing pain and swelling in his right shoulder, where he had a reverse shoulder replacement approximately four to five years ago. The PET scan showed activity in this area, raising concerns about possible infection or chronic inflammation. He has limited range of motion and is unable to lift his arm beyond a certain point. He was seen by cardiology recently was cleared to proceed with surgery.  However due to  decreased pulmonary function, he was felt not a surgical candidate. Patient presents to discuss nonsurgical alternative treatments.  He has met with radiation oncology Dr. Chrystal and has been offered SBRT.  INTERVAL HISTORY [name] is a 68 y.o. 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  Patient was accompanied by family member today. His appetite is good and patient's weight is stable.  Patient continue smokes daily. Appetite is good.  He has lost 5 pounds since June 2025.   no new complaints. Denies nausea, vomiting, diarrhea.    MEDICAL HISTORY:  Past Medical History:  Diagnosis Date   Anemia    Anginal pain (HCC)    Arthritis    Atherosclerotic peripheral vascular disease with intermittent claudication (HCC)    Bronchitis    h/o   CHF (congestive heart failure) (HCC)    Cirrhosis, non-alcoholic (HCC)    Coronary artery disease    Depression    Diabetes mellitus without complication (HCC)    diet controlled   Difficult intubation    Pt Denies   Dyspnea    with exertion   GERD (gastroesophageal reflux disease)    Heart failure with mid-range ejection fraction (HFmEF) (HCC)    History of MRSA infection 2010  Hx of CABG    Hyperlipidemia    Hypertension    Insomnia    Ischemic cardiomyopathy    Liver cirrhosis (HCC)    Peripheral vascular disease (HCC)    Stomach ulcer    Stomach ulcer    Wears hearing aid in both ears     SURGICAL HISTORY: Past Surgical History:  Procedure Laterality Date   ANTERIOR CERVICAL DECOMP/DISCECTOMY FUSION N/A 01/28/2018   Procedure: ANTERIOR CERVICAL DECOMPRESSION/DISCECTOMY FUSION 2 LEVELS C3-5;  Surgeon: Clois Fret, MD;  Location: [facility] ORS;  Service: Neurosurgery;  Laterality: N/A;   BRONCHIAL BIOPSY  12/25/2023   Procedure: BRONCHIAL BIOPSIES;  Surgeon: Isadora Hose, MD;  Location: [facility];  Service:  Pulmonary;;   BRONCHIAL BRUSHINGS  12/25/2023   Procedure: BRONCHIAL BRUSHINGS;  Surgeon: Isadora Hose, MD;  Location: [facility] END[facility]COPY;  Service: Pulmonary;;   BRONCHIAL NEEDLE ASPIRATION BIOPSY  12/25/2023   Procedure: BRONCHIAL NEEDLE ASPIRATION BIOPSIES;  Surgeon: Isadora Hose, MD;  Location: [facility];  Service: Pulmonary;;   BRONCHIAL WASHINGS  12/25/2023   Procedure: BRONCHIAL WASHINGS;  Surgeon: Isadora Hose, MD;  Location: [facility];  Service: Pulmonary;;   COLON SURGERY  2004   due to diverticulitis.  Partail colectomy with Anastamosis   COLONOSCOPY N/A 03/27/2023   Procedure: COLONOSCOPY;  Surgeon: Unk Corinn Skiff, MD;  Location: [facility] ENDOSCOPY;  Service: Gastroenterology;  Laterality: N/A;   COLONOSCOPY WITH PROPOFOL  N/A 03/26/2023   Procedure: COLONOSCOPY WITH PROPOFOL ;  Surgeon: Unk Corinn Skiff, MD;  Location: [facility] ENDOSCOPY;  Service: Gastroenterology;  Laterality: N/A;   CORONARY ARTERY BYPASS GRAFT     ESOPHAGOGASTRODUODENOSCOPY (EGD) WITH PROPOFOL  N/A 03/26/2023   Procedure: ESOPHAGOGASTRODUODENOSCOPY (EGD) WITH PROPOFOL ;  Surgeon: Unk Corinn Skiff, MD;  Location: [facility] ENDOSCOPY;  Service: Gastroenterology;  Laterality: N/A;   FINE NEEDLE ASPIRATION  12/25/2023   Procedure: FINE NEEDLE ASPIRATION (FNA) LINEAR;  Surgeon: Isadora Hose, MD;  Location: [facility];  Service: Pulmonary;;   HAND TENDON SURGERY Right    right thumb   LUMBAR LAMINECTOMY/DECOMPRESSION MICRODISCECTOMY N/A 09/01/2019   Procedure: LUMBAR LAMINECTOMY/DECOMPRESSION MICRODISCECTOMY 3 LEVELS L2-L5;  Surgeon: Clois Fret, MD;  Location: [facility] ORS;  Service: Neurosurgery;  Laterality: N/A;   neck fusion     SHOULDER ARTHROSCOPY WITH OPEN ROTATOR CUFF REPAIR Right 04/08/2016   Procedure: SHOULDER ARTHROSCOPY WITH OPEN ROTATOR CUFF REPAIR;  Surgeon: Kayla Pinal, MD;  Location: [facility] ORS;  Service: Orthopedics;  Laterality: Right;   TONSILLECTOMY     TRANSFORAMINAL LUMBAR INTERBODY  FUSION W/ MIS 1 LEVEL N/A 09/01/2019   Procedure: MINIMALLY INVASIVE (MIS) TRANSFORAMINAL LUMBAR INTERBODY FUSION (TLIF) 1 LEVEL L5-S1;  Surgeon: Clois Fret, MD;  Location: [facility]S;  Service: Neurosurgery;  Laterality: N/A;   TYMPANOPLASTY Right 08/19/2023   Procedure: TYMPANOPLASTY WITH REMOVAL OF CHOLESTEATOMA;  Surgeon: Blair Mt, MD;  Location: [facility];  Service: ENT;  Laterality: Right;   VIDEO BRONCHOSCOPY WITH ENDOBRONCHIAL ULTRASOUND  12/25/2023   Procedure: VIDEO BRONCHOSCOPY WITH ENDOBRONCHIAL ULTRASOUND;  Surgeon: Isadora Hose, MD;  Location: [facility];  Service: Pulmonary;;   VIDEO BRONCHOSCOPY WITH RADIAL ENDOBRONCHIAL ULTRASOUND  12/25/2023   Procedure: VIDEO BRONCHOSCOPY WITH RADIAL ENDOBRONCHIAL ULTRASOUND;  Surgeon: Isadora Hose, MD;  Location: [facility];  Service: Pulmonary;;    SOCIAL HISTORY: Social History   Socioeconomic History   Marital status: Married    Spouse name: Not on file   Number of children: Not on file   Years of education: Not on file   Highest education level: Not on file  Occupational History   Not on file  Tobacco Use   Smoking status: Every Day    Current packs/day: 1.00    Average packs/day: 1 pack/day for 50.7 years (50.7 ttl pk-yrs)    Types: Cigarettes    Start date: 02/08/1968    Last attempt to quit: 02/07/2018   Smokeless tobacco: Never   Tobacco comments:    1PPD khj 01/07/2024  Vaping Use   Vaping status: Former  Substance and Sexual Activity   Alcohol use: Not Currently    Comment: rarely   Drug use: No   Sexual activity: Yes  Other Topics Concern   Not on file  Social History Narrative   Not on file   Social Drivers of Health   Financial Resource Strain: Not on file  Food Insecurity: No Food Insecurity (01/06/2024)   Hunger Vital Sign    Worried About Running Out of Food in the Last Year: Never true    Ran Out of Food in the Last Year: Never true  Transportation Needs: No Transportation  Needs (01/06/2024)   PRAPARE - Administrator, Civil Service (Medical): No    Lack of Transportation (Non-Medical): No  Physical Activity: Not on file  Stress: No Stress Concern Present (01/06/2024)   [employer] of Occupational Health - Occupational Stress Questionnaire    Feeling of Stress : Not at all  Social Connections: Not on file  Intimate Partner Violence: Not At Risk (01/06/2024)   Humiliation, Afraid, Rape, and Kick questionnaire    Fear of Current or Ex-Partner: No    Emotionally Abused: No    Physically Abused: No    Sexually Abused: No    FAMILY HISTORY: History reviewed. No pertinent family history.  ALLERGIES:  is allergic to penicillins and atorvastatin.  MEDICATIONS:  Current Outpatient Medications  Medication Sig Dispense Refill   ACCU-CHEK GUIDE TEST test strip      Accu-Chek Softclix Lancets lancets SMARTSIG:Topical     albuterol  (VENTOLIN  HFA) 108 (90 Base) MCG/ACT inhaler Inhale 1-2 puffs into the lungs every 6 (six) hours as needed for wheezing or shortness of breath.     amitriptyline  (ELAVIL ) 50 MG tablet Take 50 mg by mouth at bedtime.     amLODipine (NORVASC) 2.5 MG tablet Take 1 tablet by mouth at bedtime.     aspirin 325 MG tablet Take 325 mg by mouth daily.     Blood Glucose Monitoring Suppl (ACCU-CHEK GUIDE ME) w/Device KIT      calcium -vitamin D  (OSCAL WITH D) 500-5 MG-MCG tablet Take 2 tablets by mouth daily. 60 tablet 2   ciprofloxacin -dexamethasone  (CIPRODEX ) OTIC suspension Place 4 drops into both ears 2 (two) times daily for 5 days. DOS 08/19/23. 7.5 mL 0   citalopram  (CELEXA ) 40 MG tablet Take 40 mg by mouth every morning.      dexamethasone  (DECADRON ) 4 MG tablet Take 2 tablets daily for 2 days, on days 4 and 5.Take with food. Every 21 days. 30 tablet 1   DULoxetine (CYMBALTA) 60 MG capsule Take 60 mg by mouth 2 (two) times daily.     FARXIGA 5 MG TABS tablet Take 5 mg by mouth daily.     fluticasone (FLONASE) 50 MCG/ACT nasal  spray Place 2 sprays into both nostrils daily.     isosorbide mononitrate (IMDUR) 30 MG 24 hr tablet Take 30 mg by mouth daily.     lansoprazole (PREVACID) 15 MG capsule Take 15 mg by mouth daily at 12 noon.  loratadine (CLARITIN) 10 MG tablet Take 10 mg by mouth daily.     magic mouthwash (nystatin , lidocaine , diphenhydrAMINE, alum & mag hydroxide) suspension Take 5 mLs by mouth 3 (three) times daily as needed for mouth pain. 300 mL 1   meloxicam  (MOBIC ) 15 MG tablet Take 15 mg by mouth every morning.     metFORMIN (GLUCOPHAGE-XR) 500 MG 24 hr tablet Take 500 mg by mouth 2 (two) times daily.     nitroGLYCERIN  (NITROSTAT ) 0.4 MG SL tablet Place 0.4 mg under the tongue every 5 (five) minutes as needed for chest pain.     ofloxacin  (OCUFLOX ) 0.3 % ophthalmic solution Place 4 drops into the left ear 2 (two) times daily. 5 mL 5   omeprazole  (PRILOSEC ) 20 MG capsule Take 20 mg by mouth daily.     ondansetron  (ZOFRAN ) 8 MG tablet Take 1 tablet (8 mg total) by mouth every 8 (eight) hours as needed for nausea or vomiting. Start on third day after chemotherapy. 30 tablet 1   pregabalin (LYRICA) 300 MG capsule Take 300 mg by mouth 2 (two) times daily.     prochlorperazine  (COMPAZINE ) 10 MG tablet Take 1 tablet (10 mg total) by mouth every 6 (six) hours as needed for nausea or vomiting (Nausea or vomiting). 30 tablet 1   rosuvastatin (CRESTOR) 20 MG tablet Take 20 mg by mouth daily.     spironolactone (ALDACTONE) 25 MG tablet Take 12.5 mg by mouth daily.     tamsulosin (FLOMAX) 0.4 MG CAPS capsule Take 0.4 mg by mouth daily after supper.     umeclidinium-vilanterol (ANORO ELLIPTA ) 62.5-25 MCG/ACT AEPB Inhale 1 puff into the lungs daily. 30 each 11   valsartan (DIOVAN) 320 MG tablet Take 320 mg by mouth daily.     doxycycline (ADOXA) 100 MG tablet Take 100 mg by mouth 2 (two) times daily. (Patient not taking: Reported on 06/30/2024)     No current facility-administered medications for this visit.     Review of Systems  Constitutional:  Negative for appetite change, chills, fatigue and fever.  HENT:   Negative for hearing loss and voice change.   Eyes:  Negative for eye problems and icterus.  Respiratory:  Negative for chest tightness, cough and shortness of breath.   Cardiovascular:  Negative for chest pain and leg swelling.  Gastrointestinal:  Negative for abdominal distention and abdominal pain.  Endocrine: Negative for hot flashes.  Genitourinary:  Negative for difficulty urinating, dysuria and frequency.   Musculoskeletal:  Positive for arthralgias.       Right shoulder pain with limited motion  Skin:  Negative for itching and rash.  Neurological:  Negative for light-headedness and numbness.  Hematological:  Negative for adenopathy. Does not bruise/bleed easily.  Psychiatric/Behavioral:  Negative for confusion.      PHYSICAL EXAMINATION: ECOG PERFORMANCE STATUS: 0 - Asymptomatic  Vitals:   06/30/24 1002  BP: 130/78  Pulse: 85  Resp: 18  Temp: 97.6 F (36.4 C)  SpO2: 97%   Filed Weights   06/30/24 1002  Weight: 177 lb 11.2 oz (80.6 kg)    Physical Exam Constitutional:      General: He is not in acute distress.    Appearance: He is not diaphoretic.  HENT:     Head: Normocephalic and atraumatic.  Eyes:     General: No scleral icterus. Cardiovascular:     Rate and Rhythm: Normal rate.  Pulmonary:     Effort: Pulmonary effort is normal. No respiratory distress.  Abdominal:     Tenderness: There is no abdominal tenderness.  Musculoskeletal:     Cervical back: Normal range of motion and neck supple.     Comments: Right shoulder limited range of motion.  Skin:    Findings: No erythema or rash.  Neurological:     Mental Status: He is alert and oriented to person, place, and time. Mental status is at baseline.     Motor: No abnormal muscle tone.  Psychiatric:        Mood and Affect: Mood and affect normal.      LABORATORY DATA:  I have reviewed the  data as listed    Latest Ref Rng & Units 06/30/2024    9:47 AM 05/11/2024    8:06 AM 04/20/2024    9:03 AM  CBC  WBC 4.0 - 10.5 K/uL 8.5  10.4  11.5   Hemoglobin 13.0 - 17.0 g/dL 86.5  89.0  88.4   Hematocrit 39.0 - 52.0 % 40.4  32.6  34.2   Platelets 150 - 400 K/uL 163  144  145       Latest Ref Rng & Units 06/30/2024    9:47 AM 05/11/2024    8:06 AM 04/20/2024    9:03 AM  CMP  Glucose 70 - 99 mg/dL 771  688  651   BUN 8 - 23 mg/dL 13  18  18    Creatinine 0.61 - 1.24 mg/dL 8.81  9.13  9.04   Sodium 135 - 145 mmol/L 135  136  135   Potassium 3.5 - 5.1 mmol/L 3.7  4.5  3.9   Chloride 98 - 111 mmol/L 102  103  102   CO2 22 - 32 mmol/L 23  23  23    Calcium  8.9 - 10.3 mg/dL 9.4  9.3  8.2   Total Protein 6.5 - 8.1 g/dL 7.5  6.6  6.6   Total Bilirubin 0.0 - 1.2 mg/dL 0.8  0.6  0.6   Alkaline Phos 38 - 126 U/L 110  120  135   AST 15 - 41 U/L 42  30  38   ALT 0 - 44 U/L 33  34  49      RADIOGRAPHIC STUDIES: I have personally reviewed the radiological images as listed and agreed with the findings in the report. CT Chest W Contrast Result Date: 06/21/2024 CLINICAL DATA:  Non-small-cell lung cancer follow-up. * Tracking Code: BO * EXAM: CT CHEST WITH CONTRAST TECHNIQUE: Multidetector CT imaging of the chest was performed during intravenous contrast administration. RADIATION DOSE REDUCTION: This exam was performed according to the departmental dose-optimization program which includes automated exposure control, adjustment of the mA and/or kV according to patient size and/or use of iterative reconstruction technique. CONTRAST:  75mL OMNIPAQUE  IOHEXOL  300 MG/ML  SOLN COMPARISON:  CTA chest 05/12/2024. older exams as well. FINDINGS: Cardiovascular: Sternal wires. Status post median sternotomy. Heart is nonenlarged. No pericardial effusion. Coronary artery calcifications are seen. The thoracic aorta is normal course and caliber with some scattered atherosclerotic calcified plaque. No significant  pericardial effusion. Mediastinum/Nodes: Slightly patulous thoracic esophagus with slight wall thickening, unchanged. Stable thyroid gland. No specific abnormal lymph node enlargement identified in the axillary regions. There is some small hilar nodes. Example on the right measures 16 x 10 mm on series 2, image 68. Node on the prior examination would have measured 16 by 12 mm, not significant changed. Small left hilar nodes are also stable. There are several small less than 1 cm  size mediastinal nodes as well which are stable. Example anterior to the great vessels along the left side measures 14 by 8 mm on series 2, image 39. Unchanged from previous when adjusted for technique. Lungs/Pleura: Diffuse centrilobular emphysematous lung changes are identified. No pneumothorax or effusion. Slight elevation of the right hemidiaphragm. Slight motion. Few scattered areas of ground-glass are identified which appear increased such as right lower lobe series 3, image 87, series 3, image 104. There are areas which are decreased such is dependently in both upper lobes compared to previous. There is some increasing bandlike changes in the middle lobe likely scar or atelectasis. 3 mm left lower lobe nodule on series 3, image 104. additional left lower lobe focus on series 3, image 89 left upper lobe 2 mm series 3, image 78. There is a calcified right-sided lung nodule on series 3, image 70. In the area of original abnormal lung nodule left upper lobe posteriorly is a subtle area of nodularity today on series 3, image 67 measuring 8 by 3 mm. On the prior examination of June 2025 when remeasured in the same fashion is today the area measured 7 by 4 mm. Upper Abdomen: Nodular heterogeneous liver identified with splenomegaly. Please correlate with any history of chronic liver disease. Stones in the nondilated gallbladder. Adrenal glands are preserved. Musculoskeletal: Streak artifact related to the hardware along the lower cervical  spine and right shoulder. Scattered degenerative changes identified. IMPRESSION: Dominant nodule in the lingula again is similar in size compared to the study of June 2025 and decreased from older examination of January 2025. Decreasing ground-glass identified in the dependent portions of the upper lobes with some increasing patchy areas in the right lower lobe. There is also some increase in subtle areas of other sub 5 mm nodularity. Simple attention on short follow-up. Stable prominent mediastinal hilar lymph nodes. Evidence of chronic liver disease with nodular liver and splenomegaly. Please correlate with history. Postop chest. Aortic Atherosclerosis (ICD10-I70.0) and Emphysema (ICD10-J43.9). Electronically Signed   By: Ranell Bring M.D.   On: 06/21/2024 17:48

## 2024-06-30 NOTE — Assessment & Plan Note (Addendum)
 Was normal hemoglobin level.  Check B12 and folate at next visit.

## 2024-06-30 NOTE — Assessment & Plan Note (Addendum)
 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 Not a surgical candidate.  S/p SBRT. S/p 4 cycles of adjuvant carboplatin  and Etoposide   Labs are reviewed and discussed with patient.  CT imaging results reviewed and discussed with patient.  Short-term follow-up.

## 2024-07-01 ENCOUNTER — Other Ambulatory Visit: Payer: Self-pay

## 2024-07-01 ENCOUNTER — Ambulatory Visit
Admission: RE | Admit: 2024-07-01 | Discharge: 2024-07-01 | Disposition: A | Discharge status: Home / Self Care | Source: Ambulatory Visit | Attending: Radiation Oncology | Admitting: Radiation Oncology

## 2024-07-01 ENCOUNTER — Encounter: Payer: Self-pay | Admitting: Radiation Oncology

## 2024-07-01 VITALS — BP 124/72 | HR 99 | Temp 97.6°F | Resp 20 | Wt 186.7 lb

## 2024-07-01 DIAGNOSIS — C3412 Malignant neoplasm of upper lobe, left bronchus or lung: Secondary | ICD-10-CM | POA: Insufficient documentation

## 2024-07-01 DIAGNOSIS — Z923 Personal history of irradiation: Secondary | ICD-10-CM | POA: Diagnosis not present

## 2024-07-01 DIAGNOSIS — F17210 Nicotine dependence, cigarettes, uncomplicated: Secondary | ICD-10-CM | POA: Diagnosis not present

## 2024-07-01 NOTE — Progress Notes (Signed)
 Radiation Oncology Follow up Note  Name: [name]   Date:   07/01/2024 MRN:  [id number] DOB: 1956-05-20    This 68 y.o. male presents to the clinic today for 68-month follow-up status post SBRT for stage I non-small cell lung cancer of the left upper lobe.  REFERRING PROVIDER: Montey Rankin, PA-C  HPI: Patient is a 68 year old male now out 4 months having completed SBRT to his left upper lobe for stage I non-small cell lung cancer.  Seen today in routine follow-up he is doing well.  Specifically denies any change in his pulmonary status mild cough no hemoptysis or chest tightness..  He had a recent CT scan of his chest showing dominant k nodule in his lingula similar in size compared to prior study on all of my review it is much decreased in size from prior examination.  Also as discussed creasing groundglass identifying the dependent portions of the upper lobes measuring sub-5 mm.  Still has some prominent mediastinal hilar lymph nodes although on my review there is stable.  COMPLICATIONS OF TREATMENT: none  FOLLOW UP COMPLIANCE: keeps appointments   PHYSICAL EXAM:  BP 124/72   Pulse 99   Temp 97.6 F (36.4 C)   Resp 20   Wt 186 lb 11.2 oz (84.7 kg)   SpO2 100%   BMI 26.04 kg/m  Well-developed well-nourished patient in NAD. HEENT reveals PERLA, EOMI, discs not visualized.  Oral cavity is clear. No oral mucosal lesions are identified. Neck is clear without evidence of cervical or supraclavicular adenopathy. Lungs are clear to A&P. Cardiac examination is essentially unremarkable with regular rate and rhythm without murmur rub or thrill. Abdomen is benign with no organomegaly or masses noted. Motor sensory and DTR levels are equal and symmetric in the upper and lower extremities. Cranial nerves II through XII are grossly intact. Proprioception is intact. No peripheral adenopathy or edema is identified. No motor or sensory levels are noted. Crude visual fields are within normal  range.  RADIOLOGY RESULTS: CT scan serial are reviewed compatible with above-stated findings  PLAN: Present time patient is doing well and pleased with his overall low side effect profile as well as CT results.  I have asked to see him back in 6 months for follow-up with a repeat CT scan.  Patient knows to call with any concerns at any time.  I would like to take this opportunity to thank you for allowing me to participate in the care of your patient.SABRA Marcey Penton, MD

## 2024-08-10 DIAGNOSIS — G8929 Other chronic pain: Secondary | ICD-10-CM | POA: Diagnosis not present

## 2024-08-10 DIAGNOSIS — F324 Major depressive disorder, single episode, in partial remission: Secondary | ICD-10-CM | POA: Diagnosis not present

## 2024-08-10 DIAGNOSIS — C7A1 Malignant poorly differentiated neuroendocrine tumors: Secondary | ICD-10-CM | POA: Diagnosis not present

## 2024-08-10 DIAGNOSIS — I10 Essential (primary) hypertension: Secondary | ICD-10-CM | POA: Diagnosis not present

## 2024-08-10 DIAGNOSIS — J439 Emphysema, unspecified: Secondary | ICD-10-CM | POA: Diagnosis not present

## 2024-08-10 DIAGNOSIS — E7800 Pure hypercholesterolemia, unspecified: Secondary | ICD-10-CM | POA: Diagnosis not present

## 2024-08-10 DIAGNOSIS — E1169 Type 2 diabetes mellitus with other specified complication: Secondary | ICD-10-CM | POA: Diagnosis not present

## 2024-08-10 DIAGNOSIS — M542 Cervicalgia: Secondary | ICD-10-CM | POA: Diagnosis not present

## 2024-08-10 DIAGNOSIS — J4489 Other specified chronic obstructive pulmonary disease: Secondary | ICD-10-CM | POA: Diagnosis not present

## 2024-08-10 DIAGNOSIS — I2510 Atherosclerotic heart disease of native coronary artery without angina pectoris: Secondary | ICD-10-CM | POA: Diagnosis not present

## 2024-08-10 DIAGNOSIS — G629 Polyneuropathy, unspecified: Secondary | ICD-10-CM | POA: Diagnosis not present

## 2024-08-10 DIAGNOSIS — K7460 Unspecified cirrhosis of liver: Secondary | ICD-10-CM | POA: Diagnosis not present

## 2024-08-16 ENCOUNTER — Telehealth: Payer: Self-pay

## 2024-08-16 NOTE — Telephone Encounter (Signed)
 Please see what a cheaper option to the Anoro would be. Thank you!

## 2024-08-16 NOTE — Telephone Encounter (Signed)
 Copied from CRM [id number]. Topic: Clinical - Prescription Issue >> Aug 16, 2024  4:30 PM Devaughn RAMAN wrote: Reason for CRM: Patient called and stated Dr.Dgayli prescribed him an inhaler and his insurance is not paying for it and he needs a different medication that is cheaper.

## 2024-08-18 ENCOUNTER — Other Ambulatory Visit (HOSPITAL_COMMUNITY): Payer: Self-pay

## 2024-08-18 NOTE — Telephone Encounter (Signed)
 LMTCB. E2C2 please advise when patient calls back.

## 2024-08-19 NOTE — Telephone Encounter (Signed)
 Copied from CRM #[id number]. Topic: Clinical - Prescription Issue >> Aug 18, 2024  5:30 PM Rilla B wrote: Reason for CRM: Patient returning call to office.  Please call patient @ [phone].

## 2024-08-19 NOTE — Telephone Encounter (Signed)
 I have notified the patient. He said he can not afford either of these medications.   Dr. Isadora please advise.

## 2024-08-20 ENCOUNTER — Other Ambulatory Visit (HOSPITAL_COMMUNITY): Payer: Self-pay

## 2024-08-23 MED ORDER — REVEFENACIN 175 MCG/3ML IN SOLN: 175.0000 ug | Freq: Every day | RESPIRATORY_TRACT | 11 refills | Status: DC

## 2024-08-23 MED ORDER — BUDESONIDE-FORMOTEROL FUMARATE 160-4.5 MCG/ACT IN AERO: 2.0000 | INHALATION_SPRAY | Freq: Two times a day (BID) | RESPIRATORY_TRACT | 12 refills | Status: DC

## 2024-08-23 NOTE — Telephone Encounter (Signed)
 Scripts have been sent to [facility].  Nothing further needed.

## 2024-08-23 NOTE — Telephone Encounter (Signed)
 I have notified the patient. He would like the Symbicort and Yupelri sent to [facility] pharmacy. He will go to the [facility] pharmacy to purchase a nebulizer machine.   Dr. Isadora can you let me know what does Symbicort you would like to send in. I will send the meds so I can make sure they bill the Yupelri under his Medicare Part B.  Thank you!

## 2024-09-24 NOTE — Progress Notes (Signed)
 [name]                                          MRN: [id number]   09/24/2024   The [facility] Quality Team Specialist reviewed this patient medical record for the purposes of chart review for care gap closure. The following were reviewed: chart review for care gap closure-glycemic status assessment.    [facility] Quality Team

## 2024-09-30 ENCOUNTER — Ambulatory Visit
Admission: RE | Admit: 2024-09-30 | Discharge: 2024-09-30 | Disposition: A | Discharge status: Home / Self Care | Source: Ambulatory Visit | Attending: Oncology | Admitting: Oncology

## 2024-09-30 DIAGNOSIS — I700 Atherosclerosis of aorta: Secondary | ICD-10-CM | POA: Diagnosis not present

## 2024-09-30 DIAGNOSIS — C7A1 Malignant poorly differentiated neuroendocrine tumors: Secondary | ICD-10-CM | POA: Diagnosis not present

## 2024-09-30 DIAGNOSIS — C3490 Malignant neoplasm of unspecified part of unspecified bronchus or lung: Secondary | ICD-10-CM | POA: Diagnosis not present

## 2024-09-30 DIAGNOSIS — J439 Emphysema, unspecified: Secondary | ICD-10-CM | POA: Diagnosis not present

## 2024-10-01 DIAGNOSIS — Z008 Encounter for other general examination: Secondary | ICD-10-CM | POA: Diagnosis not present

## 2024-10-07 ENCOUNTER — Encounter: Payer: Self-pay | Admitting: Oncology

## 2024-10-07 ENCOUNTER — Encounter: Payer: Self-pay | Admitting: Radiation Oncology

## 2024-10-07 ENCOUNTER — Ambulatory Visit
Admission: RE | Admit: 2024-10-07 | Discharge: 2024-10-07 | Disposition: A | Discharge status: Home / Self Care | Source: Ambulatory Visit | Attending: Radiation Oncology | Admitting: Radiation Oncology

## 2024-10-07 ENCOUNTER — Inpatient Hospital Stay: Attending: Oncology | Admitting: Oncology

## 2024-10-07 VITALS — BP 139/69 | HR 75 | Temp 97.0°F | Resp 18 | Ht 71.0 in | Wt 185.0 lb

## 2024-10-07 DIAGNOSIS — Z85110 Personal history of malignant carcinoid tumor of bronchus and lung: Secondary | ICD-10-CM | POA: Diagnosis present

## 2024-10-07 DIAGNOSIS — Z720 Tobacco use: Secondary | ICD-10-CM | POA: Diagnosis not present

## 2024-10-07 DIAGNOSIS — Z9221 Personal history of antineoplastic chemotherapy: Secondary | ICD-10-CM | POA: Insufficient documentation

## 2024-10-07 DIAGNOSIS — F17210 Nicotine dependence, cigarettes, uncomplicated: Secondary | ICD-10-CM | POA: Insufficient documentation

## 2024-10-07 DIAGNOSIS — Z923 Personal history of irradiation: Secondary | ICD-10-CM | POA: Diagnosis not present

## 2024-10-07 DIAGNOSIS — C3412 Malignant neoplasm of upper lobe, left bronchus or lung: Secondary | ICD-10-CM | POA: Insufficient documentation

## 2024-10-07 DIAGNOSIS — J439 Emphysema, unspecified: Secondary | ICD-10-CM | POA: Insufficient documentation

## 2024-10-07 DIAGNOSIS — C7A1 Malignant poorly differentiated neuroendocrine tumors: Secondary | ICD-10-CM

## 2024-10-07 NOTE — Progress Notes (Signed)
 Patient has no new or acute concerns at this time.

## 2024-10-07 NOTE — Progress Notes (Signed)
 Radiation Oncology Follow up Note  Name: LEVII HAIRFIELD   Date:   10/07/2024 MRN:  [id number] DOB: 02-18-1956    This 68 y.o. male presents to the clinic today for 60-month follow-up status post SBRT for stage I non-small cell lung cancer of the left upper lobe.  REFERRING PROVIDER: Montey Rankin, PA-C  HPI: Patient is a 68 year old male now out 7 months having completed SBRT to the left upper lobe for stage I non-small cell lung cancer.  Seen today in routine follow-up he is doing well.  He continues to smoke does have issues with his significant COPD.  Has a mild nonproductive cough no hemoptysis no dysphagia.  He had a recent CT scan which I have reviewed.  Showing interval development of mild distortion bandlike density within the lingula left upper lobe consistent with radiation changes.  The previous nodule at the site of prior lingula nodule is not identified.  He does have other scattered small pulmonary nodules unchanged over time which we will continue to monitor.  He does have emphysema.  COMPLICATIONS OF TREATMENT: none  FOLLOW UP COMPLIANCE: keeps appointments   PHYSICAL EXAM:  BP 139/69   Pulse 75   Temp (!) 97 F (36.1 C)   Resp 18   Ht 5' 11 (1.803 m)   Wt 185 lb (83.9 kg)   SpO2 98%   BMI 25.80 kg/m  Well-developed well-nourished patient in NAD. HEENT reveals PERLA, EOMI, discs not visualized.  Oral cavity is clear. No oral mucosal lesions are identified. Neck is clear without evidence of cervical or supraclavicular adenopathy. Lungs are clear to A&P. Cardiac examination is essentially unremarkable with regular rate and rhythm without murmur rub or thrill. Abdomen is benign with no organomegaly or masses noted. Motor sensory and DTR levels are equal and symmetric in the upper and lower extremities. Cranial nerves II through XII are grossly intact. Proprioception is intact. No peripheral adenopathy or edema is identified. No motor or sensory levels are noted. Crude  visual fields are within normal range.  RADIOLOGY RESULTS: CT scan reviewed compatible with above-stated findings  PLAN: At the present time patient is doing well excellent response by CT criteria.  And pleased with his overall progress.  Of asked to see him back in 6 months for follow-up with repeat CT scan of his chest.  Patient knows to call with any concerns.  Also emphasized his need to discontinue smoking is of paramount importance.  Patient comprehends my recommendations well.  I would like to take this opportunity to thank you for allowing me to participate in the care of your patient.SABRA Marcey Penton, MD

## 2024-10-07 NOTE — Progress Notes (Addendum)
 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 Not a surgical candidate.  S/p SBRT. S/p 4 cycles of adjuvant carboplatin  and Etoposide   09/30/2024 CT chest wo contrast. No recurrence.  Recommend patient to follow up in 6 months, repeat CT chest.      Tobacco use Recommend smoke cessation.   Orders Placed This Encounter  Procedures   CBC with Differential (Cancer Center Only)    Standing Status:   Future    Expected Date:   04/06/2025    Expiration Date:   07/05/2025   CMP (Cancer Center only)    Standing Status:   Future    Expected Date:   04/06/2025    Expiration Date:   07/05/2025   Follow-up in 6 months   all questions were answered. The patient knows to call the clinic with any problems, questions or concerns.  Zelphia Cap, MD, PhD [facility] Hematology Oncology 10/07/2024    HISTORY OF PRESENTING ILLNESS:  [name] 68 y.o. 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 I have reviewed his chart and materials related to his cancer extensively and collaborated history with the patient. Summary of oncologic history is as follows: Oncology History  High grade neuroendocrine carcinoma of lung (HCC)  10/28/2023 Imaging   PET scan showed 1. Intense metabolic activity associated with a LEFT upper lobe pulmonary nodule consistent primary bronchogenic carcinoma. 2. No evidence of metastatic adenopathy in the mediastinum. No distant metastatic disease. 3. Intense metabolic activity associated with the RIGHT shoulder versus arthroplasty. Recommend correlation for loosening or infection. 4. Cirrhotic morphology of the liver. 5. Post CABG.   11/25/2023 Imaging   CT lung  nodule   1. Enlarging microlobulated lingular nodule, now 15.9 mm in size. Lung-RADS 4B, suspicious. Additional imaging evaluation or consultation with Pulmonology or Thoracic Surgery recommended. These results will be called to the ordering clinician or representative by the Radiologist Assistant, and communication documented in the PACS or [employer]. 2. New 4.4 cm lingular nodule.  Recommend attention on follow-up. 3. Cirrhosis. 4.  Aortic atherosclerosis (ICD10-I70.0). 5.  Emphysema    01/06/2024 Initial Diagnosis   High grade neuroendocrine carcinoma of lung   Patient was evaluated by pulmonology and underwent biopsy via bronchoscopy. A.  LEFT LUNG, UPPER LOBE, NODULE, FINE NEEDLE ASPIRATION  BIOPSY:  - Malignant  - Non-small cell carcinoma (see comment)   B.  LEFT LUNG, UPPER LOBE, NODULE, BRUSHING:  - Malignant  - Non-small cell carcinoma (see comment)   ADDENDUM 1 :  Immunohistochemistry is performed to classify the carcinoma.  The tumor cells are negative with TTF-1 and p40.  Additional  immunohistochemistry will be performed and reported in a subsequent  addendum.   ADDENDUM 2: Additional immunohistochemistry is performed to better  characterize the carcinoma and the malignant cells are positive with  synaptophysin and negative with cytokeratin 5/6, CD56 and chromogranin.  Ki-67 shows a moderately increased proliferation rate.  The presence of  synaptophysin positivity is most consistent with intermediate to  high-grade neuroendocrine carcinoma   -- I discussed with pathologist and confirmed the large cell neuroendocrine carcinoma histology.  D. LYMPH NODE, STATION 7, FINE NEEDLE ASPIRATION:  - Negative for malignancy  - Compatible with a benign lymph node  E. LYMPH NODE, STATION 11L, FINE NEEDLE ASPIRATION:  - Negative for malignancy  - Compatible with a benign lymph node   F. LUNG, LUL, LAVAGE:   FINAL MICROSCOPIC DIAGNOSIS:  - No malignant cells  identified  - Pulmonary macrophages and benign bronchial cells    01/06/2024 Cancer Staging   Staging form: Lung, AJCC V9 - Clinical: Stage IA2 (cT1b, cN0, cM0) - Signed by Babara Call, MD on 01/06/2024   02/05/2024 - 02/17/2024 Radiation Therapy   SBRT x 5 treatment to lung    03/03/2024 Imaging   MRI brain w wo contrast  Regression of nodular enhancement at the left basal ganglia, likely vessel or recent small vessel infarct. No indication of metastatic disease on today's scan.   03/09/2024 -  Chemotherapy   Patient is on Treatment Plan : LUNG  Carboplatin  (AUC 5) D1 + Etoposide  (100) D1-3 q21d     06/21/2024 Imaging   CT chest with contrast showed  Dominant nodule in the lingula again is similar in size compared to the study of June 2025 and decreased from older examination of January 2025.   Decreasing ground-glass identified in the dependent portions of the upper lobes with some increasing patchy areas in the right lower lobe. There is also some increase in subtle areas of other sub 5 mm nodularity. Simple attention on short follow-up.   Stable prominent mediastinal hilar lymph nodes.   Evidence of chronic liver disease with nodular liver and splenomegaly. Please correlate with history.   Postop chest.   Aortic Atherosclerosis (ICD10-I70.0) and Emphysema (ICD10-J43.9).   09/30/2024 Imaging   CT chest wo contrast   1. Interval development of mild distortion and bandlike density within the lingula/left upper lobe, felt consistent with post radiation changes. The previously measured nodular focus at the site of prior lingular nodule is not confidently identified. 2. Other scattered small pulmonary nodules are unchanged. Sub solid nodules most evident in the right lower lobe are also stable. Continued attention on chest CT follow-up. 3. Emphysema. 4. Liver cirrhosis. 5. Aortic atherosclerosis.     Denies unintentional weight loss, hemoptysis headache double vision. He  has a history of smoking, currently smoking about a pack of cigarettes per day.  Denies any alcohol use. He experiences ongoing pain and swelling in his right shoulder, where he had a reverse shoulder replacement approximately four to five years ago. The PET scan showed activity in this area, raising concerns about possible infection or chronic inflammation. He has limited range of motion and is unable to lift his arm beyond a certain point. He was seen by cardiology recently was cleared to proceed with surgery.  However due to decreased pulmonary function, he was felt not a surgical candidate. Patient presents to discuss nonsurgical alternative treatments.  He has met with radiation oncology Dr. Chrystal and has been offered SBRT.  INTERVAL HISTORY [name] is a 68 y.o. 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  Patient was accompanied by daughter today. His appetite is good and patient's weight is stable.  Patient continue smokes daily but has cut back.  no new complaints. Denies nausea, vomiting, diarrhea.    MEDICAL HISTORY:  Past Medical History:  Diagnosis Date   Anemia    Anginal pain    Arthritis    Atherosclerotic peripheral vascular disease with intermittent claudication    Bronchitis    h/o  CHF (congestive heart failure) (HCC)    Cirrhosis, non-alcoholic (HCC)    Coronary artery disease    Depression    Diabetes mellitus without complication (HCC)    diet controlled   Difficult intubation    Pt Denies   Dyspnea    with exertion   GERD (gastroesophageal reflux disease)    Heart failure with mid-range ejection fraction (HFmEF) (HCC)    History of MRSA infection 2010   Hx of CABG    Hyperlipidemia    Hypertension    Insomnia    Ischemic cardiomyopathy    Liver cirrhosis (HCC)    Peripheral vascular disease    Stomach ulcer    Stomach ulcer    Wears  hearing aid in both ears     SURGICAL HISTORY: Past Surgical History:  Procedure Laterality Date   ANTERIOR CERVICAL DECOMP/DISCECTOMY FUSION N/A 01/28/2018   Procedure: ANTERIOR CERVICAL DECOMPRESSION/DISCECTOMY FUSION 2 LEVELS C3-5;  Surgeon: Clois Fret, MD;  Location: [facility] ORS;  Service: Neurosurgery;  Laterality: N/A;   BRONCHIAL BIOPSY  12/25/2023   Procedure: BRONCHIAL BIOPSIES;  Surgeon: Isadora Hose, MD;  Location: [facility];  Service: Pulmonary;;   BRONCHIAL BRUSHINGS  12/25/2023   Procedure: BRONCHIAL BRUSHINGS;  Surgeon: Isadora Hose, MD;  Location: [facility] ENDOSCOPY;  Service: Pulmonary;;   BRONCHIAL NEEDLE ASPIRATION BIOPSY  12/25/2023   Procedure: BRONCHIAL NEEDLE ASPIRATION BIOPSIES;  Surgeon: Isadora Hose, MD;  Location: [facility];  Service: Pulmonary;;   BRONCHIAL WASHINGS  12/25/2023   Procedure: BRONCHIAL WASHINGS;  Surgeon: Isadora Hose, MD;  Location: [facility];  Service: Pulmonary;;   COLON SURGERY  2004   due to diverticulitis.  Partail colectomy with Anastamosis   COLONOSCOPY N/A 03/27/2023   Procedure: COLONOSCOPY;  Surgeon: Unk Corinn Skiff, MD;  Location: [facility];  Service: Gastroenterology;  Laterality: N/A;   COLONOSCOPY WITH PROPOFOL  N/A 03/26/2023   Procedure: COLONOSCOPY WITH PROPOFOL ;  Surgeon: Unk Corinn Skiff, MD;  Location: [facility] ENDOSCOPY;  Service: Gastroenterology;  Laterality: N/A;   CORONARY ARTERY BYPASS GRAFT     ESOPHAGOGASTRODUODENOSCOPY (EGD) WITH PROPOFOL  N/A 03/26/2023   Procedure: ESOPHAGOGASTRODUODENOSCOPY (EGD) WITH PROPOFOL ;  Surgeon: Unk Corinn Skiff, MD;  Location: [facility] ENDOSCOPY;  Service: Gastroenterology;  Laterality: N/A;   FINE NEEDLE ASPIRATION  12/25/2023   Procedure: FINE NEEDLE ASPIRATION (FNA) LINEAR;  Surgeon: Isadora Hose, MD;  Location: [facility];  Service: Pulmonary;;   HAND TENDON SURGERY Right    right thumb   LUMBAR LAMINECTOMY/DECOMPRESSION MICRODISCECTOMY N/A 09/01/2019    Procedure: LUMBAR LAMINECTOMY/DECOMPRESSION MICRODISCECTOMY 3 LEVELS L2-L5;  Surgeon: Clois Fret, MD;  Location: [facility] ORS;  Service: Neurosurgery;  Laterality: N/A;   neck fusion     SHOULDER ARTHROSCOPY WITH OPEN ROTATOR CUFF REPAIR Right 04/08/2016   Procedure: SHOULDER ARTHROSCOPY WITH OPEN ROTATOR CUFF REPAIR;  Surgeon: Kayla Pinal, MD;  Location: [facility] ORS;  Service: Orthopedics;  Laterality: Right;   TONSILLECTOMY     TRANSFORAMINAL LUMBAR INTERBODY FUSION W/ MIS 1 LEVEL N/A 09/01/2019   Procedure: MINIMALLY INVASIVE (MIS) TRANSFORAMINAL LUMBAR INTERBODY FUSION (TLIF) 1 LEVEL L5-S1;  Surgeon: Clois Fret, MD;  Location: [facility] ORS;  Service: Neurosurgery;  Laterality: N/A;   TYMPANOPLASTY Right 08/19/2023   Procedure: TYMPANOPLASTY WITH REMOVAL OF CHOLESTEATOMA;  Surgeon: Blair Mt, MD;  Location: [facility];  Service: ENT;  Laterality: Right;   VIDEO BRONCHOSCOPY WITH ENDOBRONCHIAL ULTRASOUND  12/25/2023   Procedure: VIDEO BRONCHOSCOPY WITH ENDOBRONCHIAL ULTRASOUND;  Surgeon: Isadora Hose, MD;  Location: [facility];  Service: Pulmonary;;   VIDEO  BRONCHOSCOPY WITH RADIAL ENDOBRONCHIAL ULTRASOUND  12/25/2023   Procedure: VIDEO BRONCHOSCOPY WITH RADIAL ENDOBRONCHIAL ULTRASOUND;  Surgeon: Isadora Hose, MD;  Location: [facility];  Service: Pulmonary;;    SOCIAL HISTORY: Social History   Socioeconomic History   Marital status: Married    Spouse name: Not on file   Number of children: Not on file   Years of education: Not on file   Highest education level: Not on file  Occupational History   Not on file  Tobacco Use   Smoking status: Every Day    Current packs/day: 1.00    Average packs/day: 1 pack/day for 51.0 years (51.0 ttl pk-yrs)    Types: Cigarettes    Start date: 02/08/1968    Last attempt to quit: 02/07/2018   Smokeless tobacco: Never   Tobacco comments:    1PPD khj 01/07/2024  Vaping Use   Vaping status: Former  Substance and Sexual  Activity   Alcohol use: Not Currently    Comment: rarely   Drug use: No   Sexual activity: Yes  Other Topics Concern   Not on file  Social History Narrative   Not on file   Social Drivers of Health   Financial Resource Strain: Not on file  Food Insecurity: No Food Insecurity (01/06/2024)   Hunger Vital Sign    Worried About Running Out of Food in the Last Year: Never true    Ran Out of Food in the Last Year: Never true  Transportation Needs: No Transportation Needs (01/06/2024)   PRAPARE - Administrator, Civil Service (Medical): No    Lack of Transportation (Non-Medical): No  Physical Activity: Not on file  Stress: No Stress Concern Present (01/06/2024)   [employer] of Occupational Health - Occupational Stress Questionnaire    Feeling of Stress : Not at all  Social Connections: Not on file  Intimate Partner Violence: Not At Risk (01/06/2024)   Humiliation, Afraid, Rape, and Kick questionnaire    Fear of Current or Ex-Partner: No    Emotionally Abused: No    Physically Abused: No    Sexually Abused: No    FAMILY HISTORY: History reviewed. No pertinent family history.  ALLERGIES:  is allergic to penicillins and atorvastatin.  MEDICATIONS:  Current Outpatient Medications  Medication Sig Dispense Refill   ACCU-CHEK GUIDE TEST test strip      Accu-Chek Softclix Lancets lancets SMARTSIG:Topical     albuterol  (VENTOLIN  HFA) 108 (90 Base) MCG/ACT inhaler Inhale 1-2 puffs into the lungs every 6 (six) hours as needed for wheezing or shortness of breath.     amitriptyline  (ELAVIL ) 50 MG tablet Take 50 mg by mouth at bedtime.     amLODipine (NORVASC) 2.5 MG tablet Take 1 tablet by mouth at bedtime.     aspirin 325 MG tablet Take 325 mg by mouth daily.     Blood Glucose Monitoring Suppl (ACCU-CHEK GUIDE ME) w/Device KIT      budesonide -formoterol (SYMBICORT) 160-4.5 MCG/ACT inhaler Inhale 2 puffs into the lungs in the morning and at bedtime. 1 each 12    calcium -vitamin D  (OSCAL WITH D) 500-5 MG-MCG tablet Take 2 tablets by mouth daily. 60 tablet 2   ciprofloxacin -dexamethasone  (CIPRODEX ) OTIC suspension Place 4 drops into both ears 2 (two) times daily for 5 days. DOS 08/19/23. 7.5 mL 0   citalopram  (CELEXA ) 40 MG tablet Take 40 mg by mouth every morning.      dexamethasone  (DECADRON ) 4 MG tablet Take 2 tablets daily for 2  days, on days 4 and 5.Take with food. Every 21 days. 30 tablet 1   DULoxetine (CYMBALTA) 60 MG capsule Take 60 mg by mouth 2 (two) times daily.     FARXIGA 5 MG TABS tablet Take 5 mg by mouth daily.     fluticasone (FLONASE) 50 MCG/ACT nasal spray Place 2 sprays into both nostrils daily.     isosorbide mononitrate (IMDUR) 30 MG 24 hr tablet Take 30 mg by mouth daily.     lansoprazole (PREVACID) 15 MG capsule Take 15 mg by mouth daily at 12 noon.     loratadine (CLARITIN) 10 MG tablet Take 10 mg by mouth daily.     magic mouthwash (nystatin , lidocaine , diphenhydrAMINE, alum & mag hydroxide) suspension Take 5 mLs by mouth 3 (three) times daily as needed for mouth pain. 300 mL 1   meloxicam  (MOBIC ) 15 MG tablet Take 15 mg by mouth every morning.     metFORMIN (GLUCOPHAGE-XR) 500 MG 24 hr tablet Take 500 mg by mouth 2 (two) times daily.     nitroGLYCERIN  (NITROSTAT ) 0.4 MG SL tablet Place 0.4 mg under the tongue every 5 (five) minutes as needed for chest pain.     ofloxacin  (OCUFLOX ) 0.3 % ophthalmic solution Place 4 drops into the left ear 2 (two) times daily. 5 mL 5   omeprazole  (PRILOSEC ) 20 MG capsule Take 20 mg by mouth daily.     ondansetron  (ZOFRAN ) 8 MG tablet Take 1 tablet (8 mg total) by mouth every 8 (eight) hours as needed for nausea or vomiting. Start on third day after chemotherapy. 30 tablet 1   pregabalin (LYRICA) 300 MG capsule Take 300 mg by mouth 2 (two) times daily.     prochlorperazine  (COMPAZINE ) 10 MG tablet Take 1 tablet (10 mg total) by mouth every 6 (six) hours as needed for nausea or vomiting (Nausea or  vomiting). 30 tablet 1   revefenacin (YUPELRI) 175 MCG/3ML nebulizer solution Take 3 mLs (175 mcg total) by nebulization daily. 90 mL 11   rosuvastatin (CRESTOR) 20 MG tablet Take 20 mg by mouth daily.     spironolactone (ALDACTONE) 25 MG tablet Take 12.5 mg by mouth daily.     tamsulosin (FLOMAX) 0.4 MG CAPS capsule Take 0.4 mg by mouth daily after supper.     valsartan (DIOVAN) 320 MG tablet Take 320 mg by mouth daily.     doxycycline (ADOXA) 100 MG tablet Take 100 mg by mouth 2 (two) times daily. (Patient not taking: Reported on 07/01/2024)     No current facility-administered medications for this visit.    Review of Systems  Constitutional:  Negative for appetite change, chills, fatigue and fever.  HENT:   Negative for hearing loss and voice change.   Eyes:  Negative for eye problems and icterus.  Respiratory:  Negative for chest tightness, cough and shortness of breath.   Cardiovascular:  Negative for chest pain and leg swelling.  Gastrointestinal:  Negative for abdominal distention and abdominal pain.  Endocrine: Negative for hot flashes.  Genitourinary:  Negative for difficulty urinating, dysuria and frequency.   Musculoskeletal:  Positive for arthralgias.       Right shoulder pain with limited motion  Skin:  Negative for itching and rash.  Neurological:  Negative for light-headedness and numbness.  Hematological:  Negative for adenopathy. Does not bruise/bleed easily.  Psychiatric/Behavioral:  Negative for confusion.      PHYSICAL EXAMINATION: ECOG PERFORMANCE STATUS: 0 - Asymptomatic  Vitals:   10/07/24 1012  BP: 139/69  Pulse: 75  Resp: 18  Temp: (!) 97 F (36.1 C)  SpO2: 98%   Filed Weights   10/07/24 1012  Weight: 185 lb (83.9 kg)    Physical Exam Constitutional:      General: He is not in acute distress.    Appearance: He is not diaphoretic.  HENT:     Head: Normocephalic and atraumatic.  Eyes:     General: No scleral icterus. Cardiovascular:     Rate  and Rhythm: Normal rate.  Pulmonary:     Effort: Pulmonary effort is normal. No respiratory distress.     Comments: Decreased breath sound bilaterally Abdominal:     Tenderness: There is no abdominal tenderness.  Musculoskeletal:     Cervical back: Normal range of motion and neck supple.  Skin:    Findings: No erythema or rash.  Neurological:     Mental Status: He is alert and oriented to person, place, and time. Mental status is at baseline.     Motor: No abnormal muscle tone.  Psychiatric:        Mood and Affect: Mood and affect normal.      LABORATORY DATA:  I have reviewed the data as listed    Latest Ref Rng & Units 06/30/2024    9:47 AM 05/11/2024    8:06 AM 04/20/2024    9:03 AM  CBC  WBC 4.0 - 10.5 K/uL 8.5  10.4  11.5   Hemoglobin 13.0 - 17.0 g/dL 86.5  89.0  88.4   Hematocrit 39.0 - 52.0 % 40.4  32.6  34.2   Platelets 150 - 400 K/uL 163  144  145       Latest Ref Rng & Units 06/30/2024    9:47 AM 05/11/2024    8:06 AM 04/20/2024    9:03 AM  CMP  Glucose 70 - 99 mg/dL 771  688  651   BUN 8 - 23 mg/dL 13  18  18    Creatinine 0.61 - 1.24 mg/dL 8.81  9.13  9.04   Sodium 135 - 145 mmol/L 135  136  135   Potassium 3.5 - 5.1 mmol/L 3.7  4.5  3.9   Chloride 98 - 111 mmol/L 102  103  102   CO2 22 - 32 mmol/L 23  23  23    Calcium  8.9 - 10.3 mg/dL 9.4  9.3  8.2   Total Protein 6.5 - 8.1 g/dL 7.5  6.6  6.6   Total Bilirubin 0.0 - 1.2 mg/dL 0.8  0.6  0.6   Alkaline Phos 38 - 126 U/L 110  120  135   AST 15 - 41 U/L 42  30  38   ALT 0 - 44 U/L 33  34  49      RADIOGRAPHIC STUDIES: I have personally reviewed the radiological images as listed and agreed with the findings in the report. CT Chest Wo Contrast Result Date: 10/05/2024 CLINICAL DATA:  Follow-up lung cancer EXAM: CT CHEST WITHOUT CONTRAST TECHNIQUE: Multidetector CT imaging of the chest was performed following the standard protocol without IV contrast. RADIATION DOSE REDUCTION: This exam was performed according to  the departmental dose-optimization program which includes automated exposure control, adjustment of the mA and/or kV according to patient size and/or use of iterative reconstruction technique. COMPARISON:  Chest CT 06/21/2024, 05/12/2024, 12/24/2023, PET CT 12/24/2023, 02/19/2023 FINDINGS: Cardiovascular: Limited without intravenous contrast. Aortic atherosclerosis. Ascending aortic diameter of 3.9 cm. Post CABG changes. Multi-vessel coronary vascular calcification.  Normal cardiac size. No pericardial effusion Mediastinum/Nodes: Patent trachea. No thyroid mass. Multiple subcentimeter prevascular lymph nodes as before. Limited assessment for hilar nodes without contrast. No enlarging lymph nodes. Esophagus unremarkable. Lungs/Pleura: Emphysema. No pleural effusion or pneumothorax. Interval development of mild distortion and bandlike density within the lingula/left upper lobe, felt consistent with post radiation changes. The previously measured nodular focus at the site of prior lingular nodule is not confidently identified. Other scattered small pulmonary nodules are unchanged. Sub solid nodules most evident in the right lower lobe are also stable, measuring up to 13 mm on series 4, image 106. Upper Abdomen: Liver cirrhosis.  No acute finding Musculoskeletal: No acute or suspicious osseous abnormality. Sternotomy changes. IMPRESSION: 1. Interval development of mild distortion and bandlike density within the lingula/left upper lobe, felt consistent with post radiation changes. The previously measured nodular focus at the site of prior lingular nodule is not confidently identified. 2. Other scattered small pulmonary nodules are unchanged. Sub solid nodules most evident in the right lower lobe are also stable. Continued attention on chest CT follow-up. 3. Emphysema. 4. Liver cirrhosis. 5. Aortic atherosclerosis. Aortic Atherosclerosis (ICD10-I70.0) and Emphysema (ICD10-J43.9). Electronically Signed   By: Luke Bun  M.D.   On: 10/05/2024 18:54

## 2024-10-07 NOTE — Assessment & Plan Note (Signed)
 Recommend smoke cessation.

## 2024-10-07 NOTE — Assessment & Plan Note (Addendum)
 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 Not a surgical candidate.  S/p SBRT. S/p 4 cycles of adjuvant carboplatin  and Etoposide   09/30/2024 CT chest wo contrast. No recurrence.  Recommend patient to follow up in 6 months, repeat CT chest.

## 2024-10-08 ENCOUNTER — Encounter: Payer: Self-pay | Admitting: Radiation Oncology

## 2024-11-29 ENCOUNTER — Ambulatory Visit: Admitting: Student in an Organized Health Care Education/Training Program

## 2024-11-29 ENCOUNTER — Encounter: Payer: Self-pay | Admitting: Student in an Organized Health Care Education/Training Program

## 2024-11-29 VITALS — BP 104/68 | HR 79 | Temp 98.1°F | Ht 71.0 in | Wt 188.0 lb

## 2024-11-29 DIAGNOSIS — C7A090 Malignant carcinoid tumor of the bronchus and lung: Secondary | ICD-10-CM

## 2024-11-29 DIAGNOSIS — J449 Chronic obstructive pulmonary disease, unspecified: Secondary | ICD-10-CM

## 2024-11-29 DIAGNOSIS — C7A1 Malignant poorly differentiated neuroendocrine tumors: Secondary | ICD-10-CM

## 2024-11-29 DIAGNOSIS — J439 Emphysema, unspecified: Secondary | ICD-10-CM

## 2024-11-29 DIAGNOSIS — F17210 Nicotine dependence, cigarettes, uncomplicated: Secondary | ICD-10-CM

## 2024-11-29 DIAGNOSIS — Z720 Tobacco use: Secondary | ICD-10-CM

## 2024-11-29 MED ORDER — BUDESONIDE-FORMOTEROL FUMARATE 160-4.5 MCG/ACT IN AERO: 2.0000 | INHALATION_SPRAY | Freq: Two times a day (BID) | RESPIRATORY_TRACT | 12 refills | Status: AC

## 2024-11-29 MED ORDER — REVEFENACIN 175 MCG/3ML IN SOLN: 175.0000 ug | Freq: Every day | RESPIRATORY_TRACT | 11 refills | Status: DC

## 2024-11-29 MED ORDER — IPRATROPIUM-ALBUTEROL 0.5-2.5 (3) MG/3ML IN SOLN: 3.0000 mL | Freq: Four times a day (QID) | RESPIRATORY_TRACT | 11 refills | Status: AC | PRN

## 2024-11-29 NOTE — Progress Notes (Signed)
 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 Not a candidate for surgical resection given poor lung function. He is s/p SBRT and four cycles of adjuvant chemotherapy with carboplatin  and etoposide . Recent CT scan showed no recurrence. No metastatic disease on brain imaging. Continues to follow with oncology and rad/onc.  #COPD  Long standing history of COPD with chronic bronchitis and emphysema. PFT's showed FEV1/FVC of 0.63, FEV1 73% predicted, and a markedly reduced DLCO. Reports exertional dyspnea and daily productive cough. Wheezing noted at night but exam today with clear lungs. He is on Symbicort  which helps with symptoms, but we is unable to afford any inhaler with LAMA. Trialing Yupelri  again but this might not be covered by his insurance. Will also send a prescription for Duo-Nebs.  - Refilled Symbicort  inhaler. - Resent prescription for nebulizer and Duo-Nebs for as-needed use up to four times a day. - Encouraged smoking cessation to prevent further deterioration of lung function. - revefenacin  (YUPELRI ) 175 MCG/3ML nebulizer solution; Take 3 mLs (175 mcg total) by nebulization daily.  Dispense: 90 mL; Refill: 11 > not covered by insurance - AMB REFERRAL FOR DME - ipratropium-albuterol  (DUONEB) 0.5-2.5 (3) MG/3ML SOLN; Take 3 mLs by nebulization every 6 (six) hours as needed.  Dispense: 360 mL; Refill: 11 - budesonide -formoterol  (SYMBICORT ) 160-4.5 MCG/ACT inhaler; Inhale 2 puffs into the lungs in the morning and at bedtime.  Dispense: 1 each; Refill: 12  #Tobacco use disorder  Continues to smoke approximately one pack per day, reduced from two to two and a half packs. Uses marijuana. Acknowledges difficulty quitting smoking.  - Encouraged continued efforts to quit smoking. - Discussed potential  benefits of smoking cessation on respiratory health.   Return in about 1 year (around 11/29/2025).  Shane November, MD [facility]  I spent 33 minutes caring for this patient today, including preparing to see the patient, obtaining a medical history , reviewing a separately obtained history, performing a medically appropriate examination and/or evaluation, counseling and educating the patient/family/caregiver, ordering medications, tests, or procedures, documenting clinical information in the electronic health record, and independently interpreting results (not separately reported/billed) and communicating results to the patient/family/caregiver  End of visit medications:  Meds ordered this encounter  Medications   revefenacin  (YUPELRI ) 175 MCG/3ML nebulizer solution    Sig: Take 3 mLs (175 mcg total) by nebulization daily.    Dispense:  90 mL    Refill:  11    File under Medicare Part B. Dx: J44.9   ipratropium-albuterol  (DUONEB) 0.5-2.5 (3) MG/3ML SOLN    Sig: Take 3 mLs by nebulization every 6 (six) hours as needed.    Dispense:  360 mL    Refill:  11   budesonide -formoterol  (SYMBICORT ) 160-4.5 MCG/ACT inhaler    Sig: Inhale 2 puffs into the lungs in the morning and at bedtime.    Dispense:  1 each    Refill:  12    Current Medications[1]   Subjective:   PATIENT ID: [name] GENDER: male DOB: 1956/01/01, MRN: [id number]  Chief Complaint  Patient presents with   COPD    Cough with brown phlegm. Shortness of breath on exertion. Wheezing.     HPI  Discussed the use of AI scribe software for clinical note transcription with the patient, who gave verbal consent to  proceed.  History of Present Illness  [name] is a 69 year old male with a high grade neuroendocrine tumor of the lung and COPD who presents for follow-up.  Return Visit 01/07/2024:  Patient was enrolled in low-dose CT for lung cancer screening by his primary care physician.   He had his initial CT scan in March 2024 that showed multiple subcentimeter pulmonary nodules with plans for repeat CT at 66-month mark.  Repeat CT was performed on 11/25/2023 showing enlargement of the left upper lobe pulmonary nodule, measuring 4.4 cm. He underwent robotic assisted navigational bronchoscopy on 12/25/2023 with pathology showing high grade neuro-endocrine carcinoma of the lung. EBUS to stations 7 and 11L were negative for malignancy. PET/CT only showed the LUL nodule to be metabolically active (right shoulder does have metabolic activity though suspect this is no related to malignancy but arthroplasty). He has MRI of the brain scheduled for tomorrow and visit with thoracic surgery on 01/09/2024.   Patient reports a chronic cough that has been ongoing for years, sometimes productive of white/yellow sputum.  He has occasional exertional dyspnea.  Over the past few months, he has experienced night sweats as well as chills that have been around 2-3 times a week.  He has not had any weight changes nor has he experienced any rashes.  Return Visit 11/29/2024:  Initially seen in January 2025 for a left upper lobe pulmonary nodule measuring 4.4 cm, which was biopsied and diagnosed as a hybrid neuroendocrine tumor. He was referred to thoracic surgery and oncology for further management. Pulmonary function testing at that time showed an FEV1/FVC ratio of 0.63 with an FEV1 of 73% predicted. Due to a low DLCO of 35% predicted, he was referred to radiation oncology for stereotactic body radiation therapy (SBRT), which was performed in March 2025. He also underwent four cycles of adjuvant chemotherapy with carboplatin  and etoposide . A repeat CT scan in Wallace 2025 showed no recurrence of the tumor.  His breathing has improved compared to the previous year, although he experiences shortness of breath with significant exertion, such as 'pushing a wheelbarrow with wood.' He also has a persistent cough with  phlegm, which is not present all day, and wheezing at night, which has remained stable over the past year. He smokes a pack of cigarettes a day and uses marijuana occasionally. He has been attempting to reduce his smoking and has decreased from two and a half packs to one pack a day, using Zyn pouches to help manage cravings.  He is currently using Symbicort , two puffs in the morning and two in the evening, which he feels helps with both his cough and wheezing. He has not received a nebulizer that was previously prescribed and does not have a nebulizer machine at home.   Past medical history is notable for coronary artery disease, with bypass surgery performed 2007.  He is followed by [facility] cardiology.    Patient previously worked in sport and exercise psychologist, retired around 2015.  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  He has a significant smoking history, having started smoking at the age of 19 with 2 packs/day at his peak.  He did quit for 4 years after his open heart surgery.  He probably has between 55 and 60 pack years of smoking history. He is currently smoking 1 pack a day.   Ancillary information including prior medications, full medical/surgical/family/social histories, and PFTs (when available) are listed below and have been reviewed.  Review of Systems  Constitutional:  Negative for chills, fever, malaise/fatigue and weight loss.  Respiratory:  Positive for cough, sputum production, shortness of breath and wheezing. Negative for hemoptysis.   Cardiovascular:  Negative for chest pain.     Objective:   Vitals:   11/29/24 0957  BP: 104/68  Pulse: 79  Temp: 98.1 F (36.7 C)  TempSrc: Temporal  SpO2: 97%  Weight: 188 lb (85.3 kg)  Height: 5' 11 (1.803 m)   97% on RA BMI Readings from Last 3 Encounters:  11/29/24 26.22 kg/m  10/07/24 25.80 kg/m  10/07/24 25.80 kg/m   Wt Readings from Last 3 Encounters:  11/29/24 188 lb (85.3 kg)  10/07/24 185 lb  (83.9 kg)  10/07/24 185 lb (83.9 kg)    Physical Exam Constitutional:      Appearance: Normal appearance.  Cardiovascular:     Rate and Rhythm: Normal rate and regular rhythm.     Pulses: Normal pulses.     Heart sounds: Normal heart sounds.  Pulmonary:     Effort: Pulmonary effort is normal.     Breath sounds: No wheezing, rhonchi or rales.  Neurological:     General: No focal deficit present.     Mental Status: He is alert and oriented to person, place, and time. Mental status is at baseline.       Ancillary Information    Past Medical History:  Diagnosis Date   Anemia    Anginal pain    Arthritis    Atherosclerotic peripheral vascular disease with intermittent claudication    Bronchitis    h/o   CHF (congestive heart failure) (HCC)    Cirrhosis, non-alcoholic (HCC)    Coronary artery disease    Depression    Diabetes mellitus without complication (HCC)    diet controlled   Difficult intubation    Pt Denies   Dyspnea    with exertion   GERD (gastroesophageal reflux disease)    Heart failure with mid-range ejection fraction (HFmEF) (HCC)    History of MRSA infection 2010   Hx of CABG    Hyperlipidemia    Hypertension    Insomnia    Ischemic cardiomyopathy    Liver cirrhosis (HCC)    Peripheral vascular disease    Stomach ulcer    Stomach ulcer    Wears hearing aid in both ears      No family history on file.   Past Surgical History:  Procedure Laterality Date   ANTERIOR CERVICAL DECOMP/DISCECTOMY FUSION N/A 01/28/2018   Procedure: ANTERIOR CERVICAL DECOMPRESSION/DISCECTOMY FUSION 2 LEVELS C3-5;  Surgeon: Clois Fret, MD;  Location: [facility] ORS;  Service: Neurosurgery;  Laterality: N/A;   BRONCHIAL BIOPSY  12/25/2023   Procedure: BRONCHIAL BIOPSIES;  Surgeon: Isadora Hose, MD;  Location: [facility];  Service: Pulmonary;;   BRONCHIAL BRUSHINGS  12/25/2023   Procedure: BRONCHIAL BRUSHINGS;  Surgeon: Isadora Hose, MD;  Location: [facility] ENDOSCOPY;   Service: Pulmonary;;   BRONCHIAL NEEDLE ASPIRATION BIOPSY  12/25/2023   Procedure: BRONCHIAL NEEDLE ASPIRATION BIOPSIES;  Surgeon: Isadora Hose, MD;  Location: [facility];  Service: Pulmonary;;   BRONCHIAL WASHINGS  12/25/2023   Procedure: BRONCHIAL WASHINGS;  Surgeon: Isadora Hose, MD;  Location: [facility];  Service: Pulmonary;;   COLON SURGERY  2004   due to diverticulitis.  Partail colectomy with Anastamosis   COLONOSCOPY N/A 03/27/2023   Procedure: COLONOSCOPY;  Surgeon: Unk Corinn Skiff, MD;  Location: [facility] ENDOSCOPY;  Service: Gastroenterology;  Laterality: N/A;   COLONOSCOPY WITH  PROPOFOL  N/A 03/26/2023   Procedure: COLONOSCOPY WITH PROPOFOL ;  Surgeon: Unk Corinn Skiff, MD;  Location: [facility] ENDOSCOPY;  Service: Gastroenterology;  Laterality: N/A;   CORONARY ARTERY BYPASS GRAFT     ESOPHAGOGASTRODUODENOSCOPY (EGD) WITH PROPOFOL  N/A 03/26/2023   Procedure: ESOPHAGOGASTRODUODENOSCOPY (EGD) WITH PROPOFOL ;  Surgeon: Unk Corinn Skiff, MD;  Location: [facility] ENDOSCOPY;  Service: Gastroenterology;  Laterality: N/A;   FINE NEEDLE ASPIRATION  12/25/2023   Procedure: FINE NEEDLE ASPIRATION (FNA) LINEAR;  Surgeon: Isadora Hose, MD;  Location: [facility];  Service: Pulmonary;;   HAND TENDON SURGERY Right    right thumb   LUMBAR LAMINECTOMY/DECOMPRESSION MICRODISCECTOMY N/A 09/01/2019   Procedure: LUMBAR LAMINECTOMY/DECOMPRESSION MICRODISCECTOMY 3 LEVELS L2-L5;  Surgeon: Clois Fret, MD;  Location: [facility];  Service: Neurosurgery;  Laterality: N/A;   neck fusion     SHOULDER ARTHROSCOPY WITH OPEN ROTATOR CUFF REPAIR Right 04/08/2016   Procedure: SHOULDER ARTHROSCOPY WITH OPEN ROTATOR CUFF REPAIR;  Surgeon: Kayla Pinal, MD;  Location: [facility] ORS;  Service: Orthopedics;  Laterality: Right;   TONSILLECTOMY     TRANSFORAMINAL LUMBAR INTERBODY FUSION W/ MIS 1 LEVEL N/A 09/01/2019   Procedure: MINIMALLY INVASIVE (MIS) TRANSFORAMINAL LUMBAR INTERBODY FUSION (TLIF) 1 LEVEL L5-S1;   Surgeon: Clois Fret, MD;  Location: [facility];  Service: Neurosurgery;  Laterality: N/A;   TYMPANOPLASTY Right 08/19/2023   Procedure: TYMPANOPLASTY WITH REMOVAL OF CHOLESTEATOMA;  Surgeon: Blair Mt, MD;  [facility];  Service: ENT;  Laterality: Right;   VIDEO BRONCHOSCOPY WITH ENDOBRONCHIAL ULTRASOUND  12/25/2023   Procedure: VIDEO BRONCHOSCOPY WITH ENDOBRONCHIAL ULTRASOUND;  Surgeon: Isadora Hose, MD;  Location: [facility];  Service: Pulmonary;;   VIDEO BRONCHOSCOPY WITH RADIAL ENDOBRONCHIAL ULTRASOUND  12/25/2023   Procedure: VIDEO BRONCHOSCOPY WITH RADIAL ENDOBRONCHIAL ULTRASOUND;  Surgeon: Isadora Hose, MD;  Location: [facility];  Service: Pulmonary;;    Social History   Socioeconomic History   Marital status: Married    Spouse name: Not on file   Number of children: Not on file   Years of education: Not on file   Highest education level: Not on file  Occupational History   Not on file  Tobacco Use   Smoking status: Every Day    Current packs/day: 1.00    Average packs/day: 1 pack/day for 51.1 years (51.1 ttl pk-yrs)    Types: Cigarettes    Start date: 02/08/1968    Last attempt to quit: 02/07/2018   Smokeless tobacco: Never   Tobacco comments:    1PPD khj 01/07/2024  Vaping Use   Vaping status: Former  Substance and Sexual Activity   Alcohol use: Not Currently    Comment: rarely   Drug use: No   Sexual activity: Yes  Other Topics Concern   Not on file  Social History Narrative   Not on file   Social Drivers of Health   Tobacco Use: High Risk (11/29/2024)   Patient History    Smoking Tobacco Use: Every Day    Smokeless Tobacco Use: Never    Passive Exposure: Not on file  Financial Resource Strain: Not on file  Food Insecurity: No Food Insecurity (01/06/2024)   Hunger Vital Sign    Worried About Running Out of Food in the Last Year: Never true    Ran Out of Food in the Last Year: Never true  Transportation Needs: No  Transportation Needs (01/06/2024)   PRAPARE - Administrator, Civil Service (Medical): No    Lack of Transportation (Non-Medical): No  Physical Activity: Not on  file  Stress: No Stress Concern Present (01/06/2024)   [employer] of Occupational Health - Occupational Stress Questionnaire    Feeling of Stress : Not at all  Social Connections: Not on file  Intimate Partner Violence: Not At Risk (01/06/2024)   Humiliation, Afraid, Rape, and Kick questionnaire    Fear of Current or Ex-Partner: No    Emotionally Abused: No    Physically Abused: No    Sexually Abused: No  Depression (PHQ2-9): Low Risk (10/07/2024)   Depression (PHQ2-9)    PHQ-2 Score: 0  Alcohol Screen: Not on file  Housing: Unknown (01/06/2024)   Housing Stability Vital Sign    Unable to Pay for Housing in the Last Year: No    Number of Times Moved in the Last Year: Not on file    Homeless in the Last Year: No  Utilities: Not At Risk (01/06/2024)   AHC Utilities    Threatened with loss of utilities: No  Health Literacy: Adequate Health Literacy (01/06/2024)   B1300 Health Literacy    Frequency of need for help with medical instructions: Never     Allergies[2]   CBC    Component Value Date/Time   WBC 8.5 06/30/2024 0947   WBC 9.7 12/25/2023 0744   RBC 3.82 (L) 06/30/2024 0947   HGB 13.4 06/30/2024 0947   HGB 15.3 03/12/2023 1332   HCT 40.4 06/30/2024 0947   HCT 45.5 03/12/2023 1332   PLT 163 06/30/2024 0947   PLT 171 03/12/2023 1332   MCV 105.8 (H) 06/30/2024 0947   MCV 96 03/12/2023 1332   MCH 35.1 (H) 06/30/2024 0947   MCHC 33.2 06/30/2024 0947   RDW 12.6 06/30/2024 0947   RDW 12.3 03/12/2023 1332   LYMPHSABS 2.1 06/30/2024 0947   MONOABS 0.5 06/30/2024 0947   EOSABS 0.2 06/30/2024 0947   BASOSABS 0.1 06/30/2024 0947    Pulmonary Functions Testing Results:    Latest Ref Rng & Units 01/01/2024    3:50 PM  PFT Results  FVC-Pre L 4.09   FVC-Predicted Pre % 86   FVC-Post L 4.14    FVC-Predicted Post % 87   Pre FEV1/FVC % % 63   Post FEV1/FCV % % 58   FEV1-Pre L 2.59   FEV1-Predicted Pre % 73   FEV1-Post L 2.40   DLCO uncorrected ml/min/mmHg 9.60   DLCO UNC% % 35   DLVA Predicted % 37   TLC L 6.64   TLC % Predicted % 92   RV % Predicted % 80     Outpatient Medications Prior to Visit  Medication Sig Dispense Refill   ACCU-CHEK GUIDE TEST test strip      Accu-Chek Softclix Lancets lancets SMARTSIG:Topical     albuterol  (VENTOLIN  HFA) 108 (90 Base) MCG/ACT inhaler Inhale 1-2 puffs into the lungs every 6 (six) hours as needed for wheezing or shortness of breath.     amitriptyline  (ELAVIL ) 50 MG tablet Take 50 mg by mouth at bedtime.     amLODipine (NORVASC) 2.5 MG tablet Take 1 tablet by mouth at bedtime.     aspirin 325 MG tablet Take 325 mg by mouth daily.     Blood Glucose Monitoring Suppl (ACCU-CHEK GUIDE ME) w/Device KIT      calcium -vitamin D  (OSCAL WITH D) 500-5 MG-MCG tablet Take 2 tablets by mouth daily. 60 tablet 2   citalopram  (CELEXA ) 40 MG tablet Take 40 mg by mouth every morning.      DULoxetine (CYMBALTA) 60 MG capsule Take  60 mg by mouth 2 (two) times daily.     FARXIGA 5 MG TABS tablet Take 5 mg by mouth daily.     fluticasone (FLONASE) 50 MCG/ACT nasal spray Place 2 sprays into both nostrils daily.     isosorbide mononitrate (IMDUR) 30 MG 24 hr tablet Take 30 mg by mouth daily.     lansoprazole (PREVACID) 15 MG capsule Take 15 mg by mouth daily at 12 noon.     loratadine (CLARITIN) 10 MG tablet Take 10 mg by mouth daily.     meloxicam  (MOBIC ) 15 MG tablet Take 15 mg by mouth every morning.     metFORMIN (GLUCOPHAGE-XR) 500 MG 24 hr tablet Take 500 mg by mouth 2 (two) times daily.     nitroGLYCERIN  (NITROSTAT ) 0.4 MG SL tablet Place 0.4 mg under the tongue every 5 (five) minutes as needed for chest pain.     pregabalin (LYRICA) 300 MG capsule Take 300 mg by mouth 2 (two) times daily.     rosuvastatin (CRESTOR) 20 MG tablet Take 20 mg by mouth  daily.     spironolactone (ALDACTONE) 25 MG tablet Take 12.5 mg by mouth daily.     tamsulosin (FLOMAX) 0.4 MG CAPS capsule Take 0.4 mg by mouth daily after supper.     valsartan (DIOVAN) 320 MG tablet Take 320 mg by mouth daily.     budesonide -formoterol  (SYMBICORT ) 160-4.5 MCG/ACT inhaler Inhale 2 puffs into the lungs in the morning and at bedtime. 1 each 12   dexamethasone  (DECADRON ) 4 MG tablet Take 2 tablets daily for 2 days, on days 4 and 5.Take with food. Every 21 days. (Patient not taking: Reported on 11/29/2024) 30 tablet 1   omeprazole  (PRILOSEC ) 20 MG capsule Take 20 mg by mouth daily.     ondansetron  (ZOFRAN ) 8 MG tablet Take 1 tablet (8 mg total) by mouth every 8 (eight) hours as needed for nausea or vomiting. Start on third day after chemotherapy. (Patient not taking: Reported on 11/29/2024) 30 tablet 1   ciprofloxacin -dexamethasone  (CIPRODEX ) OTIC suspension Place 4 drops into both ears 2 (two) times daily for 5 days. DOS 08/19/23. (Patient not taking: Reported on 11/29/2024) 7.5 mL 0   doxycycline (ADOXA) 100 MG tablet Take 100 mg by mouth 2 (two) times daily. (Patient not taking: Reported on 11/29/2024)     magic mouthwash (nystatin , lidocaine , diphenhydrAMINE, alum & mag hydroxide) suspension Take 5 mLs by mouth 3 (three) times daily as needed for mouth pain. (Patient not taking: Reported on 11/29/2024) 300 mL 1   ofloxacin  (OCUFLOX ) 0.3 % ophthalmic solution Place 4 drops into the left ear 2 (two) times daily. 5 mL 5   prochlorperazine  (COMPAZINE ) 10 MG tablet Take 1 tablet (10 mg total) by mouth every 6 (six) hours as needed for nausea or vomiting (Nausea or vomiting). (Patient not taking: Reported on 11/29/2024) 30 tablet 1   revefenacin  (YUPELRI ) 175 MCG/3ML nebulizer solution Take 3 mLs (175 mcg total) by nebulization daily. (Patient not taking: Reported on 11/29/2024) 90 mL 11   No facility-administered medications prior to visit.      [1]  Current Outpatient Medications:    ACCU-CHEK  GUIDE TEST test strip, , Disp: , Rfl:    Accu-Chek Softclix Lancets lancets, SMARTSIG:Topical, Disp: , Rfl:    albuterol  (VENTOLIN  HFA) 108 (90 Base) MCG/ACT inhaler, Inhale 1-2 puffs into the lungs every 6 (six) hours as needed for wheezing or shortness of breath., Disp: , Rfl:    amitriptyline  (ELAVIL ) 50  MG tablet, Take 50 mg by mouth at bedtime., Disp: , Rfl:    amLODipine (NORVASC) 2.5 MG tablet, Take 1 tablet by mouth at bedtime., Disp: , Rfl:    aspirin 325 MG tablet, Take 325 mg by mouth daily., Disp: , Rfl:    Blood Glucose Monitoring Suppl (ACCU-CHEK GUIDE ME) w/Device KIT, , Disp: , Rfl:    calcium -vitamin D  (OSCAL WITH D) 500-5 MG-MCG tablet, Take 2 tablets by mouth daily., Disp: 60 tablet, Rfl: 2   citalopram  (CELEXA ) 40 MG tablet, Take 40 mg by mouth every morning. , Disp: , Rfl:    DULoxetine (CYMBALTA) 60 MG capsule, Take 60 mg by mouth 2 (two) times daily., Disp: , Rfl:    FARXIGA 5 MG TABS tablet, Take 5 mg by mouth daily., Disp: , Rfl:    fluticasone (FLONASE) 50 MCG/ACT nasal spray, Place 2 sprays into both nostrils daily., Disp: , Rfl:    ipratropium-albuterol  (DUONEB) 0.5-2.5 (3) MG/3ML SOLN, Take 3 mLs by nebulization every 6 (six) hours as needed., Disp: 360 mL, Rfl: 11   isosorbide mononitrate (IMDUR) 30 MG 24 hr tablet, Take 30 mg by mouth daily., Disp: , Rfl:    lansoprazole (PREVACID) 15 MG capsule, Take 15 mg by mouth daily at 12 noon., Disp: , Rfl:    loratadine (CLARITIN) 10 MG tablet, Take 10 mg by mouth daily., Disp: , Rfl:    meloxicam  (MOBIC ) 15 MG tablet, Take 15 mg by mouth every morning., Disp: , Rfl:    metFORMIN (GLUCOPHAGE-XR) 500 MG 24 hr tablet, Take 500 mg by mouth 2 (two) times daily., Disp: , Rfl:    nitroGLYCERIN  (NITROSTAT ) 0.4 MG SL tablet, Place 0.4 mg under the tongue every 5 (five) minutes as needed for chest pain., Disp: , Rfl:    pregabalin (LYRICA) 300 MG capsule, Take 300 mg by mouth 2 (two) times daily., Disp: , Rfl:    rosuvastatin  (CRESTOR) 20 MG tablet, Take 20 mg by mouth daily., Disp: , Rfl:    spironolactone (ALDACTONE) 25 MG tablet, Take 12.5 mg by mouth daily., Disp: , Rfl:    tamsulosin (FLOMAX) 0.4 MG CAPS capsule, Take 0.4 mg by mouth daily after supper., Disp: , Rfl:    valsartan (DIOVAN) 320 MG tablet, Take 320 mg by mouth daily., Disp: , Rfl:    budesonide -formoterol  (SYMBICORT ) 160-4.5 MCG/ACT inhaler, Inhale 2 puffs into the lungs in the morning and at bedtime., Disp: 1 each, Rfl: 12   dexamethasone  (DECADRON ) 4 MG tablet, Take 2 tablets daily for 2 days, on days 4 and 5.Take with food. Every 21 days. (Patient not taking: Reported on 11/29/2024), Disp: 30 tablet, Rfl: 1   omeprazole  (PRILOSEC ) 20 MG capsule, Take 20 mg by mouth daily., Disp: , Rfl:    ondansetron  (ZOFRAN ) 8 MG tablet, Take 1 tablet (8 mg total) by mouth every 8 (eight) hours as needed for nausea or vomiting. Start on third day after chemotherapy. (Patient not taking: Reported on 11/29/2024), Disp: 30 tablet, Rfl: 1   revefenacin  (YUPELRI ) 175 MCG/3ML nebulizer solution, Take 3 mLs (175 mcg total) by nebulization daily., Disp: 90 mL, Rfl: 11 [2]  Allergies Allergen Reactions   Penicillins Hives    Has patient had a PCN reaction causing immediate rash, facial/tongue/throat swelling, SOB or lightheadedness with hypotension: Yes Has patient had a PCN reaction causing severe rash involving mucus membranes or skin necrosis: Unknown Has patient had a PCN reaction that required hospitalization: Yes Has patient had a PCN reaction  occurring within the last 10 years: No If all of the above answers are NO, then may proceed with Cephalosporin use.    Atorvastatin Other (See Comments)    Body Aches   "

## 2024-11-29 NOTE — Patient Instructions (Addendum)
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 We reviewed your recent CT scan, which showed no recurrence of the tumor, and discussed your current symptoms and treatment plan.  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 Your recent CT scan showed no recurrence of the tumor, which is good news. We will continue to monitor your condition with regular follow-ups and imaging studies.  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 You reported shortness of breath with exertion, a daily productive cough, and nighttime wheezing. We have refilled your Symbicort  inhaler prescription and resent the prescription for a nebulizer and DuoNap for use up to four times a day as needed. It's important to continue using these medications as directed to manage your symptoms. We also strongly encourage you to quit smoking to prevent further damage to your lungs.  -TOBACCO USE DISORDER: This refers to the addiction to tobacco products. You have reduced your smoking from two and a half packs to one pack a day, which is a positive step. We encourage you to continue your efforts to quit smoking entirely, as this will significantly benefit your respiratory health. We discussed the potential benefits of smoking cessation and encourage you to keep using strategies like Zyn pouches to manage cravings.  INSTRUCTIONS: Please continue using your Symbicort  inhaler as prescribed, and start using the nebulizer and DuoNap as needed up to four times a day once you receive the nebulizer machine. We will schedule regular follow-ups and imaging studies to monitor your lung condition. Additionally, please  continue your efforts to quit smoking and consider seeking support if needed.          Contains text generated by [employer].  "

## 2025-03-30 ENCOUNTER — Other Ambulatory Visit

## 2025-04-06 ENCOUNTER — Inpatient Hospital Stay: Admitting: Oncology

## 2025-04-06 ENCOUNTER — Inpatient Hospital Stay

## 2025-04-06 ENCOUNTER — Ambulatory Visit: Admitting: Radiation Oncology

## 2025-06-30 ENCOUNTER — Other Ambulatory Visit
